# Patient Record
Sex: Female | Born: 1975
Health system: Southern US, Community
[De-identification: ages and names within clinical notes are randomized; demographics above are authoritative.]

## PROBLEM LIST (undated history)

## (undated) DIAGNOSIS — F99 Mental disorder, not otherwise specified: Secondary | ICD-10-CM

## (undated) DIAGNOSIS — Z789 Other specified health status: Secondary | ICD-10-CM

## (undated) DIAGNOSIS — R112 Nausea with vomiting, unspecified: Secondary | ICD-10-CM

## (undated) DIAGNOSIS — O99345 Other mental disorders complicating the puerperium: Secondary | ICD-10-CM

## (undated) DIAGNOSIS — K429 Umbilical hernia without obstruction or gangrene: Secondary | ICD-10-CM

## (undated) DIAGNOSIS — E039 Hypothyroidism, unspecified: Secondary | ICD-10-CM

## (undated) DIAGNOSIS — E785 Hyperlipidemia, unspecified: Secondary | ICD-10-CM

## (undated) DIAGNOSIS — I959 Hypotension, unspecified: Secondary | ICD-10-CM

## (undated) DIAGNOSIS — K219 Gastro-esophageal reflux disease without esophagitis: Secondary | ICD-10-CM

## (undated) DIAGNOSIS — F53 Postpartum depression: Secondary | ICD-10-CM

## (undated) DIAGNOSIS — R7303 Prediabetes: Secondary | ICD-10-CM

## (undated) HISTORY — DX: Gastro-esophageal reflux disease without esophagitis: K21.9

## (undated) HISTORY — PX: NO PAST SURGERIES: SHX2092

## (undated) HISTORY — DX: Hypothyroidism, unspecified: E03.9

## (undated) HISTORY — DX: Hyperlipidemia, unspecified: E78.5

---

## 1999-12-19 ENCOUNTER — Emergency Department (HOSPITAL_COMMUNITY): Admission: EM | Admit: 1999-12-19 | Discharge: 1999-12-20 | Payer: Self-pay | Admitting: Emergency Medicine

## 1999-12-20 ENCOUNTER — Encounter: Payer: Self-pay | Admitting: Emergency Medicine

## 2000-01-14 ENCOUNTER — Other Ambulatory Visit: Admission: RE | Admit: 2000-01-14 | Discharge: 2000-01-14 | Payer: Self-pay | Admitting: Internal Medicine

## 2000-08-16 DIAGNOSIS — F53 Postpartum depression: Secondary | ICD-10-CM

## 2000-08-16 HISTORY — DX: Postpartum depression: F53.0

## 2000-11-09 ENCOUNTER — Inpatient Hospital Stay (HOSPITAL_COMMUNITY): Admission: AD | Admit: 2000-11-09 | Discharge: 2000-11-12 | Payer: Self-pay | Admitting: Obstetrics

## 2002-12-11 ENCOUNTER — Ambulatory Visit (HOSPITAL_COMMUNITY): Admission: RE | Admit: 2002-12-11 | Discharge: 2002-12-11 | Payer: Self-pay | Admitting: Family Medicine

## 2002-12-11 ENCOUNTER — Encounter: Payer: Self-pay | Admitting: Family Medicine

## 2003-04-29 ENCOUNTER — Encounter: Payer: Self-pay | Admitting: Family Medicine

## 2003-04-29 ENCOUNTER — Ambulatory Visit (HOSPITAL_COMMUNITY): Admission: RE | Admit: 2003-04-29 | Discharge: 2003-04-29 | Payer: Self-pay | Admitting: Family Medicine

## 2005-06-01 ENCOUNTER — Emergency Department (HOSPITAL_COMMUNITY): Admission: EM | Admit: 2005-06-01 | Discharge: 2005-06-01 | Payer: Self-pay | Admitting: Emergency Medicine

## 2006-05-26 ENCOUNTER — Ambulatory Visit: Payer: Self-pay | Admitting: Family Medicine

## 2006-06-03 ENCOUNTER — Ambulatory Visit: Payer: Self-pay | Admitting: Family Medicine

## 2006-06-06 ENCOUNTER — Ambulatory Visit (HOSPITAL_COMMUNITY): Admission: RE | Admit: 2006-06-06 | Discharge: 2006-06-06 | Payer: Self-pay | Admitting: Family Medicine

## 2006-06-30 ENCOUNTER — Ambulatory Visit: Payer: Self-pay | Admitting: Sports Medicine

## 2006-07-29 ENCOUNTER — Ambulatory Visit: Payer: Self-pay | Admitting: Sports Medicine

## 2006-08-05 ENCOUNTER — Ambulatory Visit (HOSPITAL_COMMUNITY): Admission: RE | Admit: 2006-08-05 | Discharge: 2006-08-05 | Payer: Self-pay | Admitting: Family Medicine

## 2006-08-23 ENCOUNTER — Ambulatory Visit: Payer: Self-pay | Admitting: Family Medicine

## 2006-10-07 ENCOUNTER — Ambulatory Visit: Payer: Self-pay | Admitting: Family Medicine

## 2006-10-14 ENCOUNTER — Encounter (INDEPENDENT_AMBULATORY_CARE_PROVIDER_SITE_OTHER): Payer: Self-pay | Admitting: Family Medicine

## 2006-10-14 ENCOUNTER — Ambulatory Visit: Payer: Self-pay | Admitting: Family Medicine

## 2006-11-03 ENCOUNTER — Ambulatory Visit: Payer: Self-pay | Admitting: Family Medicine

## 2006-11-03 DIAGNOSIS — R7989 Other specified abnormal findings of blood chemistry: Secondary | ICD-10-CM | POA: Insufficient documentation

## 2006-11-03 LAB — CONVERTED CEMR LAB
Blood in Urine, dipstick: NEGATIVE
Nitrite: NEGATIVE
Specific Gravity, Urine: 1.025
Urobilinogen, UA: 1

## 2006-11-08 ENCOUNTER — Ambulatory Visit: Payer: Self-pay | Admitting: Sports Medicine

## 2006-11-08 LAB — CONVERTED CEMR LAB: Blood Glucose, Fasting: 85 mg/dL

## 2006-11-21 ENCOUNTER — Ambulatory Visit: Payer: Self-pay | Admitting: Family Medicine

## 2006-11-23 ENCOUNTER — Inpatient Hospital Stay (HOSPITAL_COMMUNITY): Admission: AD | Admit: 2006-11-23 | Discharge: 2006-11-23 | Payer: Self-pay | Admitting: Obstetrics & Gynecology

## 2006-11-23 ENCOUNTER — Ambulatory Visit: Payer: Self-pay | Admitting: Obstetrics & Gynecology

## 2006-12-05 ENCOUNTER — Ambulatory Visit: Payer: Self-pay | Admitting: Family Medicine

## 2006-12-20 ENCOUNTER — Encounter: Payer: Self-pay | Admitting: *Deleted

## 2006-12-20 ENCOUNTER — Encounter (INDEPENDENT_AMBULATORY_CARE_PROVIDER_SITE_OTHER): Payer: Self-pay | Admitting: Family Medicine

## 2006-12-20 ENCOUNTER — Ambulatory Visit: Payer: Self-pay

## 2006-12-29 ENCOUNTER — Ambulatory Visit: Payer: Self-pay | Admitting: Sports Medicine

## 2007-01-06 ENCOUNTER — Ambulatory Visit: Payer: Self-pay | Admitting: Family Medicine

## 2007-01-11 ENCOUNTER — Ambulatory Visit: Payer: Self-pay | Admitting: Gynecology

## 2007-01-13 ENCOUNTER — Ambulatory Visit: Payer: Self-pay | Admitting: Family Medicine

## 2007-01-15 ENCOUNTER — Inpatient Hospital Stay (HOSPITAL_COMMUNITY): Admission: AD | Admit: 2007-01-15 | Discharge: 2007-01-17 | Payer: Self-pay | Admitting: Family Medicine

## 2007-01-15 ENCOUNTER — Ambulatory Visit: Payer: Self-pay | Admitting: Family Medicine

## 2007-01-17 ENCOUNTER — Ambulatory Visit: Payer: Self-pay | Admitting: Family Medicine

## 2007-03-02 ENCOUNTER — Ambulatory Visit: Payer: Self-pay | Admitting: Family Medicine

## 2007-03-02 DIAGNOSIS — M545 Low back pain: Secondary | ICD-10-CM | POA: Insufficient documentation

## 2007-03-02 DIAGNOSIS — H538 Other visual disturbances: Secondary | ICD-10-CM

## 2007-03-03 ENCOUNTER — Telehealth: Payer: Self-pay | Admitting: *Deleted

## 2007-03-07 ENCOUNTER — Encounter (INDEPENDENT_AMBULATORY_CARE_PROVIDER_SITE_OTHER): Payer: Self-pay | Admitting: Family Medicine

## 2008-05-31 ENCOUNTER — Ambulatory Visit (HOSPITAL_COMMUNITY): Admission: RE | Admit: 2008-05-31 | Discharge: 2008-05-31 | Payer: Self-pay | Admitting: Family Medicine

## 2008-06-14 ENCOUNTER — Ambulatory Visit (HOSPITAL_COMMUNITY): Admission: RE | Admit: 2008-06-14 | Discharge: 2008-06-14 | Payer: Self-pay | Admitting: Family Medicine

## 2008-09-24 ENCOUNTER — Ambulatory Visit: Payer: Self-pay | Admitting: Family Medicine

## 2008-09-24 ENCOUNTER — Inpatient Hospital Stay (HOSPITAL_COMMUNITY): Admission: AD | Admit: 2008-09-24 | Discharge: 2008-09-26 | Payer: Self-pay | Admitting: Obstetrics & Gynecology

## 2010-02-09 ENCOUNTER — Ambulatory Visit (HOSPITAL_COMMUNITY): Admission: RE | Admit: 2010-02-09 | Discharge: 2010-02-09 | Payer: Self-pay | Admitting: Family Medicine

## 2010-05-11 ENCOUNTER — Ambulatory Visit (HOSPITAL_COMMUNITY): Admission: RE | Admit: 2010-05-11 | Discharge: 2010-05-11 | Payer: Self-pay | Admitting: Family Medicine

## 2010-06-25 ENCOUNTER — Inpatient Hospital Stay (HOSPITAL_COMMUNITY): Admission: AD | Admit: 2010-06-25 | Discharge: 2010-06-27 | Payer: Self-pay | Admitting: Obstetrics & Gynecology

## 2010-06-25 ENCOUNTER — Ambulatory Visit: Payer: Self-pay | Admitting: Obstetrics and Gynecology

## 2010-10-02 ENCOUNTER — Encounter: Payer: Self-pay | Admitting: *Deleted

## 2010-10-27 LAB — CBC
HCT: 40.8 % (ref 36.0–46.0)
Hemoglobin: 11.3 g/dL — ABNORMAL LOW (ref 12.0–15.0)
MCH: 31.8 pg (ref 26.0–34.0)
MCV: 92.6 fL (ref 78.0–100.0)
Platelets: 212 10*3/uL (ref 150–400)
RBC: 3.54 MIL/uL — ABNORMAL LOW (ref 3.87–5.11)
RBC: 4.4 MIL/uL (ref 3.87–5.11)
WBC: 11.3 10*3/uL — ABNORMAL HIGH (ref 4.0–10.5)
WBC: 13.3 10*3/uL — ABNORMAL HIGH (ref 4.0–10.5)

## 2010-12-01 LAB — CBC
HCT: 34.5 % — ABNORMAL LOW (ref 36.0–46.0)
HCT: 39.6 % (ref 36.0–46.0)
Hemoglobin: 11.7 g/dL — ABNORMAL LOW (ref 12.0–15.0)
Hemoglobin: 13.3 g/dL (ref 12.0–15.0)
MCHC: 34 g/dL (ref 30.0–36.0)
MCV: 92.8 fL (ref 78.0–100.0)
MCV: 93.6 fL (ref 78.0–100.0)
Platelets: 236 10*3/uL (ref 150–400)
RBC: 3.69 MIL/uL — ABNORMAL LOW (ref 3.87–5.11)
RBC: 4.27 MIL/uL (ref 3.87–5.11)
RDW: 14.1 % (ref 11.5–15.5)
WBC: 10.4 10*3/uL (ref 4.0–10.5)

## 2011-02-24 ENCOUNTER — Emergency Department (HOSPITAL_COMMUNITY)
Admission: EM | Admit: 2011-02-24 | Discharge: 2011-02-24 | Disposition: A | Discharge status: Home / Self Care | Payer: Self-pay | Attending: Emergency Medicine | Admitting: Emergency Medicine

## 2011-02-24 ENCOUNTER — Emergency Department (HOSPITAL_COMMUNITY): Payer: Self-pay

## 2011-02-24 DIAGNOSIS — K219 Gastro-esophageal reflux disease without esophagitis: Secondary | ICD-10-CM | POA: Insufficient documentation

## 2011-02-24 DIAGNOSIS — R109 Unspecified abdominal pain: Secondary | ICD-10-CM | POA: Insufficient documentation

## 2011-02-24 LAB — URINALYSIS, ROUTINE W REFLEX MICROSCOPIC
Bilirubin Urine: NEGATIVE
Ketones, ur: NEGATIVE mg/dL
Protein, ur: NEGATIVE mg/dL
pH: 6.5 (ref 5.0–8.0)

## 2011-02-24 LAB — BASIC METABOLIC PANEL
BUN: 9 mg/dL (ref 6–23)
Calcium: 9 mg/dL (ref 8.4–10.5)
Creatinine, Ser: 0.7 mg/dL (ref 0.50–1.10)
GFR calc Af Amer: >60 mL/min (ref >60)

## 2011-02-24 LAB — CBC
MCH: 30 pg (ref 26.0–34.0)
MCHC: 34.8 g/dL (ref 30.0–36.0)
MCV: 86 fL (ref 78.0–100.0)
Platelets: 326 10*3/uL (ref 150–400)
RDW: 13.2 % (ref 11.5–15.5)

## 2011-02-24 LAB — WET PREP, GENITAL
Clue Cells Wet Prep HPF POC: NONE SEEN
Trich, Wet Prep: NONE SEEN
Yeast Wet Prep HPF POC: NONE SEEN

## 2011-02-24 LAB — URINE MICROSCOPIC-ADD ON

## 2011-02-24 LAB — POCT PREGNANCY, URINE: Preg Test, Ur: NEGATIVE

## 2011-02-24 MED ORDER — IOHEXOL 300 MG/ML  SOLN: 80.0000 mL | Freq: Once | INTRAMUSCULAR | Status: DC | PRN

## 2011-02-25 LAB — GC/CHLAMYDIA PROBE AMP, GENITAL: Chlamydia, DNA Probe: NEGATIVE

## 2011-02-25 LAB — URINE CULTURE
Colony Count: NO GROWTH
Culture  Setup Time: 201207111228
Culture: NO GROWTH

## 2011-06-03 LAB — CBC
MCV: 90
Platelets: 297
WBC: 10.7 — ABNORMAL HIGH

## 2011-06-03 LAB — RPR: RPR Ser Ql: NONREACTIVE

## 2012-10-06 LAB — OB RESULTS CONSOLE GC/CHLAMYDIA: Chlamydia: NEGATIVE

## 2012-11-21 LAB — OB RESULTS CONSOLE HEPATITIS B SURFACE ANTIGEN
Hepatitis B Surface Ag: NEGATIVE
Hepatitis B Surface Ag: NEGATIVE

## 2012-11-21 LAB — OB RESULTS CONSOLE ABO/RH
RH Type: POSITIVE
RH Type: POSITIVE

## 2012-11-21 LAB — OB RESULTS CONSOLE RUBELLA ANTIBODY, IGM: Rubella: IMMUNE

## 2012-11-21 LAB — OB RESULTS CONSOLE ANTIBODY SCREEN
Antibody Screen: NEGATIVE
Antibody Screen: NEGATIVE

## 2012-11-21 LAB — OB RESULTS CONSOLE HIV ANTIBODY (ROUTINE TESTING): HIV: NONREACTIVE

## 2012-11-21 LAB — OB RESULTS CONSOLE RPR: RPR: NONREACTIVE

## 2013-03-05 LAB — OB RESULTS CONSOLE GBS: GBS: NEGATIVE

## 2013-03-19 ENCOUNTER — Other Ambulatory Visit (HOSPITAL_COMMUNITY): Payer: Self-pay | Admitting: Physician Assistant

## 2013-03-19 DIAGNOSIS — O3663X1 Maternal care for excessive fetal growth, third trimester, fetus 1: Secondary | ICD-10-CM

## 2013-03-20 ENCOUNTER — Ambulatory Visit (HOSPITAL_COMMUNITY)
Admission: RE | Admit: 2013-03-20 | Discharge: 2013-03-20 | Disposition: A | Discharge status: Home / Self Care | Payer: Self-pay | Source: Ambulatory Visit | Attending: Physician Assistant | Admitting: Physician Assistant

## 2013-03-20 DIAGNOSIS — O3660X0 Maternal care for excessive fetal growth, unspecified trimester, not applicable or unspecified: Secondary | ICD-10-CM | POA: Insufficient documentation

## 2013-03-20 DIAGNOSIS — O3663X1 Maternal care for excessive fetal growth, third trimester, fetus 1: Secondary | ICD-10-CM

## 2013-03-20 DIAGNOSIS — Z3689 Encounter for other specified antenatal screening: Secondary | ICD-10-CM | POA: Insufficient documentation

## 2013-03-20 DIAGNOSIS — O09529 Supervision of elderly multigravida, unspecified trimester: Secondary | ICD-10-CM | POA: Insufficient documentation

## 2013-03-22 ENCOUNTER — Inpatient Hospital Stay (HOSPITAL_COMMUNITY)
Admission: AD | Admit: 2013-03-22 | Discharge: 2013-03-23 | DRG: 775 | Disposition: A | Discharge status: Home / Self Care | Payer: Medicaid Other | Source: Ambulatory Visit | Attending: Obstetrics & Gynecology | Admitting: Obstetrics & Gynecology

## 2013-03-22 ENCOUNTER — Encounter (HOSPITAL_COMMUNITY): Payer: Self-pay | Admitting: *Deleted

## 2013-03-22 DIAGNOSIS — O09529 Supervision of elderly multigravida, unspecified trimester: Secondary | ICD-10-CM | POA: Diagnosis present

## 2013-03-22 DIAGNOSIS — O3660X0 Maternal care for excessive fetal growth, unspecified trimester, not applicable or unspecified: Secondary | ICD-10-CM

## 2013-03-22 HISTORY — DX: Postpartum depression: F53.0

## 2013-03-22 HISTORY — DX: Other specified health status: Z78.9

## 2013-03-22 HISTORY — DX: Mental disorder, not otherwise specified: F99

## 2013-03-22 HISTORY — DX: Other mental disorders complicating the puerperium: O99.345

## 2013-03-22 LAB — CBC
MCH: 30.4 pg (ref 26.0–34.0)
MCHC: 34.5 g/dL (ref 30.0–36.0)
Platelets: 231 10*3/uL (ref 150–400)
RDW: 14.1 % (ref 11.5–15.5)

## 2013-03-22 LAB — ABO/RH: ABO/RH(D): O POS

## 2013-03-22 LAB — TYPE AND SCREEN
ABO/RH(D): O POS
Antibody Screen: NEGATIVE

## 2013-03-22 LAB — RPR: RPR Ser Ql: NONREACTIVE

## 2013-03-22 MED ORDER — TETANUS-DIPHTH-ACELL PERTUSSIS 5-2.5-18.5 LF-MCG/0.5 IM SUSP: 0.5000 mL | Freq: Once | INTRAMUSCULAR | Status: DC

## 2013-03-22 MED ORDER — OXYCODONE-ACETAMINOPHEN 5-325 MG PO TABS: 1.0000 | ORAL_TABLET | ORAL | Status: DC | PRN

## 2013-03-22 MED ORDER — CITRIC ACID-SODIUM CITRATE 334-500 MG/5ML PO SOLN: 30.0000 mL | ORAL | Status: DC | PRN

## 2013-03-22 MED ORDER — OXYTOCIN BOLUS FROM INFUSION: 500.0000 mL | INTRAVENOUS | Status: DC

## 2013-03-22 MED ORDER — FLEET ENEMA 7-19 GM/118ML RE ENEM: 1.0000 | ENEMA | RECTAL | Status: DC | PRN

## 2013-03-22 MED ORDER — LACTATED RINGERS IV SOLN: 500.0000 mL | INTRAVENOUS | Status: DC | PRN

## 2013-03-22 MED ORDER — DIPHENHYDRAMINE HCL 25 MG PO CAPS: 25.0000 mg | ORAL_CAPSULE | Freq: Four times a day (QID) | ORAL | Status: DC | PRN

## 2013-03-22 MED ORDER — LIDOCAINE HCL (PF) 1 % IJ SOLN
30.0000 mL | INTRAMUSCULAR | Status: DC | PRN
  Filled 2013-03-22 (×2): qty 30

## 2013-03-22 MED ORDER — IBUPROFEN 600 MG PO TABS
600.0000 mg | ORAL_TABLET | Freq: Four times a day (QID) | ORAL | Status: DC | PRN
  Filled 2013-03-22 (×5): qty 1

## 2013-03-22 MED ORDER — IBUPROFEN 600 MG PO TABS
600.0000 mg | ORAL_TABLET | Freq: Four times a day (QID) | ORAL | Status: DC
  Administered 2013-03-22 – 2013-03-23 (×5): 600 mg via ORAL

## 2013-03-22 MED ORDER — OXYTOCIN 40 UNITS IN LACTATED RINGERS INFUSION - SIMPLE MED
62.5000 mL/h | INTRAVENOUS | Status: DC
  Administered 2013-03-22: 62.5 mL/h via INTRAVENOUS
  Filled 2013-03-22: qty 1000

## 2013-03-22 MED ORDER — SENNOSIDES-DOCUSATE SODIUM 8.6-50 MG PO TABS
2.0000 | ORAL_TABLET | Freq: Every day | ORAL | Status: DC
  Administered 2013-03-22: 2 via ORAL

## 2013-03-22 MED ORDER — OXYCODONE-ACETAMINOPHEN 5-325 MG PO TABS
1.0000 | ORAL_TABLET | ORAL | Status: DC | PRN
Start: 2013-03-22 — End: 2013-03-23

## 2013-03-22 MED ORDER — ONDANSETRON HCL 4 MG/2ML IJ SOLN: 4.0000 mg | INTRAMUSCULAR | Status: DC | PRN

## 2013-03-22 MED ORDER — LANOLIN HYDROUS EX OINT: TOPICAL_OINTMENT | CUTANEOUS | Status: DC | PRN

## 2013-03-22 MED ORDER — ACETAMINOPHEN 325 MG PO TABS: 650.0000 mg | ORAL_TABLET | ORAL | Status: DC | PRN

## 2013-03-22 MED ORDER — LACTATED RINGERS IV SOLN
INTRAVENOUS | Status: DC
  Administered 2013-03-22: 07:00:00 via INTRAVENOUS

## 2013-03-22 MED ORDER — ONDANSETRON HCL 4 MG/2ML IJ SOLN: 4.0000 mg | Freq: Four times a day (QID) | INTRAMUSCULAR | Status: DC | PRN

## 2013-03-22 MED ORDER — ONDANSETRON HCL 4 MG PO TABS: 4.0000 mg | ORAL_TABLET | ORAL | Status: DC | PRN

## 2013-03-22 MED ORDER — WITCH HAZEL-GLYCERIN EX PADS: 1.0000 "application " | MEDICATED_PAD | CUTANEOUS | Status: DC | PRN

## 2013-03-22 MED ORDER — PRENATAL MULTIVITAMIN CH
1.0000 | ORAL_TABLET | Freq: Every day | ORAL | Status: DC
  Administered 2013-03-22 – 2013-03-23 (×2): 1 via ORAL
  Filled 2013-03-22 (×2): qty 1

## 2013-03-22 MED ORDER — SIMETHICONE 80 MG PO CHEW: 80.0000 mg | CHEWABLE_TABLET | ORAL | Status: DC | PRN

## 2013-03-22 MED ORDER — BENZOCAINE-MENTHOL 20-0.5 % EX AERO
1.0000 "application " | INHALATION_SPRAY | CUTANEOUS | Status: DC | PRN
  Administered 2013-03-22: 1 via TOPICAL
  Filled 2013-03-22: qty 56

## 2013-03-22 MED ORDER — DIBUCAINE 1 % RE OINT: 1.0000 "application " | TOPICAL_OINTMENT | RECTAL | Status: DC | PRN

## 2013-03-22 MED ORDER — ZOLPIDEM TARTRATE 5 MG PO TABS: 5.0000 mg | ORAL_TABLET | Freq: Every evening | ORAL | Status: DC | PRN

## 2013-03-22 NOTE — H&P (Signed)
Courtney Edwards is a 37 y.o. female [phone] with IUP at [redacted]w[redacted]d by LMP consistent with a 3rd trimester [place] presenting for labor. Pt states she has been having regular, every 2 minutes contractions for the last few hours, associated with none vaginal bleeding.  Membranes are intact, with active fetal movement.     [facility] at [facility] since 18 wks - 1 hour gtt: 124 - GBS neg - labs neg, rubella Immune - US done @ [redacted]w[redacted]d- posterior placenta, vertex, AFI WNL, normal limited anatomy, EFW 4600gm.   Prenatal History/Complications: 3rd pregnancy- had some retained products and had a pphemorrhage but did not require transfusion   Past Medical History: No past medical history on file.  Past Surgical History: No past surgical history on file.  Obstetrical History: OB History   Grav Para Term Preterm Abortions TAB SAB Ect Mult Living   1               Social History: History   Social History  . Marital Status: Single    Spouse Name: N/A    Number of Children: N/A  . Years of Education: N/A   Social History Main Topics  . Smoking status: Not on file  . Smokeless tobacco: Not on file  . Alcohol Use: Not on file  . Drug Use: Not on file  . Sexually Active: Not on file   Other Topics Concern  . Not on file   Social History Narrative  . No narrative on file    Family History: No family history on file.  Allergies: No Known Allergies  Prescriptions prior to admission  Medication Sig Dispense Refill  . prenatal vitamin w/FE, FA (PRENATAL 1 + 1) 27-1 MG TABS Take 1 tablet by mouth daily.        . ranitidine (ZANTAC) 150 MG capsule Take 150 mg by mouth 2 (two) times daily.           Review of Systems   Constitutional: Negative for fever, chills, weight loss, malaise/fatigue and diaphoresis.  HENT: Negative for hearing loss, ear pain, nosebleeds, congestion, sore throat, neck pain, tinnitus and ear discharge.   Eyes: Negative for blurred vision, double vision,  photophobia, pain, discharge and redness.  Respiratory: Negative for cough, hemoptysis, sputum production, shortness of breath, wheezing and stridor.   Cardiovascular: Negative for chest pain, palpitations, orthopnea,  leg swelling  Gastrointestinal: Positive for abdominal pain. Negative for heartburn, nausea, vomiting, diarrhea, constipation, blood in stool Genitourinary: Negative for dysuria, urgency, frequency, hematuria and flank pain.  Musculoskeletal: Negative for myalgias, back pain, joint pain and falls.  Skin: Negative for itching and rash.  Neurological: Negative for dizziness, tingling, tremors, sensory change, speech change, focal weakness, seizures, loss of consciousness, weakness and headaches.  Endo/Heme/Allergies: Negative for environmental allergies and polydipsia. Does not bruise/bleed easily.  Psychiatric/Behavioral: Negative for depression, suicidal ideas, hallucinations, memory loss and substance abuse. The patient is not nervous/anxious and does not have insomnia.       Temperature 97.9 F (36.6 C), resp. rate 22. General appearance: alert, cooperative and mild distress Lungs: clear to auscultation bilaterally Heart: regular rate and rhythm Abdomen: soft, non-tender; bowel sounds normal Pelvic: SVE: 5/90/-1 per nursing  Extremities: Homans sign is negative, no sign of DVT Presentation: cephalic Fetal monitoringBaseline: 130 bpm, Variability: Good {> 6 bpm), Accelerations: Reactive and Decelerations: Absent Uterine activity every 1-35min      Prenatal labs: ABO, Rh:   Antibody:   Rubella:   RPR:  HBsAg:    HIV:    GBS:    1 hr Glucola 124 Genetic screening  declined Anatomy US see above- limited anatomy due to advanced GA but normal. EFW >90%ile   Assessment: Courtney Edwards is a 37 y.o. G1P0 with an IUP at [redacted]w[redacted]d presenting for active labor  Plan: 1) admit to L&D - routine admit orders - epidural prn - AROM performed per pt birth plan  with return of clear fluid.   2) FWB - cat I tracing - GBS neg  3) Dispo Anticipate SVD. Pt with LGA baby on [place] approx 10lb4oz.  No hx of diabetes and thus no indication for c/s.  Pt has a proven pelvis up to almost 9 lbs.     Vale Haven, MD 03/22/2013, 6:33 AM

## 2013-03-23 MED ORDER — IBUPROFEN 600 MG PO TABS: 600.0000 mg | ORAL_TABLET | Freq: Four times a day (QID) | ORAL | Status: DC

## 2013-03-23 NOTE — Progress Notes (Signed)
UR chart review completed.  

## 2013-03-23 NOTE — Discharge Summary (Signed)
Obstetric Discharge Summary Reason for Admission: onset of labor Prenatal Procedures: none Intrapartum Procedures: spontaneous vaginal delivery and shoulder dystocia Postpartum Procedures: none Complications-Operative and Postpartum: none  Breast feeding and vasectomy for contraception  Hemoglobin  Date Value Range Status  03/22/2013 13.2  12.0 - 15.0 g/dL Final     HCT  Date Value Range Status  03/22/2013 38.3  36.0 - 46.0 % Final    Physical Exam:  General: alert, cooperative and no distress Lochia: appropriate Uterine Fundus: firm DVT Evaluation: Negative Homan's sign. No cords or calf tenderness. Calf/Ankle edema is present.  Discharge Diagnoses: Term Pregnancy-delivered  Discharge Information: Date: 03/23/2013 Activity: pelvic rest Diet: routine Medications: PNV and Ibuprofen Condition: stable Instructions: refer to practice specific booklet Discharge to: home Follow-up Information   Follow up with [facility] [place]. Schedule an appointment as soon as possible for a visit in 4 weeks. (Call to make an appointment for 4-6 weeks from now)    Contact information:   [address] [place] [address] [phone]      Newborn Data: Live born female  Birth Weight: 10 lb 6.3 oz (4715 g) APGAR: 5, 8  Home with mother.  Courtney Edwards 03/23/2013, 7:46 AM  I have seen and examined this patient and I agree with the above. See L&D note for delivery specifics. Cam Hai 8:02 AM 03/23/2013

## 2013-08-06 ENCOUNTER — Ambulatory Visit: Payer: Medicaid Other | Attending: Internal Medicine | Admitting: Internal Medicine

## 2013-08-06 ENCOUNTER — Encounter: Payer: Self-pay | Admitting: Internal Medicine

## 2013-08-06 VITALS — BP 101/65 | HR 63 | Temp 98.1°F | Resp 16 | Wt 167.2 lb

## 2013-08-06 DIAGNOSIS — M791 Myalgia, unspecified site: Secondary | ICD-10-CM

## 2013-08-06 DIAGNOSIS — H539 Unspecified visual disturbance: Secondary | ICD-10-CM

## 2013-08-06 DIAGNOSIS — Z23 Encounter for immunization: Secondary | ICD-10-CM

## 2013-08-06 DIAGNOSIS — K649 Unspecified hemorrhoids: Secondary | ICD-10-CM

## 2013-08-06 DIAGNOSIS — IMO0001 Reserved for inherently not codable concepts without codable children: Secondary | ICD-10-CM

## 2013-08-06 DIAGNOSIS — K219 Gastro-esophageal reflux disease without esophagitis: Secondary | ICD-10-CM

## 2013-08-06 DIAGNOSIS — Z139 Encounter for screening, unspecified: Secondary | ICD-10-CM

## 2013-08-06 DIAGNOSIS — B351 Tinea unguium: Secondary | ICD-10-CM | POA: Insufficient documentation

## 2013-08-06 DIAGNOSIS — K5900 Constipation, unspecified: Secondary | ICD-10-CM

## 2013-08-06 LAB — COMPLETE METABOLIC PANEL WITH GFR
ALT: 61 U/L — ABNORMAL HIGH (ref 0–35)
AST: 35 U/L (ref 0–37)
Albumin: 4.4 g/dL (ref 3.5–5.2)
Alkaline Phosphatase: 105 U/L (ref 39–117)
Potassium: 4.3 mEq/L (ref 3.5–5.3)
Sodium: 141 mEq/L (ref 135–145)
Total Protein: 7.3 g/dL (ref 6.0–8.3)

## 2013-08-06 LAB — CBC WITH DIFFERENTIAL/PLATELET
Basophils Absolute: 0 10*3/uL (ref 0.0–0.1)
Basophils Relative: 0 % (ref 0–1)
Hemoglobin: 13.4 g/dL (ref 12.0–15.0)
MCHC: 34.5 g/dL (ref 30.0–36.0)
Monocytes Relative: 9 % (ref 3–12)
Neutro Abs: 3.9 10*3/uL (ref 1.7–7.7)
Neutrophils Relative %: 50 % (ref 43–77)

## 2013-08-06 MED ORDER — MAGNESIUM HYDROXIDE 400 MG/5ML PO SUSP: 5.0000 mL | Freq: Every day | ORAL | Status: DC | PRN

## 2013-08-06 MED ORDER — TERBINAFINE HCL 1 % EX CREA: 1.0000 "application " | TOPICAL_CREAM | Freq: Two times a day (BID) | CUTANEOUS | Status: DC

## 2013-08-06 MED ORDER — HYDROCORTISONE ACETATE 25 MG RE SUPP: 25.0000 mg | Freq: Two times a day (BID) | RECTAL | Status: DC

## 2013-08-06 MED ORDER — FAMOTIDINE 20 MG PO TABS: 20.0000 mg | ORAL_TABLET | Freq: Every day | ORAL | Status: DC

## 2013-08-06 NOTE — Progress Notes (Signed)
Patient here to establish care Complains of pain to left shoulder blade area Has hemorrhoids but no using anything for them Recently had a child that she is breastfeeding so is not having her monthly period At this time

## 2013-08-06 NOTE — Progress Notes (Signed)
Patient Demographics  Courtney Edwards, is a 37 y.o. female  OZH:[id number]  GEX:[id number]  DOB - 1975/08/24  CC:  Chief Complaint  Patient presents with  . Establish Care       HPI: Courtney Edwards is a 37 y.o. female here today to establish medical care.patient has history of hemorrhoids not taking any medication, also complaining of constipation, she reported to have some pain on her left upper back denies any fall or trauma denies any fever chills any problem moving the arms, she also mentioned having upper abdominal burning sensation/GERD symptoms has not taken any medication, patient had vomited 2 months ago denies any currently, she had given birth 4 months ago, has not seen her gynecologist has amenorrheasecondary to breast-feeding.patient also reported to have on and off problem with her vision which affects reading as well. Patient has No headache, No chest pain, No abdominal pain - No Nausea, No new weakness tingling or numbness, No Cough - SOB.  No Known Allergies Past Medical History  Diagnosis Date  . Medical history non-contributory   . Mental disorder   . Post partum depression 2002   Current Outpatient Prescriptions on File Prior to Visit  Medication Sig Dispense Refill  . ibuprofen (ADVIL,MOTRIN) 600 MG tablet Take 1 tablet (600 mg total) by mouth every 6 (six) hours.  30 tablet  0  . Prenatal Vit-Fe Fumarate-FA (PRENATAL MULTIVITAMIN) TABS tablet Take 1 tablet by mouth daily at 12 noon.       No current facility-administered medications on file prior to visit.   Family History  Problem Relation Age of Onset  . Hypertension Mother   . Diabetes Mother   . Heart disease Mother    History   Social History  . Marital Status: Single    Spouse Name: N/A    Number of Children: N/A  . Years of Education: N/A   Occupational History  . Not on file.   Social History Main Topics  . Smoking status: Never Smoker   . Smokeless tobacco: Not on file  . Alcohol  Use: No  . Drug Use: No  . Sexual Activity: No   Other Topics Concern  . Not on file   Social History Narrative  . No narrative on file    Review of Systems: Constitutional: Negative for fever, chills, diaphoresis, activity change, appetite change and fatigue. HENT: Negative for ear pain, nosebleeds, congestion, facial swelling, rhinorrhea, neck pain, neck stiffness and ear discharge.  Eyes: Negative for pain, discharge, redness, itching and positivevisual disturbance reading  problem. Respiratory: Negative for cough, choking, chest tightness, shortness of breath, wheezing and stridor.  Cardiovascular: Negative for chest pain, palpitations and leg swelling. Gastrointestinal: Negative for abdominal distention.positive for burning Genitourinary: Negative for dysuria, urgency, frequency, hematuria, flank pain, decreased urine volume, difficulty urinating  Musculoskeletal: positive for upperback pain, negative for joint swelling, arthralgia and gait problem. Neurological: Negative for dizziness, tremors, seizures, syncope, facial asymmetry, speech difficulty, weakness, light-headedness, numbness and headaches.  Hematological: Negative for adenopathy. Does not bruise/bleed easily. Psychiatric/Behavioral: Negative for hallucinations, behavioral problems, confusion, dysphoric mood, decreased concentration and agitation.    Objective:   Filed Vitals:   08/06/13 1132  BP: 101/65  Pulse: 63  Temp: 98.1 F (36.7 C)  Resp: 16    Physical Exam: Constitutional: Patient appears well-developed and well-nourished. No distress. HENT: Normocephalic, atraumatic, External right and left ear normal. Oropharynx is clear and moist.  Eyes: Conjunctivae and EOM are normal. PERRLA, no scleral icterus. Neck: Normal  ROM. Neck supple. No JVD. No tracheal deviation. No thyromegaly. CVS: RRR, S1/S2 +, no murmurs, no gallops, no carotid bruit.  Pulmonary: Effort and breath sounds normal, no stridor,  rhonchi, wheezes, rales.  Abdominal: Soft. BS +, no distension, minimal epigastric tenderness, no rebound or guarding.  Musculoskeletal: Normal range of motion. No edema , positive tenderness left upper side back, left shoulder good range of motion equal strength upper extremities. Neuro: Alert. Normal reflexes, muscle tone coordination. No cranial nerve deficit. Skin: Skin is warm and dry. Yellowish Discolored nails. Psychiatric: Normal mood and affect. Behavior, judgment, thought content normal.  Lab Results  Component Value Date   WBC 10.5 03/22/2013   HGB 13.2 03/22/2013   HCT 38.3 03/22/2013   MCV 88.2 03/22/2013   PLT 231 03/22/2013   Lab Results  Component Value Date   CREATININE 0.70 02/24/2011   BUN 9 02/24/2011   NA 140 02/24/2011   K 3.6 02/24/2011   CL 105 02/24/2011   CO2 26 02/24/2011    No results found for this basename: HGBA1C   Lipid Panel  No results found for this basename: chol,  trig,  hdl,  cholhdl,  vldl,  ldlcalc       Assessment and plan:   1. Unspecified constipation Increased fiber in the diet.  - magnesium hydroxide (MILK OF MAGNESIA) 400 MG/5ML suspension; Take 5 mLs by mouth daily as needed for mild constipation.  Dispense: 360 mL; Refill: 1 As needed  2. Onychomycosis  - terbinafine (LAMISIL AT) 1 % cream; Apply 1 application topically 2 (two) times daily.  Dispense: 30 g; Refill: 0  3. Hemorrhoid  - hydrocortisone (ANUSOL-HC) 25 MG suppository; Place 1 suppository (25 mg total) rectally 2 (two) times daily.  Dispense: 12 suppository; Refill: 0  4. Unspecified visual disturbance  - Ambulatory referral to Ophthalmology  5. GERD (gastroesophageal reflux disease)  - famotidine (PEPCID) 20 MG tablet; Take 1 tablet (20 mg total) by mouth daily.  Dispense: 30 tablet; Refill: 3  6. Muscle ache Advised for heating pad tylenol prn   7. Screening  - CBC with Differential - COMPLETE METABOLIC PANEL WITH GFR - TSH - Vit D  25 hydroxy (rtn  osteoporosis monitoring) - Ambulatory referral to Gynecology        Health Maintenance -Pap Smear: reffered to GYN   Return in about 6 weeks (around 09/17/2013).    Doris Cheadle, MD

## 2013-09-17 ENCOUNTER — Ambulatory Visit: Payer: Medicaid Other | Attending: Internal Medicine | Admitting: Internal Medicine

## 2013-12-24 ENCOUNTER — Ambulatory Visit: Payer: Medicaid Other | Admitting: Internal Medicine

## 2013-12-27 ENCOUNTER — Ambulatory Visit: Payer: No Typology Code available for payment source | Attending: Internal Medicine

## 2014-02-04 ENCOUNTER — Ambulatory Visit: Payer: Medicaid Other | Admitting: Internal Medicine

## 2014-03-07 ENCOUNTER — Ambulatory Visit: Payer: No Typology Code available for payment source | Attending: Internal Medicine | Admitting: Internal Medicine

## 2014-03-07 ENCOUNTER — Encounter: Payer: Self-pay | Admitting: Internal Medicine

## 2014-03-07 VITALS — BP 92/60 | HR 74 | Temp 98.2°F | Resp 16 | Ht 64.75 in | Wt 168.2 lb

## 2014-03-07 DIAGNOSIS — K649 Unspecified hemorrhoids: Secondary | ICD-10-CM

## 2014-03-07 DIAGNOSIS — K5900 Constipation, unspecified: Secondary | ICD-10-CM

## 2014-03-07 DIAGNOSIS — B351 Tinea unguium: Secondary | ICD-10-CM | POA: Insufficient documentation

## 2014-03-07 MED ORDER — HYDROCORTISONE ACETATE 25 MG RE SUPP: 25.0000 mg | Freq: Two times a day (BID) | RECTAL | Status: DC

## 2014-03-07 MED ORDER — POLYETHYLENE GLYCOL 3350 17 GM/SCOOP PO POWD
17.0000 g | Freq: Every day | ORAL | Status: DC
Start: 2014-03-07 — End: 2015-01-27

## 2014-03-07 MED ORDER — TERBINAFINE HCL 1 % EX CREA: 1.0000 "application " | TOPICAL_CREAM | Freq: Two times a day (BID) | CUTANEOUS | Status: DC

## 2014-03-07 NOTE — Progress Notes (Signed)
Patient presents for hemorrhoids for 8 months States anal itching for 8 months Also c/o hand nail fungus for  States having painful intercourse with bleeding Interpreter present

## 2014-03-07 NOTE — Progress Notes (Signed)
Patient ID: Courtney AllegraMiriam Leticis Constancia [name], female   DOB: 02/24/1976, 38 y.o.   MRN: [id number]  CC: Hemorrhoids  HPI:  Patient presents today for evaluation of hemorrhoids and fingernail fungus.  Patient reports that she was told she had hemorrhoids when she was pregnant and was using sitz baths and anusol suppositories which helps.  She now reports anal itching and pain with sitting. She c/o of fingernail fungus for three years.  She reports that her fingers itch and they are painful.  Patient reports that she has pain with intercourse and feels that it may be due to vaginal dryness.  She states that she feels like when he penetrates her it hurts her insides and feels like it pushes into her abdomin.    No Known Allergies Past Medical History  Diagnosis Date  . Medical history non-contributory   . Mental disorder   . Post partum depression 2002   Current Outpatient Prescriptions on File Prior to Visit  Medication Sig Dispense Refill  . Prenatal Vit-Fe Fumarate-FA (PRENATAL MULTIVITAMIN) TABS tablet Take 1 tablet by mouth daily at 12 noon.      . famotidine (PEPCID) 20 MG tablet Take 1 tablet (20 mg total) by mouth daily.  30 tablet  3  . hydrocortisone (ANUSOL-HC) 25 MG suppository Place 1 suppository (25 mg total) rectally 2 (two) times daily.  12 suppository  0  . ibuprofen (ADVIL,MOTRIN) 600 MG tablet Take 1 tablet (600 mg total) by mouth every 6 (six) hours.  30 tablet  0  . magnesium hydroxide (MILK OF MAGNESIA) 400 MG/5ML suspension Take 5 mLs by mouth daily as needed for mild constipation.  360 mL  1  . terbinafine (LAMISIL AT) 1 % cream Apply 1 application topically 2 (two) times daily.  30 g  0   No current facility-administered medications on file prior to visit.   Family History  Problem Relation Age of Onset  . Hypertension Mother   . Diabetes Mother   . Heart disease Mother    History   Social History  . Marital Status: Single    Spouse Name: N/A    Number of Children:  N/A  . Years of Education: N/A   Occupational History  . Not on file.   Social History Main Topics  . Smoking status: Never Smoker   . Smokeless tobacco: Not on file  . Alcohol Use: No  . Drug Use: No  . Sexual Activity: No   Other Topics Concern  . Not on file   Social History Narrative  . No narrative on file    Review of Systems: See HPI   Objective:   Filed Vitals:   03/07/14 1159  BP: 92/60  Pulse: 74  Temp: 98.2 F (36.8 C)  Resp: 16    Physical Exam: Constitutional: Patient appears well-developed and well-nourished. No distress. HENT: Normocephalic, atraumatic, External right and left ear normal. Oropharynx is clear and moist.  Eyes: Conjunctivae and EOM are normal. PERRLA, no scleral icterus. Neck: Normal ROM. Neck supple. No JVD. No tracheal deviation. No thyromegaly. CVS: RRR, S1/S2 +, no murmurs, no gallops, no carotid bruit.  Pulmonary: Effort and breath sounds normal, no stridor, rhonchi, wheezes, rales.  Abdominal: Soft. BS +,  no distension, tenderness, rebound or guarding.  Musculoskeletal: Normal range of motion. No edema and no tenderness.  Lymphadenopathy: No lymphadenopathy noted, cervical, inguinal or axillary Neuro: Alert. Normal reflexes, muscle tone coordination. No cranial nerve deficit. Skin: Skin is warm and dry. No  rash noted. Not diaphoretic. No erythema. No pallor. Fungus present on left thumb and two fingers on right hand. Nail is wrinkled and weak.   Psychiatric: Normal mood and affect. Behavior, judgment, thought content normal.  Lab Results  Component Value Date   WBC 7.8 08/06/2013   HGB 13.4 08/06/2013   HCT 38.8 08/06/2013   MCV 89.0 08/06/2013   PLT 414* 08/06/2013   Lab Results  Component Value Date   CREATININE 0.59 08/06/2013   BUN 11 08/06/2013   NA 141 08/06/2013   K 4.3 08/06/2013   CL 106 08/06/2013   CO2 25 08/06/2013    No results found for this basename: HGBA1C   Lipid Panel  No results found for  this basename: chol, trig, hdl, cholhdl, vldl, ldlcalc       Assessment and plan:   Alyza was seen today for hemorrhoids.  Diagnoses and associated orders for this visit:  Hemorrhoids, unspecified hemorrhoid type - hydrocortisone (ANUSOL-HC) 25 MG suppository; Place 1 suppository (25 mg total) rectally 2 (two) times daily.  Unspecified constipation - polyethylene glycol powder (GLYCOLAX/MIRALAX) powder; Take 17 g by mouth daily. Explained to patient that lubrication may help with vaginal dryness  Onychomycosis - Lipid panel; Future - terbinafine (LAMISIL AT) 1 % cream; Apply 1 application topically 2 (two) times daily. Explained to patient that topical treatment may take longer but she cannot have oral therapy because it is contraindicated with breastfeeding.    Return if symptoms worsen or fail to improve.       Holland Commons, NP-C [facility] [phone] 03/07/2014, 12:20 PM

## 2014-03-07 NOTE — Patient Instructions (Signed)

## 2014-03-08 ENCOUNTER — Ambulatory Visit: Payer: No Typology Code available for payment source | Attending: Internal Medicine

## 2014-03-08 DIAGNOSIS — B351 Tinea unguium: Secondary | ICD-10-CM

## 2014-03-09 LAB — LIPID PANEL
CHOL/HDL RATIO: 5.5 ratio
Cholesterol: 197 mg/dL (ref 0–200)
HDL: 36 mg/dL — AB (ref >39)
LDL Cholesterol: 110 mg/dL — ABNORMAL HIGH (ref 0–99)
Triglycerides: 253 mg/dL — ABNORMAL HIGH (ref <150)
VLDL: 51 mg/dL — AB (ref 0–40)

## 2014-03-25 ENCOUNTER — Telehealth: Payer: Self-pay | Admitting: Internal Medicine

## 2014-03-25 NOTE — Telephone Encounter (Addendum)
Pt. Has not received results from lab work on 7/23.Marland Kitchen.Marland Kitchen.please call patient with Spanish interpreter.Marland Kitchen.Marland Kitchen..Marland Kitchen

## 2014-04-01 ENCOUNTER — Encounter: Payer: Self-pay | Admitting: Internal Medicine

## 2014-04-01 ENCOUNTER — Other Ambulatory Visit (HOSPITAL_COMMUNITY)
Admission: RE | Admit: 2014-04-01 | Discharge: 2014-04-01 | Disposition: A | Discharge status: Home / Self Care | Payer: No Typology Code available for payment source | Source: Ambulatory Visit | Attending: Internal Medicine | Admitting: Internal Medicine

## 2014-04-01 ENCOUNTER — Ambulatory Visit: Payer: No Typology Code available for payment source | Attending: Internal Medicine | Admitting: Internal Medicine

## 2014-04-01 VITALS — BP 90/56 | HR 99 | Temp 99.1°F | Resp 16 | Ht 64.0 in | Wt 165.8 lb

## 2014-04-01 DIAGNOSIS — IMO0002 Reserved for concepts with insufficient information to code with codable children: Secondary | ICD-10-CM | POA: Insufficient documentation

## 2014-04-01 DIAGNOSIS — R309 Painful micturition, unspecified: Secondary | ICD-10-CM

## 2014-04-01 DIAGNOSIS — R300 Dysuria: Secondary | ICD-10-CM | POA: Insufficient documentation

## 2014-04-01 DIAGNOSIS — Z01419 Encounter for gynecological examination (general) (routine) without abnormal findings: Secondary | ICD-10-CM | POA: Insufficient documentation

## 2014-04-01 DIAGNOSIS — Z113 Encounter for screening for infections with a predominantly sexual mode of transmission: Secondary | ICD-10-CM | POA: Insufficient documentation

## 2014-04-01 DIAGNOSIS — N760 Acute vaginitis: Secondary | ICD-10-CM | POA: Insufficient documentation

## 2014-04-01 DIAGNOSIS — Z1151 Encounter for screening for human papillomavirus (HPV): Secondary | ICD-10-CM | POA: Insufficient documentation

## 2014-04-01 LAB — POCT URINALYSIS DIPSTICK
Bilirubin, UA: NEGATIVE
Blood, UA: NEGATIVE
Glucose, UA: NEGATIVE
KETONES UA: NEGATIVE
Nitrite, UA: NEGATIVE
PH UA: 5.5
PROTEIN UA: NEGATIVE
Spec Grav, UA: 1.025
UROBILINOGEN UA: 0.2

## 2014-04-01 NOTE — Patient Instructions (Signed)
Vaginosis bacteriana (Bacterial Vaginosis) La vaginosis bacteriana es una infeccin vaginal que perturba el equilibrio normal de las bacterias que se encuentran en la vagina. Es el resultado de un crecimiento excesivo de ciertas bacterias. Esta es la infeccin vaginal ms frecuente en mujeres en edad reproductiva. El tratamiento es importante para prevenir complicaciones, especialmente en mujeres embarazadas, dado que puede causar un parto prematuro. CAUSAS  La vaginosis bacteriana se origina por un aumento de bacterias nocivas que, generalmente, estn presentes en cantidades ms pequeas en la vagina. Varios tipos diferentes de bacterias pueden causar esta afeccin. Sin embargo, la causa de su desarrollo no se comprende totalmente. FACTORES DE RIESGO Ciertas actividades o comportamientos pueden exponerlo a un mayor riesgo de desarrollar vaginosis bacteriana, entre los que se incluyen:  Tener una nueva pareja sexual o mltiples parejas sexuales.  Las duchas vaginales  El uso del DIU (dispositivo intrauterino) como mtodo anticonceptivo. El contagio no se produce en baos, por ropas de cama, en piscinas o por contacto con objetos. SIGNOS Y SNTOMAS  Algunas mujeres que padecen vaginosis bacteriana no presentan signos ni sntomas. Los sntomas ms comunes son:  Secrecin vaginal de color grisceo.  Secrecin vaginal con olor similar al pescado, especialmente despus de mantener relaciones sexuales.  Picazn o sensacin de ardor en la vagina o la vulva.  Ardor o dolor al orinar. DIAGNSTICO  Su mdico analizar su historia clnica y le examinar la vagina para detectar signos de vaginosis bacteriana. Puede tomarle una muestra de flujo vaginal. Su mdico examinar esta muestra con un microscopio para controlar las bacterias y clulas anormales. Tambin puede realizarse un anlisis del pH vaginal.  TRATAMIENTO  La vaginosis bacteriana puede tratarse con antibiticos, en forma de comprimidos o  de crema vaginal. Puede indicarse una segunda tanda de antibiticos si la afeccin se repite despus del tratamiento.  INSTRUCCIONES PARA EL CUIDADO EN EL HOGAR   Tome solo medicamentos de venta libre o recetados, segn las indicaciones del mdico.  Si le han recetado antibiticos, tmelos como se le indic. Asegrese de que finaliza la prescripcin completa aunque se sienta mejor.  No mantenga relaciones sexuales hasta completar el tratamiento.  Comunique a sus compaeros sexuales que sufre una infeccin vaginal. Deben consultar a su mdico y recibir tratamiento si tienen problemas, como picazn o una erupcin cutnea leve.  Practique el sexo seguro usando preservativos y tenga un nico compaero sexual. SOLICITE ATENCIN MDICA SI:   Sus sntomas no mejoran despus de 3 das de tratamiento.  Aumenta la secrecin o el dolor.  Tiene fiebre. ASEGRESE DE QUE:   Comprende estas instrucciones.  Controlar su afeccin.  Recibir ayuda de inmediato si no mejora o si empeora. PARA OBTENER MS INFORMACIN  Centros para el control y la prevencin de enfermedades (Centers for Disease Control and Prevention, CDC): www.cdc.gov/std Asociacin Est[place]oun[place]ense de la Salud Sexual (American Sexual Health Association, SHA): www.ashastd.org  Document Released: 11/09/2007 Document Revised: 05/23/2013 ExitCare Patient Information 2015 [employer]. This information is not intended to replace advice given to you by your health care provider. Make sure you discuss any questions you have with your health care provider.  

## 2014-04-01 NOTE — Progress Notes (Signed)
Pt stated unable to hold urine, pain with intercourse and bleeding after. Vaginal fluid with odor

## 2014-04-01 NOTE — Progress Notes (Signed)
Patient ID: Courtney Edwards, female   DOB: 09/26/1975, 38 y.o.   MRN: [id number]  CC: vaginal odor  HPI:  Patient reports today with c/o of painful intercourse.  She states that the pentration causes her stomach to hurt, as if he is hitting a bone.  She reports that she has a pulling sensation since November after the birth of her last baby.  She also reports that it is very uncomfortable to lay down.  She states that she has had a vaginal discharge for one month with a strong odor.    No Known Allergies Past Medical History  Diagnosis Date  . Medical history non-contributory   . Mental disorder   . Post partum depression 2002   Current Outpatient Prescriptions on File Prior to Visit  Medication Sig Dispense Refill  . hydrocortisone (ANUSOL-HC) 25 MG suppository Place 1 suppository (25 mg total) rectally 2 (two) times daily.  24 suppository  1  . polyethylene glycol powder (GLYCOLAX/MIRALAX) powder Take 17 g by mouth daily.  850 g  1  . Prenatal Vit-Fe Fumarate-FA (PRENATAL MULTIVITAMIN) TABS tablet Take 1 tablet by mouth daily at 12 noon.      . terbinafine (LAMISIL AT) 1 % cream Apply 1 application topically 2 (two) times daily.  30 g  2  . famotidine (PEPCID) 20 MG tablet Take 1 tablet (20 mg total) by mouth daily.  30 tablet  3  . ibuprofen (ADVIL,MOTRIN) 600 MG tablet Take 1 tablet (600 mg total) by mouth every 6 (six) hours.  30 tablet  0  . magnesium hydroxide (MILK OF MAGNESIA) 400 MG/5ML suspension Take 5 mLs by mouth daily as needed for mild constipation.  360 mL  1   No current facility-administered medications on file prior to visit.   Family History  Problem Relation Age of Onset  . Hypertension Mother   . Diabetes Mother   . Heart disease Mother    History   Social History  . Marital Status: Single    Spouse Name: N/A    Number of Children: N/A  . Years of Education: N/A   Occupational History  . Not on file.   Social History Main Topics  .  Smoking status: Never Smoker   . Smokeless tobacco: Not on file  . Alcohol Use: No  . Drug Use: No  . Sexual Activity: No   Other Topics Concern  . Not on file   Social History Narrative  . No narrative on file    Review of Systems: Constitutional: Negative for fever, chills, diaphoresis, activity change, appetite change and fatigue. HENT: Negative for ear pain, nosebleeds, congestion, facial swelling, rhinorrhea, neck pain, neck stiffness and ear discharge.  Eyes: Negative for pain, discharge, redness, itching and visual disturbance. Respiratory: Negative for cough, choking, chest tightness, shortness of breath, wheezing and stridor.  Cardiovascular: Negative for chest pain, palpitations and leg swelling. Gastrointestinal: Negative for abdominal distention. Genitourinary: Negative for dysuria, urgency, frequency, hematuria, flank pain, decreased urine volume, difficulty urinating and dyspareunia.  Musculoskeletal: Negative for back pain, joint swelling, arthralgias and gait problem. Neurological: Negative for dizziness, tremors, seizures, syncope, facial asymmetry, speech difficulty, weakness, light-headedness, numbness and headaches.  Hematological: Negative for adenopathy. Does not bruise/bleed easily. Psychiatric/Behavioral: Negative for hallucinations, behavioral problems, confusion, dysphoric mood, decreased concentration and agitation.    Objective:   Filed Vitals:   04/01/14 0926  BP: 90/56  Pulse: 99  Temp: 99.1 F (37.3 C)  Resp: 16   Physical  Exam  Cardiovascular: Normal rate, regular rhythm and normal heart sounds.   Pulmonary/Chest: Effort normal.  Abdominal: Soft. Bowel sounds are normal. She exhibits no distension.  Genitourinary: Uterus normal. Cervix exhibits no motion tenderness, no discharge and no friability.     Lab Results  Component Value Date   WBC 7.8 08/06/2013   HGB 13.4 08/06/2013   HCT 38.8 08/06/2013   MCV 89.0 08/06/2013   PLT 414*  08/06/2013   Lab Results  Component Value Date   CREATININE 0.59 08/06/2013   BUN 11 08/06/2013   NA 141 08/06/2013   K 4.3 08/06/2013   CL 106 08/06/2013   CO2 25 08/06/2013    No results found for this basename: HGBA1C   Lipid Panel     Component Value Date/Time   CHOL 197 03/08/2014 0925   TRIG 253* 03/08/2014 0925   HDL 36* 03/08/2014 0925   CHOLHDL 5.5 03/08/2014 0925   VLDL 51* 03/08/2014 0925   LDLCALC 110* 03/08/2014 0925       Assessment and plan:   [place] was seen today for vaginal pain.  Diagnoses and associated orders for this visit:  Painful urination - Urinalysis Dipstick - Cytology - PAP Anton Ruiz - Cervicovaginal ancillary only  Dyspareunia  - Ambulatory referral to Gynecology---to r/o prolapse   Return if symptoms worsen or fail to improve.       Holland Commons, NP-C [facility] [phone] 04/01/2014, 9:28 AM

## 2014-04-03 LAB — CYTOLOGY - PAP

## 2014-04-04 ENCOUNTER — Telehealth: Payer: Self-pay | Admitting: Emergency Medicine

## 2014-04-04 ENCOUNTER — Telehealth: Payer: Self-pay | Admitting: *Deleted

## 2014-04-04 NOTE — Telephone Encounter (Signed)
Message copied by Darlis LoanSMITH, JILL D on Thu Apr 04, 2014  2:46 PM ------      Message from: Holland CommonsKECK, VALERIE A      Created: Thu Apr 04, 2014 12:00 AM       Pap positive for yeast, negative for malignancies. Please send diflucan 150 mg PO once with one refill. She may repeat in one week if no resolution of symptoms ------

## 2014-04-04 NOTE — Telephone Encounter (Signed)
Message left via [employer] Interpreter on home VM and with husband  for patient to return call to discuss lab results.

## 2014-04-04 NOTE — Telephone Encounter (Signed)
Message copied by Fredderick SeveranceUCATTE, Ahley Bulls L on Thu Apr 04, 2014 12:43 PM ------      Message from: Holland CommonsKECK, VALERIE A      Created: Tue Mar 12, 2014  7:32 PM       Please let patient know that her triglycerides are high. Please educate patient on diet and exercise. Thanks ------

## 2014-04-05 ENCOUNTER — Telehealth: Payer: Self-pay | Admitting: Internal Medicine

## 2014-04-05 ENCOUNTER — Telehealth: Payer: Self-pay | Admitting: Emergency Medicine

## 2014-04-05 MED ORDER — FLUCONAZOLE 150 MG PO TABS: 150.0000 mg | ORAL_TABLET | Freq: Once | ORAL | Status: DC

## 2014-04-05 NOTE — Telephone Encounter (Signed)
Pt. Calling to get results of Pap smear. Please f/u with pt.  °

## 2014-04-05 NOTE — Telephone Encounter (Signed)
Pt given pap smear results. Instructed to take Diflucan 150 mg tab once and repeat in 1 week no improvement. [facility] per e-scribe [employer] interpretor line used for communication

## 2014-04-12 ENCOUNTER — Encounter: Payer: Self-pay | Admitting: Obstetrics & Gynecology

## 2014-05-20 ENCOUNTER — Encounter: Payer: Self-pay | Admitting: Obstetrics & Gynecology

## 2014-05-20 ENCOUNTER — Ambulatory Visit (INDEPENDENT_AMBULATORY_CARE_PROVIDER_SITE_OTHER): Payer: Self-pay | Admitting: Obstetrics & Gynecology

## 2014-05-20 VITALS — BP 112/56 | HR 76 | Temp 98.7°F | Ht 63.5 in | Wt 161.6 lb

## 2014-05-20 DIAGNOSIS — N941 Dyspareunia: Secondary | ICD-10-CM

## 2014-05-20 DIAGNOSIS — IMO0002 Reserved for concepts with insufficient information to code with codable children: Secondary | ICD-10-CM | POA: Insufficient documentation

## 2014-05-20 DIAGNOSIS — R102 Pelvic and perineal pain: Secondary | ICD-10-CM

## 2014-05-20 NOTE — Progress Notes (Signed)
Patient ID: Courtney Edwards, female   DOB: 10/23/1975, 38 y.o.   MRN: [id number]  Chief Complaint  Patient presents with  . Gynecologic Exam    HPI Courtney PartridgeMiriam Edwards Courtney BirchConstancia Samuel Edwards is a 38 y.o. female.  [id number] Patient's last menstrual period was 05/08/2014. Deep dyspareunia since vaginal delivery 03/2013, with postcoital bleeding, o/w nl menses no dysmenorrhea  HPI  Past Medical History  Diagnosis Date  . Medical history non-contributory   . Mental disorder   . Post partum depression 2002    Past Surgical History  Procedure Laterality Date  . No past surgeries      Family History  Problem Relation Age of Onset  . Hypertension Mother   . Diabetes Mother   . Heart disease Mother     Social History History  Substance Use Topics  . Smoking status: Never Smoker   . Smokeless tobacco: Not on file  . Alcohol Use: No    No Known Allergies  Current Outpatient Prescriptions  Medication Sig Dispense Refill  . famotidine (PEPCID) 20 MG tablet Take 1 tablet (20 mg total) by mouth daily.  30 tablet  3  . polyethylene glycol powder (GLYCOLAX/MIRALAX) powder Take 17 g by mouth daily.  850 g  1  . Prenatal Vit-Fe Fumarate-FA (PRENATAL MULTIVITAMIN) TABS tablet Take 1 tablet by mouth daily at 12 noon.      . terbinafine (LAMISIL AT) 1 % cream Apply 1 application topically 2 (two) times daily.  30 g  2  . fluconazole (DIFLUCAN) 150 MG tablet Take 1 tablet (150 mg total) by mouth once.  1 tablet  1  . hydrocortisone (ANUSOL-HC) 25 MG suppository Place 1 suppository (25 mg total) rectally 2 (two) times daily.  24 suppository  1  . ibuprofen (ADVIL,MOTRIN) 600 MG tablet Take 1 tablet (600 mg total) by mouth every 6 (six) hours.  30 tablet  0  . magnesium hydroxide (MILK OF MAGNESIA) 400 MG/5ML suspension Take 5 mLs by mouth daily as needed for mild constipation.  360 mL  1   No current facility-administered medications for this visit.    Review of Systems Review of  Systems  Constitutional: Negative.   Genitourinary: Positive for pelvic pain and dyspareunia. Negative for dysuria, hematuria, vaginal bleeding, vaginal discharge and enuresis.    Blood pressure 112/56, pulse 76, temperature 98.7 F (37.1 C), height 5' 3.5" (1.613 m), weight 73.301 kg (161 lb 9.6 oz), last menstrual period 05/08/2014, currently breastfeeding.  Physical Exam Physical Exam  Vitals reviewed. Constitutional: She is oriented to person, place, and time. She appears well-developed. No distress.  Pulmonary/Chest: Effort normal. No respiratory distress.  Abdominal: Soft. She exhibits no distension and no mass. There is no tenderness.  Genitourinary: Vagina normal. No vaginal discharge found.  Minimal discomfort at introitus, mild cmt but posterior uterine corpus is tender,retroverted vs posterior mass  Neurological: She is oriented to person, place, and time.  Skin: Skin is warm and dry.  Psychiatric: She has a normal mood and affect. Her behavior is normal.    Data Reviewed delivery  Assessment    Dyspareunia and pelvic tenderness     Plan    Pelvic US.RTC 2-3 weeks        Courtney Edwards 05/20/2014, 3:10 PM

## 2014-05-27 ENCOUNTER — Ambulatory Visit (HOSPITAL_COMMUNITY)
Admission: RE | Admit: 2014-05-27 | Discharge: 2014-05-27 | Disposition: A | Discharge status: Home / Self Care | Payer: Medicaid Other | Source: Ambulatory Visit | Attending: Obstetrics & Gynecology | Admitting: Obstetrics & Gynecology

## 2014-05-27 DIAGNOSIS — N854 Malposition of uterus: Secondary | ICD-10-CM | POA: Insufficient documentation

## 2014-05-27 DIAGNOSIS — R102 Pelvic and perineal pain: Secondary | ICD-10-CM

## 2014-05-27 DIAGNOSIS — N949 Unspecified condition associated with female genital organs and menstrual cycle: Secondary | ICD-10-CM | POA: Insufficient documentation

## 2014-06-17 ENCOUNTER — Encounter: Payer: Self-pay | Admitting: Obstetrics & Gynecology

## 2014-09-23 ENCOUNTER — Ambulatory Visit: Payer: Self-pay | Attending: Internal Medicine

## 2015-01-27 ENCOUNTER — Encounter: Payer: Self-pay | Admitting: Internal Medicine

## 2015-01-27 ENCOUNTER — Ambulatory Visit: Payer: Self-pay | Attending: Internal Medicine | Admitting: Internal Medicine

## 2015-01-27 ENCOUNTER — Other Ambulatory Visit: Payer: Self-pay

## 2015-01-27 VITALS — BP 96/62 | HR 70 | Temp 98.0°F | Resp 16 | Ht 63.5 in | Wt 169.0 lb

## 2015-01-27 DIAGNOSIS — R002 Palpitations: Secondary | ICD-10-CM | POA: Insufficient documentation

## 2015-01-27 DIAGNOSIS — R42 Dizziness and giddiness: Secondary | ICD-10-CM | POA: Insufficient documentation

## 2015-01-27 DIAGNOSIS — B351 Tinea unguium: Secondary | ICD-10-CM | POA: Insufficient documentation

## 2015-01-27 DIAGNOSIS — R5383 Other fatigue: Secondary | ICD-10-CM | POA: Insufficient documentation

## 2015-01-27 LAB — CBC WITH DIFFERENTIAL/PLATELET
Basophils Absolute: 0 10*3/uL (ref 0.0–0.1)
Basophils Relative: 0 % (ref 0–1)
EOS PCT: 1 % (ref 0–5)
Eosinophils Absolute: 0.1 10*3/uL (ref 0.0–0.7)
HCT: 39.7 % (ref 36.0–46.0)
HEMOGLOBIN: 13.2 g/dL (ref 12.0–15.0)
LYMPHS PCT: 33 % (ref 12–46)
Lymphs Abs: 2.7 10*3/uL (ref 0.7–4.0)
MCH: 29.4 pg (ref 26.0–34.0)
MCHC: 33.2 g/dL (ref 30.0–36.0)
MCV: 88.4 fL (ref 78.0–100.0)
MONOS PCT: 10 % (ref 3–12)
MPV: 10.2 fL (ref 8.6–12.4)
Monocytes Absolute: 0.8 10*3/uL (ref 0.1–1.0)
Neutro Abs: 4.5 10*3/uL (ref 1.7–7.7)
Neutrophils Relative %: 56 % (ref 43–77)
Platelets: 372 10*3/uL (ref 150–400)
RBC: 4.49 MIL/uL (ref 3.87–5.11)
RDW: 15 % (ref 11.5–15.5)
WBC: 8.1 10*3/uL (ref 4.0–10.5)

## 2015-01-27 LAB — LIPID PANEL
CHOLESTEROL: 194 mg/dL (ref 0–200)
HDL: 38 mg/dL — ABNORMAL LOW (ref >=46)
LDL Cholesterol: 122 mg/dL — ABNORMAL HIGH (ref 0–99)
Total CHOL/HDL Ratio: 5.1 Ratio
Triglycerides: 169 mg/dL — ABNORMAL HIGH (ref <150)
VLDL: 34 mg/dL (ref 0–40)

## 2015-01-27 LAB — POCT GLYCOSYLATED HEMOGLOBIN (HGB A1C): Hemoglobin A1C: 5.5

## 2015-01-27 LAB — BASIC METABOLIC PANEL
BUN: 8 mg/dL (ref 6–23)
CO2: 22 mEq/L (ref 19–32)
Calcium: 9.5 mg/dL (ref 8.4–10.5)
Chloride: 107 mEq/L (ref 96–112)
Creat: 0.57 mg/dL (ref 0.50–1.10)
GLUCOSE: 90 mg/dL (ref 70–99)
Potassium: 4.8 mEq/L (ref 3.5–5.3)
SODIUM: 140 meq/L (ref 135–145)

## 2015-01-27 LAB — GLUCOSE, POCT (MANUAL RESULT ENTRY): POC GLUCOSE: 97 mg/dL (ref 70–99)

## 2015-01-27 LAB — TSH: TSH: 3.381 u[IU]/mL (ref 0.350–4.500)

## 2015-01-27 MED ORDER — TERBINAFINE HCL 250 MG PO TABS: 250.0000 mg | ORAL_TABLET | Freq: Every day | ORAL | Status: DC

## 2015-01-27 NOTE — Progress Notes (Signed)
Patient ID: Courtney Edwards, female   DOB: 02/04/1976, 39 y.o.   MRN: [id number]  CC: dizziness, fatigue, heart palpitations   HPI: Courtney Edwards is a 39 y.o. female here today for a follow up visit.  Patient has no past medical history. Patient reports that in January she suffered blurred vision and nausea.  She states that she was in the store and began to fell weak with vision blurred so she sat down and drank a coke which made her feel better.  Since that time she has continued to suffer from dizziness and fatigue.  She feels like her head is heavy and she has heart palpitations. Her arms feel weak and numb at times. Her dizziness is reported as a spinning sensation. She reports normal menstrual cycles with the last one occuring on May 16th. She is concerned because lately she feels like when her husband corrects her on something she feels like she is making it a bigger situation by her reactions. She has 5 kids but denies increased stress or anxiety level. She has no history of anemia or Diabetes.   No Known Allergies Past Medical History  Diagnosis Date  . Medical history non-contributory   . Mental disorder   . Post partum depression 2002   Current Outpatient Prescriptions on File Prior to Visit  Medication Sig Dispense Refill  . hydrocortisone (ANUSOL-HC) 25 MG suppository Place 1 suppository (25 mg total) rectally 2 (two) times daily. (Patient not taking: Reported on 01/27/2015) 24 suppository 1  . terbinafine (LAMISIL AT) 1 % cream Apply 1 application topically 2 (two) times daily. (Patient not taking: Reported on 01/27/2015) 30 g 2   No current facility-administered medications on file prior to visit.   Family History  Problem Relation Age of Onset  . Hypertension Mother   . Diabetes Mother   . Heart disease Mother    History   Social History  . Marital Status: Married    Spouse Name: N/A  . Number of Children: N/A  . Years of Education: N/A   Occupational  History  . Not on file.   Social History Main Topics  . Smoking status: Never Smoker   . Smokeless tobacco: Not on file  . Alcohol Use: No  . Drug Use: No  . Sexual Activity: No   Other Topics Concern  . Not on file   Social History Narrative    Review of Systems  Respiratory: Positive for shortness of breath.   Cardiovascular: Positive for chest pain and palpitations.  Neurological: Positive for dizziness and tingling.  Psychiatric/Behavioral: Negative for depression, suicidal ideas and substance abuse. The patient is not nervous/anxious.   All other systems reviewed and are negative.     Objective:   Filed Vitals:   01/27/15 0959  BP: 96/62  Pulse: 70  Temp: 98 F (36.7 C)  Resp: 16    Physical Exam  Constitutional: She is oriented to person, place, and time. No distress.  Cardiovascular: Normal rate, regular rhythm and normal heart sounds.   Pulmonary/Chest: Effort normal and breath sounds normal.  Neurological: She is alert and oriented to person, place, and time. No cranial nerve deficit.  Skin: Skin is warm and dry. She is not diaphoretic.  Psychiatric: She has a normal mood and affect.     Lab Results  Component Value Date   WBC 7.8 08/06/2013   HGB 13.4 08/06/2013   HCT 38.8 08/06/2013   MCV 89.0 08/06/2013   PLT 414* 08/06/2013  Lab Results  Component Value Date   CREATININE 0.59 08/06/2013   BUN 11 08/06/2013   NA 141 08/06/2013   K 4.3 08/06/2013   CL 106 08/06/2013   CO2 25 08/06/2013    No results found for: HGBA1C Lipid Panel     Component Value Date/Time   CHOL 197 03/08/2014 0925   TRIG 253* 03/08/2014 0925   HDL 36* 03/08/2014 0925   CHOLHDL 5.5 03/08/2014 0925   VLDL 51* 03/08/2014 0925   LDLCALC 110* 03/08/2014 0925       Assessment and plan:   Nahima was seen today for dizziness and fatigue.  Diagnoses and all orders for this visit:  Dizziness Orders: -     POCT glucose (manual entry) -     POCT glycosylated  hemoglobin (Hb A1C) -     CBC with Differential Orthostatic vitals-negative  Patient not diabetic. I will check a cbc, bmet, TSH level   Other fatigue Orders: -     Basic Metabolic Panel -     TSH See above   Palpitations Orders: -     Lipid panel -     EKG 12-Lead EKG: normal EKG, normal sinus rhythm. May be due to anxiety. I will look at lab test first  Onychomycosis Orders: -     Begin terbinafine (LAMISIL) 250 MG tablet; Take 1 tablet (250 mg total) by mouth daily. Only on two fingers, she has tried 4 months of OTC lamisil cream without relief. I will try patient on a one month trial of PO Lamisil to see if she has any improvement.  Return if symptoms worsen or fail to improve.       Holland Commons, NP-C [facility] [phone] 01/27/2015, 10:32 AM

## 2015-01-27 NOTE — Patient Instructions (Addendum)
Fatiga (Fatigue) Fatiga es la sensacin de cansancio, falta de energa, falta de motivacin, o sensacin permanente de Health and safety inspector. Si descansa lo suficiente, se alimenta bien y reduce las situaciones de estrs, Warehouse manager. Consulte con su mdico si esto persiste. La naturaleza de su fatiga indicar a su mdico cul es la causa. El tratamiento se implementa segn cul sea la causa.  CAUSAS Hay muchas causas de fatiga. La mayora de las veces puede hallarse en uno o ms de sus hbitos o rutinas. [place] causas de fatiga pueden incluirse en una o ms de tres reas generales: Ellas son: Problemas en el estilo de vida  Trastornos del sueo.  Trabajar demasiado.  Agotamiento fsico.  Hbitos no saludables.  Malos hbitos alimenticios o trastornos de alimentacin.  Uso de alcohol y/o droga.  Falta de nutricin adecuada (desnutricin). Problemas psiclogos  Problemas de estrs y/o ansiedad.  Depresin.  Duelo.  Aburrimiento. Trastornos o problemas mdicos  Anemia.  Embarazo.  Problemas en la glndula tiroides.  Recuperacin de [employer].  Dolores continuos.  Enfisema o asma no controladas adecuadamente.  Problemas alrgicos.  Diabetes.  Infecciones (como la mononucleosis).  Obesidad.  Trastornos del sueo, como apnea del sueo.  Insuficiencia cardiaca u otros problemas relacionados con el corazn.  Cncer.  Enfermedad del rin.  Enfermedad heptica.  Efectos de ciertos [employer] antihistamnicos, medicamentos para la tos y el resfro, analgsicos prescriptos, medicamentos para la hipertensin arterial y Film/video editor, medicamentos utilizados para el tratamiento de cncer y algunos antidepresivos. SNTOMAS Los sntomas de fatiga son:   Hollace Kinnier de Teacher, early years/pre.  Falta de motivacin.  Somnolencia.  Sensacin de indiferencia Psychologist, occupational. DIAGNSTICO Los detalles de cmo usted siente guan a su mdico para Sales promotion account executive. Le preguntar sobre su estado de salud presente y pasado. Esto es importante para revisar todos los medicamentos que usted toma, incluyendo los recetados y los no recetados. Le realizar un examen fsico exhaustivo. Le preguntar acerca de sus sentimientos, hbitos y estilo de vida normal. Su mdico puede indicar anlisis de [place], de [place] u otras pruebas para buscar las causas ms comunes de la fatiga.  TRATAMIENTO La fatiga se trata corrigiendo la causa subyacente. Por ejemplo, si usted tiene dolor continuo o depresin, el tratamiento de estas causas mejorar el problema. De mismo modo, al ajustar la dosis de ciertos medicamentos ayudar a Sports coach.  INSTRUCCIONES PARA EL CUIDADO DOMICILIARIO  Trate de dormir lo suficiente todas las noches.  Mantenga una dieta sana y [place], y beba suficiente agua Custar.  Practique modos de relajarse (como el yoga o la meditacin).  Haga ejercicios regularmente.  Haga planes para cambiar las situaciones que causan estrs. Haga esos planes de [employer] las tensiones disminuyan a lo largo del [place]. Mantenga su rutina personal y laboral dentro de lmites razonables.  Evite las drogas de la calle y minimice el consumo de alcohol.  Comience a tomar un multivitamnico diario tras consultar a su mdico. SOLICITE ATENCIN MDICA SI:  Sufre un cansancio persistente, que no puede describir.  Tiene fiebre.  Pierde peso de [place] involuntaria.  Sufre dolores de [place].  Sufre trastornos de [employer] noche.  Se siente triste.  Sufre constipacin.  Tiene la piel seca.  USAA.  Toma algn medicamento nuevo o diferente y sospecha que le causa fatiga.  No puede dormir por la noche.  Observa hinchazn inusual en sus piernas u otras partes del cuerpo. SOLICITE  ATENCIN MDICA SI:  Se siente confundido.  Su visin es borrosa.  Sufre mareos o se desmaya.  Sufre un dolor de cabeza intenso.  Sufre  un dolor abdominal, plvico o de espalda intensos.  Siente dolor en el pecho, le falta el aire o tiene un ritmo cardaco irregular o rpido.  No puede orinar normalmente.  Tiene una hemorragia anormal, como sangrado del recto o vomita sangre.  Tiene ideas de suicidio o de Corporate investment banker.  Est preocupado porque podra perjudicar a alguien ms. ASEGRESE QUE:   Comprende estas instrucciones.  Controlar su enfermedad.  Solicitar ayuda de inmediato si no mejora o si empeora. Document Released: 11/18/2008 Document Revised: 10/25/2011 [facility] Patient Information 2015 [place], [place]. This information is not intended to replace advice given to you by your health care provider. Make sure you discuss any questions you have with your health care provider.  Trastorno de ansiedad generalizada (Generalized Anxiety Disorder) El trastorno de ansiedad generalizada es un trastorno mental. Interfiere en las funciones vitales, incluyendo las [place], el trabajo y la escuela.  Es diferente de la ansiedad normal que todas las personas experimentan en algn momento de su vida en respuesta a sucesos y Chief Operating Officer. En verdad, la ansiedad normal nos ayuda a prepararnos y Human resources officer acontecimientos y actividades de la vida. La ansiedad normal desaparece despus de que el evento o la actividad ha finalizado.  El trastorno de ansiedad generalizada no est necesariamente relacionada con eventos o actividades especficas. Tambin causa un exceso de ansiedad en proporcin a sucesos o actividades especficas. En este trastorno la ansiedad es difcil de Chief Operating Officer. Los sntomas pueden variar de leves a muy graves. Las personas que sufren de trastorno de ansiedad generalizada pueden tener intensas olas de ansiedad con sntomas fsicos (ataques de pnico).  SNTOMAS  La ansiedad y la preocupacin asociada a este trastorno son difciles de Chief Operating Officer. Esta ansiedad y la preocupacin estn relacionados con  muchos eventos de la vida y sus actividades y tambin ocurre durante ms [employer] que no ocurre, durante 6 meses o ms. Las personas que la sufren pueden tener tres o ms de los siguientes sntomas (uno o ms en los nios):   Glass blower/designer.  Dificultades de concentracin.   Irritabilidad.  Tensin muscular  Dificultad para dormirse o sueo poco satisfactorio. DIAGNSTICO  Se diagnostica a travs de una evaluacin realizada por el mdico. El mdico le har preguntas acerca de su estado de nimo, sntomas fsicos y sucesos de [place] vida. Le har preguntas sobre su historia clnica, el consumo de alcohol o drogas, incluyendo los medicamentos recetados. [employer] un examen fsico e indicar anlisis de [place]. Ciertas enfermedades y el uso de determinadas sustancias pueden causar sntomas similares a este trastorno. Su mdico lo puede derivar a Music therapist en salud mental para una evaluacin ms profunda.Gerlean Ren  Las terapias siguientes se utilizan en el tratamiento de este trastorno:   Medicamentos - Se recetan antidepresivos para el control diario a Air cabin crew. Pueden indicarse tambin medicamentos para combatir la [employer] graves, especialmente cuando ocurren ataques de pnico.   Terapia conversada (psicoterapia) Ciertos tipos de psicoterapia pueden ser tiles en el tratamiento del trastorno de ansiedad generalizada, proporcionando apoyo, educacin y Optometrist. Una forma de psicoterapia llamada terapia cognitivo-conductual puede ensearle formas saludables de pensar y Publishing rights manager a los eventos y actividades de la vida diaria.  Tcnicasde manejo del estrs- Estas tcnicas incluyen el yoga, la meditacin y el ejercicio y  pueden ser muy tiles cuando se practican con regularidad. Un especialista en salud mental puede ayudar a determinar qu tratamiento es mejor para usted. Algunas personas obtienen mejora con una terapia. Sin embargo, Economist  requieren una combinacin de terapias.  Document Released: 11/27/2012 Document Revised: 12/17/2013 [facility] Patient Information 2015 [place], [place]. This information is not intended to replace advice given to you by your health care provider. Make sure you discuss any questions you have with your health care provider.

## 2015-01-27 NOTE — Progress Notes (Signed)
Stated lost vision, nausea on January. Since Saturday feeling dizzy, tired,  Head  Feel heavy and  Heart palpitation.

## 2015-02-05 ENCOUNTER — Telehealth: Payer: Self-pay | Admitting: Internal Medicine

## 2015-02-05 NOTE — Telephone Encounter (Signed)
Patient called requesting lab results, please f/u

## 2015-02-06 ENCOUNTER — Telehealth: Payer: Self-pay

## 2015-02-06 ENCOUNTER — Other Ambulatory Visit: Payer: Self-pay | Admitting: Internal Medicine

## 2015-02-06 ENCOUNTER — Other Ambulatory Visit: Payer: Self-pay

## 2015-02-06 DIAGNOSIS — E785 Hyperlipidemia, unspecified: Secondary | ICD-10-CM

## 2015-02-06 MED ORDER — ATORVASTATIN CALCIUM 10 MG PO TABS: 10.0000 mg | ORAL_TABLET | Freq: Every day | ORAL | Status: DC

## 2015-02-06 NOTE — Telephone Encounter (Signed)
Nurse called patient, via in house interpreter, Courtney Edwards. Patient verified date of birth. Patient aware of normal labs except cholesterol.  Patient agrees to go to pharmacy to pick up atorvastatin and will take medication every evening to help lower numbers. Patient advised to eat low fat, low cholesterol diet and exercise. Patient questioned cholesterol results.  Nurse advised patient triglycerides are 169 with a normal range of <150 and LDL is 122 with a normal range of 0-99. Patient voices understanding and has no further questions at this time.

## 2015-02-06 NOTE — Telephone Encounter (Signed)
-----   Message from Ambrose Finland, NP sent at 01/28/2015  7:19 PM EDT ----- Labs are normal except for cholesterol. Please send atorvastatin 10 mg to take every evening to help lower numbers. Please go over appropriate diet and exercise.

## 2015-02-06 NOTE — Telephone Encounter (Signed)
Pt called stating she called pharmacy and medication atorvastatin 10 mg is not ready. Please f/u with patient

## 2015-02-10 ENCOUNTER — Other Ambulatory Visit: Payer: Self-pay | Admitting: Internal Medicine

## 2015-02-10 ENCOUNTER — Telehealth: Payer: Self-pay

## 2015-02-10 DIAGNOSIS — K649 Unspecified hemorrhoids: Secondary | ICD-10-CM

## 2015-02-10 MED ORDER — HYDROCORTISONE ACETATE 25 MG RE SUPP: 25.0000 mg | Freq: Two times a day (BID) | RECTAL | Status: DC

## 2015-02-10 NOTE — Telephone Encounter (Signed)
Patient here in clinic questioning lab results. In house interpreter, [place], translated. Patient questions results. Nurse explained cholesterol is high and atorvastatin at the pharmacy would help. Patient has medication with her, nurse explained to patient this is the correct medication to lower the cholesterol. Patient thought nurse had told her in previous phone call cholesterol was high and there was something else abnormal. Nurse explained triglycerides, HDL and LDL ar all components of cholesterol. Patient explained she had been anemic in the past. Nurse informed patient Hgb was good, not anemic at this time. Patient reports provider had said suppositories for hemorrhoids would be prescribed. Patient needing suppositories for hemorrhoids sent to pharmacy.

## 2015-02-11 ENCOUNTER — Other Ambulatory Visit: Payer: Self-pay | Admitting: Internal Medicine

## 2015-02-11 MED ORDER — HYDROCORTISONE ACE-PRAMOXINE 1-1 % RE CREA: 1.0000 "application " | TOPICAL_CREAM | Freq: Two times a day (BID) | RECTAL | Status: DC

## 2015-05-21 ENCOUNTER — Telehealth: Payer: Self-pay | Admitting: Internal Medicine

## 2015-05-21 NOTE — Telephone Encounter (Signed)
Patient called requesting a med refill on atorvastatin (LIPITOR) 10 MG tablet. Please f/u with pt.

## 2015-05-23 NOTE — Telephone Encounter (Signed)
Interpreter line used  Verlon Au ID# [id number] Patient is aware to contact her pharmacy and ask them to refill her Medication Patient has three refills available

## 2015-08-12 NOTE — Telephone Encounter (Signed)
error 

## 2016-06-07 ENCOUNTER — Ambulatory Visit: Payer: Self-pay

## 2016-07-03 LAB — GLUCOSE, POCT (MANUAL RESULT ENTRY): POC Glucose: 99 mg/dl (ref 70–99)

## 2016-07-26 ENCOUNTER — Ambulatory Visit: Payer: Self-pay | Attending: Internal Medicine

## 2016-08-23 ENCOUNTER — Telehealth: Payer: Self-pay | Admitting: Internal Medicine

## 2016-08-23 ENCOUNTER — Other Ambulatory Visit: Payer: Self-pay | Admitting: Internal Medicine

## 2016-08-23 ENCOUNTER — Encounter: Payer: Self-pay | Admitting: Internal Medicine

## 2016-08-23 ENCOUNTER — Ambulatory Visit: Payer: Self-pay | Attending: Internal Medicine | Admitting: Internal Medicine

## 2016-08-23 VITALS — BP 94/65 | HR 64 | Temp 98.0°F | Resp 16 | Wt 173.2 lb

## 2016-08-23 DIAGNOSIS — R102 Pelvic and perineal pain: Secondary | ICD-10-CM | POA: Insufficient documentation

## 2016-08-23 DIAGNOSIS — K649 Unspecified hemorrhoids: Secondary | ICD-10-CM | POA: Insufficient documentation

## 2016-08-23 DIAGNOSIS — K029 Dental caries, unspecified: Secondary | ICD-10-CM

## 2016-08-23 DIAGNOSIS — Z23 Encounter for immunization: Secondary | ICD-10-CM

## 2016-08-23 DIAGNOSIS — F419 Anxiety disorder, unspecified: Secondary | ICD-10-CM | POA: Insufficient documentation

## 2016-08-23 DIAGNOSIS — R5383 Other fatigue: Secondary | ICD-10-CM | POA: Insufficient documentation

## 2016-08-23 DIAGNOSIS — Z0000 Encounter for general adult medical examination without abnormal findings: Secondary | ICD-10-CM

## 2016-08-23 DIAGNOSIS — F411 Generalized anxiety disorder: Secondary | ICD-10-CM

## 2016-08-23 DIAGNOSIS — B351 Tinea unguium: Secondary | ICD-10-CM | POA: Insufficient documentation

## 2016-08-23 LAB — CMP AND LIVER
ALK PHOS: 51 U/L (ref 33–115)
ALT: 46 U/L — ABNORMAL HIGH (ref 6–29)
AST: 32 U/L — ABNORMAL HIGH (ref 10–30)
Albumin: 4.1 g/dL (ref 3.6–5.1)
BUN: 10 mg/dL (ref 7–25)
Bilirubin, Direct: 0.1 mg/dL (ref <=0.2)
CALCIUM: 9.4 mg/dL (ref 8.6–10.2)
CO2: 26 mmol/L (ref 20–31)
CREATININE: 0.65 mg/dL (ref 0.50–1.10)
Chloride: 106 mmol/L (ref 98–110)
Glucose, Bld: 85 mg/dL (ref 65–99)
Indirect Bilirubin: 0.4 mg/dL (ref 0.2–1.2)
Potassium: 4 mmol/L (ref 3.5–5.3)
Sodium: 140 mmol/L (ref 135–146)
Total Bilirubin: 0.5 mg/dL (ref 0.2–1.2)
Total Protein: 7.6 g/dL (ref 6.1–8.1)

## 2016-08-23 LAB — POCT URINALYSIS DIPSTICK
Bilirubin, UA: NEGATIVE
Blood, UA: NEGATIVE
Glucose, UA: NEGATIVE
Ketones, UA: NEGATIVE
LEUKOCYTES UA: NEGATIVE
NITRITE UA: NEGATIVE
PH UA: 5.5
Protein, UA: NEGATIVE
Spec Grav, UA: 1.025
Urobilinogen, UA: 0.2

## 2016-08-23 LAB — CBC WITH DIFFERENTIAL/PLATELET
Basophils Absolute: 0 cells/uL (ref 0–200)
Basophils Relative: 0 %
Eosinophils Absolute: 73 cells/uL (ref 15–500)
Eosinophils Relative: 1 %
HEMATOCRIT: 40.3 % (ref 35.0–45.0)
Hemoglobin: 13.2 g/dL (ref 11.7–15.5)
LYMPHS PCT: 33 %
Lymphs Abs: 2409 cells/uL (ref 850–3900)
MCH: 29.7 pg (ref 27.0–33.0)
MCHC: 32.8 g/dL (ref 32.0–36.0)
MCV: 90.8 fL (ref 80.0–100.0)
MONO ABS: 511 {cells}/uL (ref 200–950)
MPV: 10.6 fL (ref 7.5–12.5)
Monocytes Relative: 7 %
NEUTROS ABS: 4307 {cells}/uL (ref 1500–7800)
Neutrophils Relative %: 59 %
Platelets: 394 10*3/uL (ref 140–400)
RBC: 4.44 MIL/uL (ref 3.80–5.10)
RDW: 14.3 % (ref 11.0–15.0)
WBC: 7.3 10*3/uL (ref 3.8–10.8)

## 2016-08-23 LAB — LIPID PANEL
CHOLESTEROL: 204 mg/dL — AB (ref <200)
HDL: 37 mg/dL — ABNORMAL LOW (ref >50)
LDL Cholesterol: 113 mg/dL — ABNORMAL HIGH (ref <100)
Total CHOL/HDL Ratio: 5.5 Ratio — ABNORMAL HIGH (ref <5.0)
Triglycerides: 269 mg/dL — ABNORMAL HIGH (ref <150)
VLDL: 54 mg/dL — ABNORMAL HIGH (ref <30)

## 2016-08-23 LAB — TSH: TSH: 5.16 mIU/L — ABNORMAL HIGH

## 2016-08-23 LAB — IRON,TIBC AND FERRITIN PANEL
%SAT: 24 % (ref 11–50)
Ferritin: 35 ng/mL (ref 10–154)
IRON: 102 ug/dL (ref 40–190)
TIBC: 425 ug/dL (ref 250–450)

## 2016-08-23 MED ORDER — SENNOSIDES-DOCUSATE SODIUM 8.6-50 MG PO TABS: 1.0000 | ORAL_TABLET | Freq: Every day | ORAL | 3 refills | Status: DC

## 2016-08-23 MED ORDER — TERBINAFINE HCL 1 % EX CREA: 1.0000 "application " | TOPICAL_CREAM | Freq: Two times a day (BID) | CUTANEOUS | 2 refills | Status: DC

## 2016-08-23 MED ORDER — HYDROCORTISONE ACETATE 25 MG RE SUPP: 25.0000 mg | Freq: Two times a day (BID) | RECTAL | 1 refills | Status: DC

## 2016-08-23 NOTE — Telephone Encounter (Signed)
Patient is needing a referral to a dentist. Please follow up.

## 2016-08-23 NOTE — Telephone Encounter (Signed)
Will forward to pcp

## 2016-08-23 NOTE — Progress Notes (Signed)
Courtney Edwards, is a 41 y.o. female  [id number]  [id number]  DOB - [birth year]  CC:  Chief Complaint  Patient presents with  . Establish Care       HPI: Courtney Edwards is a 41 y.o.HF, right handed, g5p5 (all vaginal deliveries) female here today to establish medical care.  Last seen 6/16 w/ NP, here today to establish care.  C/o of numerous c/os today, including chronic fatigue, low blood pressure and concerns of anemia (noted at community health check at her [facility] in November), left arm pain w/ exertion, intermittently ha that last a couple of hours and resolves w/o n/v/photophobia, hemorrhoids, nail fungal infections, right abd pain occasionally (exacerbated when she bends down to pick up something), and sensation of "things wanting to fall out vaginally" when she is walking.  Her periods typically last about 8 days, initially heavy first 3 days, and than mild/brown coloration. Last menses 08/07/16.  + hemorrhoids, bms irregular.  Not much fiber in diet as recd.  She has 5 kids, but 4 of them are very young <9yo at home.  Denies home stressors, denies si/hi/avh, not interested in any antianxiety rx/depression at this time.  Patient has , No chest pain, No Nausea, No new weakness tingling or numbness, No Cough - SOB.  Denies dizziness, dysuria.  Interpreter was used to communicate directly with patient for the entire encounter including providing detailed patient instructions.   Review of Systems: Per hpi, o/w all systems reviewed and negative.    No Known Allergies Past Medical History:  Diagnosis Date  . Medical history non-contributory   . Mental disorder   . Post partum depression 2002   Current Outpatient Prescriptions on File Prior to Visit  Medication Sig Dispense Refill  . atorvastatin (LIPITOR) 10 MG tablet Take 1 tablet (10 mg total) by mouth daily at 6 PM. (Patient not taking: Reported on 08/23/2016) 90 tablet 3  . calcium-vitamin D 250-100 MG-UNIT per tablet  Take 1 tablet by mouth 2 (two) times daily.    . pramoxine-hydrocortisone (PROCTOCREAM-HC) 1-1 % rectal cream Place 1 application rectally 2 (two) times daily. (Patient not taking: Reported on 08/23/2016) 30 g 0  . terbinafine (LAMISIL) 250 MG tablet Take 1 tablet (250 mg total) by mouth daily. (Patient not taking: Reported on 08/23/2016) 30 tablet 0   No current facility-administered medications on file prior to visit.    Family History  Problem Relation Age of Onset  . Hypertension Mother   . Diabetes Mother   . Heart disease Mother    Social History   Social History  . Marital status: Married    Spouse name: N/A  . Number of children: N/A  . Years of education: N/A   Occupational History  . Not on file.   Social History Main Topics  . Smoking status: Never Smoker  . Smokeless tobacco: Not on file  . Alcohol use No  . Drug use: No  . Sexual activity: No   Other Topics Concern  . Not on file   Social History Narrative  . No narrative on file    Objective:   Vitals:   08/23/16 1001  BP: 94/65  Pulse: 64  Resp: 16  Temp: 98 F (36.7 C)    Filed Weights   08/23/16 1001  Weight: 173 lb 3.2 oz (78.6 kg)    BP Readings from Last 3 Encounters:  08/23/16 94/65  01/27/15 96/62  05/20/14 (!) 112/56    Physical Exam: Constitutional: Patient  appears well-developed and well-nourished. No distress. AAOx3, morbid obese. Pleasant, good eye contact. HENT: Normocephalic, atraumatic, External right and left ear normal. Oropharynx is clear and moist.  Eyes: Conjunctivae and EOM are normal. PERRL, no scleral icterus. Neck: Normal ROM. Neck supple. No JVD. No tracheal deviation. No thyromegaly. CVS: RRR, S1/S2 +, no murmurs, no gallops, no carotid bruit.  Pulmonary: Effort and breath sounds normal, no stridor, rhonchi, wheezes, rales.  Abdominal: Soft. BS +, obese, ttp right pelvic region,  no distension,  NO palpable masses, no rebound or guarding.  Musculoskeletal: Normal  range of motion. No edema and no tenderness.  LE: bilat/ no c/c/e, pulses 2+ bilateral. Lymphadenopathy: No lymphadenopathy noted, cervical Neuro: Alert.  muscle tone coordination wnl. No cranial nerve deficit grossly. Skin: Skin is warm and dry. No rash noted. Not diaphoretic. No erythema. No pallor. Psychiatric: Normal mood and affect. Behavior, judgment, thought content normal.  Lab Results  Component Value Date   WBC 8.1 01/27/2015   HGB 13.2 01/27/2015   HCT 39.7 01/27/2015   MCV 88.4 01/27/2015   PLT 372 01/27/2015   Lab Results  Component Value Date   CREATININE 0.57 01/27/2015   BUN 8 01/27/2015   NA 140 01/27/2015   K 4.8 01/27/2015   CL 107 01/27/2015   CO2 22 01/27/2015    Lab Results  Component Value Date   HGBA1C 5.50 01/27/2015   Lipid Panel     Component Value Date/Time   CHOL 194 01/27/2015 1118   TRIG 169 (H) 01/27/2015 1118   HDL 38 (L) 01/27/2015 1118   CHOLHDL 5.1 01/27/2015 1118   VLDL 34 01/27/2015 1118   LDLCALC 122 (H) 01/27/2015 1118        Depression screen [facility] 2/9 01/27/2015 04/01/2014 03/07/2014  Decreased Interest 0 0 0  Down, Depressed, Hopeless 0 0 0  PHQ - 2 Score 0 0 0   Ct abd 02/24/11 IMPRESSION: Benign-appearing abdomen and pelvis except for a small central disc protrusion at L4-5 asymmetric to the right.  Original Report Authenticated By: Gwynn Burly, M.D.    05/27/14 pelvic Juel Burrow US FINDINGS: Uterus  Measurements: 9.2 x 5.4 x 4.8 cm. Retroverted, retroflexed. No fibroids or other mass visualized.  Endometrium  Thickness: 1.6 cm. Trilaminar in appearance. No focal abnormality visualized.  Right ovary  Measurements: 3.9 x 1.6 x 1.6 cm. Normal appearance/no adnexal mass.  Left ovary  Measurements: 4.2 x 1.4 x 2.0 cm. Normal appearance/no adnexal mass.  Other findings  Small amounts of free fluid with low level internal echoes is present.  IMPRESSION: No acute  abnormality.   Assessment and plan:   1. Pelvic pain in female  - Urinalysis Dipstick - US Transvaginal Non-OB; Future - US Pelvis Complete; Future  2. Initially soft bp, w/ Other fatigue - neg orthostatics today - CBC with Differential - TSH - Iron, TIBC and Ferritin Panel - may need iron supplements.  3. Anxiety state  ? Lots of young kids at home, but pt denied - declined trial elavil today  4. Healthcare maintenance - VITAMIN D 25 Hydroxy (Vit-D Deficiency, Fractures) - CMP and Liver - Lipid Panel  5. Onychomycosis - terbinafine (LAMISIL AT) 1 % cream; Apply 1 application topically 2 (two) times daily.  Dispense: 30 g; Refill: 2  6. Hemorrhoids, unspecified hemorrhoid type - recd high fiber diet, recd Kegel exercises. - hydrocortisone (ANUSOL-HC) 25 MG suppository; Place 1 suppository (25 mg total) rectally 2 (two) times daily.  Dispense: 24 suppository;  Refill: 1   Return in about 4 weeks (around 09/20/2016) for abd pains..  The patient was given clear instructions to go to ER or return to medical center if symptoms don't improve, worsen or new problems develop. The patient verbalized understanding. The patient was told to call to get lab results if they haven't heard anything in the next week.    This note has been created with Education officer, environmentalDragon speech recognition software and smart phrase technology. Any transcriptional errors are unintentional.   Pete Glatterawn T Langeland, MD, MBA/MHA [facility] [place], [place] [phone]   08/23/2016, 11:08 AM

## 2016-08-23 NOTE — Patient Instructions (Addendum)
-  Dieta rica en fibra (High-Fiber Diet) La fibra, tambin llamada fibra dietaria, es un tipo de carbohidrato que se encuentra en las frutas, las verduras, los cereales integrales y los frijoles. Una dieta rica en fibra puede tener muchos beneficios para la salud. El mdico puede recomendar una dieta rica en fibra para ayudar a:  Chief Strategy Officer. La fibra puede hacer que defeque con ms frecuencia.  Disminuir el nivel de colesterol.  Aliviar las hemorroides, la diverticulosis no complicada o el sndrome del intestino irritable.  Evitar comer en exceso como parte de un plan para bajar de peso.  Evitar cardiopatas, la diabetes tipo 2 y ciertos cnceres. EN QU CONSISTE EL PLAN? El consumo diario recomendado de fibra incluye lo siguiente:  38gramos para hombres menores de 50 aos.  30gramos para hombres mayores de [place].  25gramos para mujeres menores de 50 aos.  21gramos para mujeres mayores de [place]. Puede lograr el consumo diario recomendado de fibra si come una variedad de frutas, verduras, cereales y frijoles. El mdico tambin puede recomendar un suplemento de fibra si no es posible obtener suficiente fibra a travs de la dieta. QU DEBO SABER ACERCA DE LA DIETA RICA EN FIBRA?  La eficacia de los suplementos de [place] no ha sido estudiada [place], de modo que es mejor obtener fibra a travs de los alimentos.  Verifique siempre el contenido de fibra en la etiqueta de informacin nutricional de los alimentos preenvasados. Busque alimentos que contengan al menos 5gramos de fibra por porcin.  Consulte al nutricionista si tiene preguntas sobre algunos alimentos especficos relacionados con su enfermedad, especialmente si estos alimentos no se mencionan a continuacin.  Aumente el consumo diario de fibra en forma gradual. Aumentar demasiado rpido el consumo de fibra dietaria puede provocar meteorismo, clicos o gases.  Beber abundante agua. El [place] a  Geophysicist/field seismologist. QU ALIMENTOS PUEDO COMER? Cereales  Panes integrales. Multicereales. Avena. Arroz integral. Gypsy Decant. Trigo burgol. Mijo. Muffins de salvado. Palomitas de maz. Galletas de centeno. Verduras  Batatas. Espinaca. Col rizada. Alc[place]ofas. Repollo. Brcoli. Guisantes. Zanahorias. [place]. Frutas  Frutos rojos. Peras. Manzanas. Naranjas Aguacates. Ciruelas y pasas. Higos secos. Carnes y otras fuentes de protenas  Frijoles blancos, colorados, pintos y porotos de soja. Guisantes secos. Lentejas. Frutos secos y semillas. Lcteos  Yogur fortificado con Research scientist (life sciences). Bebidas  Leche de soja fortificada con Bjorn Loser. Jugo de naranja fortificado con Bjorn Loser. Otros  Barras de [place]. Los artculos mencionados arriba pueden no ser Raytheon de las bebidas o los alimentos recomendados. Comunquese con el nutricionista para conocer ms opciones.  QU ALIMENTOS NO SE RECOMIENDAN? Cereales  Pan blanco. Pastas hechas con Webb Laws. Arroz blanco. Verduras  Papas fritas. Verduras enlatadas. Verduras bien cocidas. Frutas  Jugo de frutas. Frutas cocidas coladas. Carnes y [address] fuentes de protenas  Cortes de carne con Holiday representative. Aves o pescados fritos. Lcteos  Leche. Yogur. Queso crema. PPG Industries. Bebidas  Gaseosas. Otros  Tortas y pasteles. Mantequilla y aceites. Los artculos mencionados arriba pueden no ser Raytheon de las bebidas y los alimentos que se Theatre stage manager. Comunquese con el nutricionista para obtener ms informacin.  ALGUNOS CONSEJOS PARA INCLUIR ALIMENTOS RICOS EN FIBRA EN LA DIETA  Consuma una gran variedad de alimentos ricos en fibra.  Asegrese de que la mitad de todos los cereales consumidos cada da sean cereales integrales.  Reemplace los panes y cereales hechos de harina refinada o harina blanca por panes y cereales integrales.  Reemplace el  arroz blanco por arroz integral, trigo burgol o mijo.  Comience Medical laboratory scientific officer con un desayuno rico en [place],  como un cereal que contenga al menos 5gramos de fibra por porcin.  Use guisantes en lugar de carne en las sopas, ensaladas o pastas.  Coma bocadillos ricos en fibra, como frutos rojos, verduras crudas, frutos secos o palomitas de maz. Esta informacin no tiene Theme park manager el consejo del mdico. Asegrese de hacerle al mdico cualquier pregunta que tenga. Document Released: 08/02/2005 Document Revised: 11/24/2015 Document Reviewed: 01/15/2014 Elsevier Interactive Patient Education  2017 Elsevier Inc.  -  Hemorroides (Hemorrhoids) Las hemorroides son venas inflamadas adentro o alrededor del recto o del ano. Hay dos tipos de hemorroides:  Hemorroides internas. Se forman en las venas del interior del recto. Pueden abultarse hacia afuera, irritarse y doler.  Hemorroides externas. Se producen en las venas externas del ano y pueden sentirse como un bulto o zona hinchada, dura y dolorosa cerca del ano. La mayora de las hemorroides no causan problemas graves y se Sports coach con tratamientos caseros [employer] cambios en la dieta y el estilo de vida. Si los tratamientos caseros no ayudan con los sntomas, se pueden Education officer, environmental procedimientos para reducir o extirpar las hemorroides. CAUSAS La causa de esta afeccin es el aumento de la presin en la zona anal. Esta presin puede ser causada por distintos factores, por ejemplo:  Estreimiento.  Dificultad para defecar.  Diarrea.  Embarazo.  Obesidad.  Estar sentado durante largos perodos.  Levantar objetos pesados u otras actividades que impliquen esfuerzo.  Sexo anal. SNTOMAS Los sntomas de esta afeccin incluyen lo siguiente:  Dolor.  Picazn o irritacin anal.  Sangrado rectal.  Prdida de materia fecal (heces).  Inflamacin anal.  Uno o ms bultos alrededor del ano. DIAGNSTICO Esta afeccin se diagnostica frecuentemente a travs de un examen visual. Posiblemente le realicen otros tipos de pruebas o  estudios, como los siguientes:  Examen de la zona rectal con una mano enguantada (examen digital rectal).  Examen del canal anal utilizando un pequeo tubo (anoscopio).  Anlisis de sangre si ha perdido [place] cantidad significativa de [place].  Un estudio para observar el interior del colon (sigmoidoscopia o colonoscopia). TRATAMIENTO Esta afeccin generalmente se puede tratar en el hogar. Se pueden realizar diversos procedimientos si los cambios en la dieta, en el estilo de vida y otros tratamientos caseros no [employer]. Estos procedimientos pueden ayudar a reducir o extirpar las hemorroides completamente. Algunos de estos procedimientos son quirrgicos y otros no. Algunos de los procedimientos ms frecuentes son los siguientes:  Ligadura con Curator. Las bandas elsticas se colocan en la base de las hemorroides para interrumpir la irrigacin de [place].  Escleroterapia. Se inyecta un medicamento en las hemorroides para reducir su tamao.  Coagulacin con luz infrarroja. Se utiliza un tipo de energa lumnica para eliminar las hemorroides.  Hemorroidectoma. Las hemorroides se extirpan con [place] y las venas que las [place] se Web designer.  Hemorroidopexia con grapas. Se [place] un dispositivo tipo grapa de forma circular para extirpar las hemorroides y unas grapas para cortar la sangre que se irriga hacia las hemorroides. INSTRUCCIONES PARA EL CUIDADO EN EL HOGAR Comida y bebida   Consuma alimentos con alto contenido de [place], como cereales integrales, porotos, frutos secos, frutas y verduras. Pregntele a su mdico acerca de tomar productos con fibra aadida en ellos (complementos de fibra).  Beba suficiente lquido para Photographer orina clara o de color amarillo plido. Control del dolor y  la hinchazn   Tome baos de asiento tibios durante 20 minutos, 3 o 4 veces por da para Primary school teacher y las [place].  Si se lo indican, aplique hielo en la zona afectada. Usar compresas  de [employer] baos de asiento puede ser [place].  Ponga el hielo en una bolsa plstica.  Coloque una toalla entre la piel y la bolsa de hielo.  Coloque el hielo durante 20 minutos, 2 a 3 veces por da. Instrucciones generales   Baxter International de venta libre y los recetados solamente como se lo haya indicado el mdico.  Aplquese los medicamentos, cremas o supositorios como se lo hayan indicado.  Haga ejercicios regularmente.  Vaya al bao cuando sienta la necesidad de defecar. No espere.  Evite hacer fuerza al defecar.  Mantenga la zona anal limpia y seca. Use papel higinico hmedo o toallitas humedecidas despus de defecar.  No pase mucho tiempo sentado en el inodoro. Esto aumenta la afluencia de sangre y Chief Technology Officer. SOLICITE ATENCIN MDICA SI:  Aumenta el dolor y la hinchazn, y no puede controlarlos con los medicamentos o con Pharmacist, community.  Tiene una hemorragia que no Magazine features editor.  No puede defecar o lo hace con dificultad.  Siente dolor o tiene inflamacin fuera de la zona de las hemorroides. Esta informacin no tiene Theme park manager el consejo del mdico. Asegrese de hacerle al mdico cualquier pregunta que tenga. Document Released: 08/02/2005 Document Revised: 11/24/2015 Document Reviewed: 04/16/2015 Elsevier Interactive Patient Education  2017 [employer]vin[employer]itor.  -   Ejercicios de Kegel (Kegel Exercises) Los ejercicios de Kegel ayudan a fortalecer los msculos que sostienen el recto, la vagina, el intestino delgado, la vejiga y [place]. Los ejercicios de Kegel pueden ayudar a lo siguiente:  Mejorar el control de la vejiga y de los intestinos.  Mejorar la respuesta sexual.  Reducir los problemas o las molestias durante el [place]. Los ejercicios de Kegel implican apretar los msculos del suelo plvico, que son los mismos msculos que comprime cuando trata de TEFL teacher el flujo de la [place]. Los ejercicios se pueden Ecologist est  sentado, parado o [place], pero lo mejor es variar la posicin. EJERCICIOS DE KEGEL 1. Apriete bien los msculos del suelo plvico. Debera sentir una elevacin firme en el rea del recto. Si es [place], tambin debera sentir una compresin en el rea de la vagina. Mantenga el [place], las nalgas y las piernas [address]. 2. Mantenga los msculos apretados durante 10segundos como mximo. 3. Relaje los msculos. Repita este ejercicio 50veces al da o como se lo haya indicado el mdico. Contine haciendo este ejercicio durante al menos 4 a 6semanas y Therapist, sports tiempo que le haya indicado el mdico. Esta informacin no tiene Theme park manager el consejo del mdico. Asegrese de hacerle al mdico cualquier pregunta que tenga. Document Released: 07/19/2012 Document Revised: 06/22/2015 Document Reviewed: 06/22/2015 Elsevier Interactive Patient Education  2017 [employer]itor.   - Mantenimiento de la salud - Mujeres (Health Maintenance, Female) Un estilo de vida saludable y los cuidados preventivos pueden favorecer considerablemente a la salud y Counsellor. Pregunte a su mdico cul es el cronograma de exmenes peridicos apropiado para usted. Esta es una buena oportunidad para consultarlo sobre cmo prevenir enfermedades y [place] sano. Adems de los controles, hay muchas otras cosas que puede hacer usted mismo. Los expertos han realizado numerosas investigaciones United Stationers cambios en el estilo de vida y las medidas de prevencin que, [place], lo ayudarn a [employer] Technologies Corporation  sano. Solicite a su mdico ms informacin. EL PESO Y LA DIETA Consuma una dieta saludable.   Asegrese de [employer] Stores verduras, frutas, productos lcteos de bajo contenido de [place] y Advertising account planner.  No consuma muchos alimentos de alto contenido de grasas slidas, azcares agregados o sal.  Realice actividad fsica con regularidad. Esta es una de las prcticas ms importantes que puede hacer por su salud.  La  mayora de los adultos deben hacer ejercicio durante al menos 12mnutos por semana. El ejercicio debe aumentar la frecuencia cardaca y pActorla transpiracin (ejercicio de [place].  La mayora de los adultos tambin deben hField seismologistejercicios de elongacin al mToysRusveces a la semana. Agregue esto al su plan de ejercicio de intensidad moderada. Mantenga un peso saludable.   El ndice de masa corporal ([facility] es una medida que puede utilizarse para identificar posibles problemas de pMarrowstone Proporciona una estimacin de la grasa corporal basndose en el peso y la altura. Su mdico puede ayudarle a dRadiation protection practitionerISavannahy a lScientist, forensico mTheatre managerun peso saludable.  Para las mujeres de 20aos o ms:  Un ISt Francis Mo[facility] Surgery Center LLCmenor de 18,5 se considera bajo peso.  Un IAdvanced Center For Surgery LLCentre 18,5 y 24,9 es normal.  Un ISouth Alabama Outpatient Servicesentre 25 y 29,9 se considera sobrepeso.  Un IMC de 30 o ms se considera obesidad. Observe los niveles de colesterol y lpidos en la sangre.   Debe comenzar a rEnglish as a second language teacherde lpidos y cResearch officer, trade unionen la sangre a los 20aos y luego repetirlos cada 567aos  Es posible que nAutomotive engineerlos niveles de colesterol con mayor frecuencia si:  Sus niveles de lpidos y colesterol son altos.  Es mayor de 50aos.  Presenta un alto riesgo de padecer enfermedades cardacas. DETECCIN DE CNCER Cncer de pulmn   Se recomienda realizar exmenes de deteccin de cncer de pulmn a personas adultas entre 549y 86aos que estn en riesgo de dHorticulturist, commercialde pulmn por sus antecedentes de consumo de tabaco.  Se recomienda una tomografa computarizada de baja dosis de los pLiberty Mediaaos a las personas que:  Fuman actualmente.  Hayan dejado el hbito en algn momento en los ltimos 15aos.  Hayan fumado durante 30aos un paquete diario. Un paquete-ao equivale a fumar un promedio de un paquete de cigarrillos diario durante un ao.  Los exmenes de deteccin anuales deben continuar  hasta que hayan pasado 15aos desde que dej de fumar.  Ya no debern realizarse si tiene un problema de salud que le impida recibir tratamiento para eScience writerde pulmn. Cncer de mama   Practique la autoconciencia de la mama. Esto significa reconocer la apariencia normal de sus mamas y cmo las siente.  Tambin significa realizar autoexmenes regulares de lJohnson & Johnson Informe a su mdico sobre cualquier cambio, sin importar cun pequeo sea.  Si tiene entre 20 y 389aos, un mdico debe realizarle un examen clnico de las mamas como parte del examen regular de s[place] cada 1 a 3aos.  Si tiene 40aos o ms, debe rInformation systems managerclnico de las mMicrosoft Tambin considere realizarse una rAmory(mManasota Key todos los aFl[place]  Si tiene antecedentes familiares de cncer de mama, hable con su mdico para someterse a un estudio gentico.  Si tiene alto riesgo de pChief Financial Officerde mama, hable con su mdico para someterse a uPublic house managery u3M Company  La evaluacin del gen del cncer de mama (BRCA) se recomienda a  mujeres que tengan familiares con cnceres relacionados con el BRCA. Los cnceres relacionados con el BRCA incluyen los siguientes:  Mama.  Ovario.  Trompas.  Cnceres de peritoneo.  Los resultados de la evaluacin determinarn la necesidad de asesoramiento gentico y de [place] de BRCA1 y BRCA2. Cncer de cuello del tero  El mdico puede recomendarle que se haga pruebas peridicas de deteccin de cncer de los rganos de la pelvis (ovarios, tero y vagina). Estas pruebas incluyen un examen plvico, que abarca controlar si se produjeron cambios microscpicos en la superficie del cuello del tero (prueba de Papanicolaou). Pueden recomendarle que se haga estas pruebas cada 3aos, a partir de los 21aos.  A las mujeres que tienen entre 30 y 65aos, los mdicos pueden recomendarles que se sometan a exmenes plvicos y  pruebas de Papanicolaou cada 3aos, o a la prueba de Papanicolaou y el examen plvico en combinacin con estudios de deteccin del virus del papiloma humano (VPH) cada 5aos. Algunos tipos de VPH aumentan el riesgo de Geneticist, molecular de cuello del tero. La prueba para la deteccin del VPH tambin puede realizarse a mujeres de cualquier edad cuyos resultados de la prueba de Papanicolaou no sean claros.  Es posible que otros mdicos no recomienden exmenes de deteccin a mujeres no embarazadas que se consideran sujetos de bajo riesgo de Geneticist, molecular de pelvis y que no tienen sntomas. Pregntele al mdico si un examen plvico de deteccin es adecuado para usted.  Si ha recibido un tratamiento para Management consultant cervical o una enfermedad que podra causar cncer, necesitar realizarse una prueba de Papanicolaou y controles durante al menos 20 aos de concluido el [place]. Si no se ha hecho el Papanicolaou con regularidad, debern volver a evaluarse los factores de riesgo (como tener un nuevo compaero sexual), para Chief Strategy Officer si debe realizarse los estudios nuevamente. Algunas mujeres sufren problemas mdicos que aumentan la probabilidad de Primary school teacher cncer de cuello del tero. En estos casos, el mdico podr News Corporation se realicen controles y pruebas de Papanicolaou con ms frecuencia. Cncer colorrectal   Este tipo de cncer puede detectarse y a menudo prevenirse.  Por lo general, los estudios de rutina se deben Games developer a Radio producer a Glass blower/designer de los 50 aos y [place] 75 aos.  Sin embargo, el mdico podr aconsejarle que lo haga antes, si tiene factores de riesgo para el cncer de colon.  Tambin puede recomendarle que use un kit de prueba para Scientist, product/process development en la materia fecal.  Es posible que se use una pequea cmara en el extremo de un tubo para examinar directamente el colon (sigmoidoscopia o colonoscopia) a fin de Engineer, manufacturing formas tempranas de cncer colorrectal.  Los exmenes de rutina  generalmente comienzan a los 50aos.  El examen directo del colon se debe repetir cada 5 a 10aos hasta los 75aos. Sin embargo, es posible que se realicen exmenes con mayor frecuencia, si se detectan formas tempranas de plipos precancerosos o pequeos bultos. Cncer de piel   Revise la piel de la cabeza a los pies con regularidad.  Informe a su mdico si aparecen nuevos lunares o los que tiene se modifican, especialmente en su forma y color.  Tambin notifique al mdico si tiene un lunar que es ms grande que el tamao de una goma de lpiz.  Siempre use pantalla solar. Aplique pantalla solar de Pietro Cassis y repetida a lo largo del Futures trader.  Protjase usando mangas y Automatic Data, un sombrero de ala ancha y gafas para el sol,  siempre que se encuentre en el exterior. ENFERMEDADES CARDACAS, DIABETES E HIPERTENSIN ARTERIAL  La hipertensin arterial causa enfermedades cardacas y [place] el riesgo de ictus. La hipertensin arterial es ms probable en los siguientes casos:  Las personas que tienen la presin arterial en el extremo del rango normal (100-139/85-89 mm Hg).  Las personas con sobrepeso u obesidad.  Las Statistician.  Si usted tiene entre 18 y 39 aos, debe medirse la presin arterial cada 3 a 5 aos. Si usted tiene 40 aos o ms, debe medirse la presin arterial [employer]. Debe medirse la presin arterial dos veces: una vez cuando est en un hospital o una clnica y la otra vez cuando est en otro sitio. Registre el promedio de Johnson Controls. Para controlar su presin arterial cuando no est en un hospital o Paulita Cradle, puede usar lo siguiente:  Neomia Dear mquina automtica para medir la presin arterial en una farmacia.  Un monitor para medir la presin arterial en el hogar.  Si tiene entre 55 y 70 aos, consulte a su mdico si debe tomar aspirina para prevenir el ictus.  Realcese exmenes de deteccin de la diabetes con regularidad. Esto incluye la  toma de [place] de sangre para controlar el nivel de azcar en la sangre durante el [place].  Si tiene un peso normal y un bajo riesgo de padecer diabetes, realcese este anlisis cada tres aos despus de los 45aos.  Si tiene sobrepeso y un alto riesgo de padecer diabetes, considere someterse a este anlisis antes o con mayor frecuencia. PREVENCIN DE INFECCIONES HepatitisB   Si tiene un riesgo ms alto de Primary school teacher hepatitis B, debe someterse a un examen de deteccin de este virus. Se considera que tiene un alto riesgo de Primary school teacher hepatitis B si:  Naci en un pas donde la hepatitis B es frecuente. Pregntele a su mdico qu pases son considerados de Conservator, museum/gallery.  Sus padres nacieron en un pas de alto riesgo y usted no recibi una vacuna que lo proteja contra la hepatitis B (vacuna contra la hepatitis B).  Tiene VIH o sida.  [place] agujas para inyectarse drogas.  Vive con alguien que tiene hepatitis B.  Ha tenido sexo con alguien que tiene hepatitis B.  Recibe tratamiento de hemodilisis.  Toma ciertos medicamentos para el cncer, trasplante de rganos y afecciones autoinmunitarias. Hepatitis C   Se recomienda un anlisis de [place] para:  Todos los que nacieron entre 1945 y [phone].  Todas las personas que tengan un riesgo de haber contrado hepatitis C. Enfermedades de transmisin sexual (ETS).   Debe realizarse pruebas de deteccin de enfermedades de transmisin sexual (ETS), incluidas gonorrea y clamidia si:  Es sexualmente activo y es menor de 24aos.  Es mayor de 24aos, y Public affairs consultant informa que corre riesgo de tener este tipo de infecciones.  La actividad sexual ha cambiado desde que le hicieron la ltima prueba de deteccin y tiene un riesgo mayor de Warehouse manager clamidia o Copy. Pregntele al mdico si usted tiene riesgo.  Si no tiene el VIH, pero corre riesgo de infectarse por el virus, se recomienda tomar diariamente un medicamento recetado para evitar la  infeccin. Esto se conoce como profilaxis previa a la exposicin. Se considera que est en riesgo si:  Es [place] sexualmente y no [place] preservativos habitualmente o no conoce el estado del VIH de sus Chief Strategy Officer.  Se inyecta drogas.  Es [place] sexualmente con Neomia Dear pareja que tiene VIH. Consulte a su mdico para  saber si tiene un alto riesgo de infectarse por el VIH. Si opta por comenzar la profilaxis previa a la exposicin, primero debe realizarse anlisis de deteccin del VIH. Luego, le harn anlisis cada mientras est tomando los medicamentos para la profilaxis previa a la exposicin. [facility]  Si es premenopusica y puede quedar [place], solicite a su mdico asesoramiento previo a la concepcin.  Si puede quedar embarazada, tome 400 a (mcg) de cido [employer].  Si desea evitar el embarazo, hable con su mdico sobre el control de la natalidad (anticoncepcin). OSTEOPOROSIS Y MENOPAUSIA  La osteoporosis es una enfermedad en la que los huesos pierden los minerales y la fuerza por el avance de la edad. El resultado pueden ser fracturas graves en los [place]. El riesgo de osteoporosis puede identificarse con Neomia Dear prueba de densidad sea.  Si tiene 65aos o ms, o si est en riesgo de sufrir osteoporosis y fracturas, pregunte a su mdico si debe someterse a exmenes.  Consulte a su mdico si debe tomar un suplemento de calcio o de vitamina D para reducir el riesgo de osteoporosis.  La menopausia puede presentar ciertos sntomas fsicos y Herbalist.  La terapia de reemplazo hormonal puede reducir algunos de estos sntomas y Herbalist. Consulte a su mdico para saber si la terapia de reemplazo hormonal es conveniente para usted. INSTRUCCIONES PARA EL CUIDADO EN EL HOGAR  Realcese los estudios de rutina de [address], dentales y de Wellsite geologist.  Mantngase al da con las vacunas.  No consuma ningn producto que contenga tabaco, lo que incluye cigarrillos,  tabaco de Theatre manager o Administrator, Civil Service.  Si est embarazada, no beba alcohol.  Si est amamantando, reduzca el consumo de alcohol y la frecuencia con la que consume.  Si es mujer y no est embarazada limite el consumo de alcohol a no ms de 1 medida por da. Una medida equivale a 12onzas de cerveza, 5onzas de vino o 1onzas de bebidas alcohlicas de alta graduacin.  No consuma drogas.  No comparta agujas.  Solicite ayuda a su mdico si necesita apoyo o informacin para abandonar las drogas.  Informe a su mdico si a menudo se siente deprimido.  Notifique a su mdico si alguna vez ha sido vctima de abuso o si no se siente seguro en su hogar. Esta informacin no tiene Theme park manager el consejo del mdico. Asegrese de hacerle al mdico cualquier pregunta que tenga. Document Released: 07/22/2011 Document Revised: 08/23/2014 Document Reviewed: 05/06/2015 Elsevier Interactive Patient Education  2017 Elsevier Inc. Madilyn Fireman Td (contra la difteria y el ttanos): Lo que debe saber (Td Vaccine [place] and Diphtheria]: What You Need to Know) 1. Por qu vacunarse? El ttanos y la difteria son enfermedades muy graves. Son Academic librarian frecuentes en los [place] actualmente, pero las personas que se infectan suelen tener complicaciones graves. La vacuna Td se [place] para proteger a los adolescentes y a los adultos de ambas enfermedades. Tanto el ttanos como la difteria son infecciones causadas por bacterias. La difteria se transmite de persona a persona a travs de la tos o el estornudo. La bacteria que causa el ttanos entra al cuerpo a travs de cortes, raspones o heridas. El TTANOS (trismo) provoca entumecimiento y Engineer, materials dolorosa de los msculos, por lo general, en todo el cuerpo.  Puede causar el endurecimiento de los msculos de la cabeza y el cuello, de modo que impide abrir la boca, tragar y en algunos casos, Industrial/product designer. El ttanos es causa de [place] en aproximadamente  1de cada  10personas que contraen la infeccin, incluso despus de que reciben la mejor atencin mdica. La DIFTERIA puede hacer que se forme una membrana gruesa en la parte posterior de la garganta.  Puede causar problemas respiratorios, parlisis, insuficiencia cardaca e incluso la muerte. Antes de las vacunas, en los [place] se informaban 200000 casos de difteria y cientos de casos de ttanos cada ao. Desde que comenz la vacunacin, los informes de casos de ambas enfermedades se han reducido en un 99%. 2. Edward Jolly Td La vacuna Td protege a adolescentes y adultos contra el ttanos y la difteria. La vacuna Td habitualmente se aplica como dosis de refuerzo cada 10aos, pero tambin puede administrarse antes si la persona sufre una [place] o herida sucia y grave. A veces, en lugar de la vacuna Td, se recomienda una vacuna llamada Tdap, que protege contra la tosferina, adems de proteger contra el ttanos y la difteria. El mdico o la persona que le aplique la vacuna puede darle ms informacin al [employer]. La Td puede administrarse de manera segura simultneamente con otras vacunas. 3. Algunas personas no deben recibir la vacuna  Una persona que alguna vez ha tenido una reaccin alrgica potencialmente mortal a una dosis anterior de cualquier vacuna contra el ttanos o la difteria, O que tenga una alergia grave a cualquier parte de esta vacuna, no debe recibir la vacuna Td. Informe a la persona que le aplica la vacuna si usted tiene cualquier alergia grave.  Consulte con su mdico si:  tuvo hinchazn o dolor intenso despus de recibir cualquier vacuna contra la difteria o el ttanos,  alguna vez ha sufrido el sndrome de [place],  no se siente Pharmacologist en que se ha programado la vacuna. 4. [place] reaccin a la vacuna Con cualquier medicamento, incluyendo las vacunas, existe la posibilidad de que aparezcan efectos secundarios. Suelen ser leves y desaparecen por s solos.  Tambin son posibles las reacciones graves, pero en raras ocasiones. [place] personas a las que se les aplica la vacuna Td no tienen ningn problema. Problemas leves despus de la vacuna Td: (No interfirieron en otras actividades)  Dolor en el lugar donde se aplic la vacuna (alrededor de 8de cada 10personas)  Enrojecimiento o hinchazn en el lugar donde se aplic la vacuna (alrededor de 1de cada 4personas)  Fiebre leve (poco frecuente)  Dolor de Pensions consultant (alrededor de 1de cada 4personas)  Cansancio (alrededor de 1de cada 4personas) Problemas moderados despus de la vacuna Td: (Interfirieron en otras actividades, pero no requirieron atencin mdica)  Fiebre superior a 102F (38,8C) (poco frecuente) Problemas graves despus de la vacuna Td: (Impidieron Optometrist las actividades habituales; requirieron atencin mdica)  Clinical cytogeneticist, dolor intenso, sangrado o enrojecimiento en el brazo en que se aplic la vacuna (poco frecuente). Problemas que podran ocurrir despus de cualquier vacuna:   Las personas a veces se desmayan despus de un procedimiento mdico, incluida la vacunacin. Si permanece sentado o recostado durante 15 minutos puede ayudar a [employer] y las lesiones causadas por las cadas. Informe al mdico si se siente mareado, tiene cambios en la visin o zumbidos en los odos.  Algunas personas sienten un dolor intenso en el hombro y tienen dificultad para mover el brazo donde se coloc la vacuna. Esto sucede con muy poca frecuencia.  Cualquier medicamento puede causar una reaccin alrgica grave. Dichas reacciones son Orlene Erm poco frecuentes con una vacuna (se calcula que menos de 1en un milln de dosis) y se  producen de unos minutos a unas horas despus de Writer. Al igual que con cualquier [employer], existe una probabilidad muy remota de que una vacuna cause una lesin grave o la [place]. Se controla permanentemente la seguridad de las  vacunas. Para obtener ms informacin, visite: http://www.aguilar.org/. 5. Qu pasa si hay una reaccin grave? A qu signos debo estar atento?   Observe todo lo que le preocupe, como signos de una reaccin alrgica grave, fiebre muy alta o comportamiento fuera de lo normal. Los signos de una reaccin alrgica grave pueden incluir ronchas, hinchazn de la cara y la garganta, dificultad para respirar, latidos cardacos acelerados, mareos y debilidad. Generalmente, estos comenzaran entre unos pocos minutos y algunas horas despus de la vacunacin. Qu debo hacer?   Si usted piensa que se trata de una reaccin alrgica grave o de otra emergencia que no puede esperar, llame al 911 o dirjase al hospital ms cercano. Sino, llame a su mdico.  Despus, la reaccin debe informarse al Sistema de Informacin sobre Efectos Adversos de las [place] (Vaccine Adverse Event Reporting System, VAERS). Su mdico puede presentar este informe, o puede hacerlo usted mismo a travs del sitio web de VAERS, en [email or web address].[email or web address].es, o llamando al [phone]. VAERS no brinda recomendaciones mdicas.  6. [place] Compensacin de Daos por [place] de Compensacin de Daos por Clinical biochemist (National Vaccine Injury Compensation Program, VICP) es un programa federal que fue creado para Patent examiner a las personas que puedan haber sufrido daos al recibir ciertas vacunas. Aquellas personas que consideren que han sufrido un dao como consecuencia de una vacuna y [place] saber ms acerca del programa y de cmo presentar [place], pueden llamar al [phone] o visitar su sitio web en [email or web address]. Hay un lmite de tiempo para presentar un reclamo de compensacin. 7. Cmo puedo obtener ms informacin?  Consulte a su mdico. Este puede darle el prospecto de la vacuna o recomendarle otras fuentes de informacin.  Comunquese con el servicio de salud de su localidad o su  estado.  Comunquese con los Centros para Building surveyor y la Prevencin de Probation officer for Disease Control and Prevention , CDC).  Llame al [phone] (1-800-CDC-INFO).  Visite el sitio Biomedical engineer en http://hunter.com/. Declaracin de informacin sobre la vacuna contra la difteria y el ttanos (Td) de los CDC (11/25/15) Esta informacin no tiene Marine scientist el consejo del mdico. Asegrese de hacerle al mdico cualquier pregunta que tenga. Document Released: 11/18/2008 Document Revised: 08/23/2014 Document Reviewed: 11/25/2015 Elsevier Interactive Patient Education  2017 [employer].

## 2016-08-24 ENCOUNTER — Other Ambulatory Visit: Payer: Self-pay | Admitting: Internal Medicine

## 2016-08-24 LAB — VITAMIN D 25 HYDROXY (VIT D DEFICIENCY, FRACTURES): Vit D, 25-Hydroxy: 28 ng/mL — ABNORMAL LOW (ref 30–100)

## 2016-08-24 MED ORDER — LEVOTHYROXINE SODIUM 25 MCG PO TABS: 25.0000 ug | ORAL_TABLET | Freq: Every day | ORAL | 3 refills | Status: DC

## 2016-08-24 MED ORDER — ATORVASTATIN CALCIUM 20 MG PO TABS: 20.0000 mg | ORAL_TABLET | Freq: Every day | ORAL | 3 refills | Status: DC

## 2016-08-24 NOTE — Telephone Encounter (Signed)
-----   Message from Pete Glatterawn T Langeland, MD sent at 08/24/2016  2:15 PM EST ----- She is only mild vit d def.  Please call.  Vit D very low. Very low vit d, can cause bone/muscle pain.   buy OTC Vit D 5,000 IU and take daily.  Kidney fn nml, but her liver enzymes are slightly elevated.  She does not drink, right? Please confirm.  Cholesterol screening all elevated. Was she taking her cholesterol medicine, atorvastatin 10 b/c her cholesterol looks worse than last year. I increased her atorvastatin to 20 mg qhs, if she still has 10mg  tabs, double the dose until she picks up the new rx of 20mg  tabs.  Blood count good. thanks

## 2016-08-24 NOTE — Telephone Encounter (Signed)
-----   Message from Pete Glatterawn T Langeland, MD sent at 08/24/2016  2:19 PM EST ----- CORRECTION WITH ADDITIONS; Her thyroid is off, may be contributing to weight gain. I added synthroid 25mcg daily to help with this, rechk in few months the levels. She is only mild vit d def.  Please call.  Vit D very low. Very low vit d, can cause bone/muscle pain.   buy OTC Vit D 5,000 IU and take daily.  Kidney fn nml, but her liver enzymes are slightly elevated.  She does not drink, right? Please confirm.  Cholesterol screening all elevated. Was she taking her cholesterol medicine, atorvastatin 10 b/c her cholesterol looks worse than last year. I increased her atorvastatin to 20 mg qhs, if she still has 10mg  tabs, double the dose until she picks up the new rx of 20mg  tabs.  Blood count good. thanks

## 2016-08-24 NOTE — Telephone Encounter (Signed)
Patient Verify DOB  Patient was aware and understood  of her lab results.   Patient did not have any further questions

## 2016-08-26 ENCOUNTER — Encounter (HOSPITAL_COMMUNITY): Payer: Self-pay

## 2016-08-26 ENCOUNTER — Ambulatory Visit (HOSPITAL_COMMUNITY)
Admission: RE | Admit: 2016-08-26 | Discharge: 2016-08-26 | Disposition: A | Discharge status: Home / Self Care | Payer: Self-pay | Source: Ambulatory Visit | Attending: Internal Medicine | Admitting: Internal Medicine

## 2016-08-26 DIAGNOSIS — N854 Malposition of uterus: Secondary | ICD-10-CM | POA: Insufficient documentation

## 2016-08-26 DIAGNOSIS — R102 Pelvic and perineal pain: Secondary | ICD-10-CM | POA: Insufficient documentation

## 2016-08-30 ENCOUNTER — Telehealth: Payer: Self-pay

## 2016-08-30 NOTE — Telephone Encounter (Signed)
[employer] ArnoldFernado Id: [id number] contacted pt to go over lab results pt didn't answer lvm regarding us results and if she has any questions or concerns to give me a call  If patient calls back please give results: Vag/pelvic us pretty nml, nothing to point towards pelvic pain.

## 2016-09-13 ENCOUNTER — Telehealth: Payer: Self-pay | Admitting: Internal Medicine

## 2016-09-13 NOTE — Telephone Encounter (Signed)
I called patient to inform her that her last appt was to establish care with Dr. Julien NordmannLangeland since Dr. Luna GlasgowKeck no longer works here. I asked her to calls us back to schedule an appt.

## 2016-09-27 ENCOUNTER — Ambulatory Visit: Payer: Self-pay | Attending: Internal Medicine | Admitting: Internal Medicine

## 2016-09-27 VITALS — BP 104/70 | HR 63 | Temp 98.4°F | Resp 16 | Wt 172.2 lb

## 2016-09-27 DIAGNOSIS — R109 Unspecified abdominal pain: Secondary | ICD-10-CM

## 2016-09-27 DIAGNOSIS — R1013 Epigastric pain: Secondary | ICD-10-CM

## 2016-09-27 DIAGNOSIS — Z0000 Encounter for general adult medical examination without abnormal findings: Secondary | ICD-10-CM | POA: Insufficient documentation

## 2016-09-27 DIAGNOSIS — R102 Pelvic and perineal pain unspecified side: Secondary | ICD-10-CM

## 2016-09-27 DIAGNOSIS — E039 Hypothyroidism, unspecified: Secondary | ICD-10-CM

## 2016-09-27 DIAGNOSIS — E785 Hyperlipidemia, unspecified: Secondary | ICD-10-CM

## 2016-09-27 DIAGNOSIS — Z79899 Other long term (current) drug therapy: Secondary | ICD-10-CM | POA: Insufficient documentation

## 2016-09-27 DIAGNOSIS — K5900 Constipation, unspecified: Secondary | ICD-10-CM

## 2016-09-27 DIAGNOSIS — E669 Obesity, unspecified: Secondary | ICD-10-CM | POA: Insufficient documentation

## 2016-09-27 MED ORDER — PANTOPRAZOLE SODIUM 40 MG PO TBEC: 40.0000 mg | DELAYED_RELEASE_TABLET | Freq: Every day | ORAL | 3 refills | Status: DC

## 2016-09-27 MED ORDER — POLYETHYLENE GLYCOL 3350 17 GM/SCOOP PO POWD: 17.0000 g | Freq: Two times a day (BID) | ORAL | 1 refills | Status: DC | PRN

## 2016-09-27 NOTE — Patient Instructions (Addendum)
Colesterol (Cholesterol) El colesterol esuna grasa. El organismo necesita una pequea cantidad de colesterol. El colesterol puede acumularse en los vasos sanguneos, lo que aumenta la probabilidad de tener un ataque cardaco o un ictus. Los niveles de colesterol no pueden percibirse. La nica forma de saber que el colesterol est alto es mediante un anlisis de [place]. Guarde los [employer]. Trabaje con el mdico para Engineer, maintenance (IT) en un nivel adecuado. QU SIGNIFICAN LOS RESULTADOS DEL ANLISIS?  El colesterol total es la cantidad de colesterol que hay en la [place].  El LDL es el colesterol [place], y es el tipo que puede acumularse. Lo deseable es mantenerlo bajo.  El HDL es el colesterol bueno. Limpia los vasos sanguneos y elimina el colesterol LDL. Lo deseable es que el colesterol HDL sea alto.  Los triglicridos son grasas que el organismo puede [place], ya sea para energa o para almacenamiento. CULES SON LOS NIVELES BUENOS DE COLESTEROL?  El colesterol total por debajo de 200.  El LDL por debajo de 100 en las personas que corren riesgo. Por debajo de 70 en las personas que corren un riesgo muy alto.  El HDL por encima de 50 es aceptable. Por encima de 60 es mejor.  Los triglicridos por debajo de 150. CMO PUEDO BAJAR EL COLESTEROL?  Dieta. Siga los programas de alimentacin que el mdico le indique.  Elija el pescado, la carne blanca de pollo, el pavo asado u horneado. Intente no comer carnes rojas, alimentos fritos o carnes procesadas, como salchichas y embutidos.  Coma gran cantidad de frutas y verduras frescas.  Elija los cereales integrales, los frijoles, las pastas, las papas y los cereales.  Use solamente pequeas cantidades de aceite de oliva, maz o canola.  Intente no comer [place], [place], [place] o aceites de [place].  Intente no comer alimentos que contengan grasas trans.  Beba leche descremada o sin grasa. Coma yogur y quesos  bajos en Steffanie [place] o sin grasa. Intente no consumir leche entera o crema. Intente no comer helados, yemas de huevo y quesos enteros.  Los postres sanos incluyen la torta ngel, los bocadillos de [place], las Gaffer con forma de [place], los caramelos duros, los helados de agua y el yogur bajo en grasa o sin grasa. Intente no comer masas, tortas, pasteles y galletas.  Haga actividad fsica. Siga el programa de actividad fsica segn las indicaciones del mdico.  Sea ms [place]. Puede intentar hacer jardinera, hacer caminatas o subir las escaleras. Consulte al mdico cmo puede aumentar la [place].  Medicamentos. Tome los [employer] Parcel como le indic su mdico. Esta informacin no tiene Theme park manager el consejo del mdico. Asegrese de hacerle al mdico cualquier pregunta que tenga. Document Released: 09/04/2010 Document Revised: 08/23/2014 Elsevier Interactive Patient Education  2017 Elsevier Inc.  -  Estreimiento - Adultos (Constipation, Adult) Se llama constipacin cuando:  Elimina heces (defeca) menos de 3 veces por semana.  Tiene dificultad para defecar.  Las heces son secas y duras o son ms grandes que lo normal. CUIDADOS EN EL HOGAR   Consuma alimentos con alto contenido de [place]. Por ejemplo, frutas, vegetales, porotos y cereales integrales, como el arroz integral.  Evite los alimentos ricos en grasas y International aid/development worker. Estos incluyen patatas fritas, hamburguesas, galletas, dulces y refrescos.  Si no consume suficientes alimentos ricos en fibras, tome productos que tengan agregado de fibra (suplementos).  Beba suficiente lquido para mantener el pis (orina) claro o de color amarillo plido.  Haga ejercicio en forma regular, o  como lo indique su mdico.  Vaya al bao cuando sienta la necesidad de defecar. No se aguante las ganas.  Solo tome los medicamentos que le haya indicado su mdico. No tome medicamentos que le ayuden a Advertising copywriter (laxantes) sin antes consultarlo con su  mdico. SOLICITE AYUDA DE INMEDIATO SI:   Observa sangre brillante en las heces (materia fecal).  El estreimiento dura ms de 4 das o [place].  Tiene dolor en el vientre (abdominal) o el trasero (recto).  Las heces son delgadas (como un lpiz).  Pierde peso de [place] inexplicable. ASEGRESE DE QUE:   Comprende estas instrucciones.  Controlar su afeccin.  Recibir ayuda de inmediato si no mejora o si empeora. Esta informacin no tiene Theme park manager el consejo del mdico. Asegrese de hacerle al mdico cualquier pregunta que tenga. Document Released: 09/04/2010 Document Revised: 08/23/2014 Elsevier Interactive Patient Education  2017 [employer].  -  Plan de alimentacin cardiosaludable (Heart-Healthy Eating Plan) Muchos factores influyen en la salud cardaca, entre ellos, los hbitos de alimentacin y de ejercicio fsico. El riesgo cardaco (coronario) aumenta con los niveles anormales de grasa (lpidos) en la [place]. La planificacin de las comidas cardiosaludables incluye limitar las grasas poco saludables, aumentar las grasas saludables y Radio producer otros cambios pequeos en la dieta, por ejemplo, mantener un peso corporal saludable para ayudar a que los niveles de lpidos se mantengan dentro de un rango normal. EN QU CONSISTE EL PLAN? El mdico le recomienda que:  No consuma ms del __________% de las caloras totales en su ingesta diaria de grasa.  Limite su ingesta de grasas saturadas a menos del ______% de las caloras totales diarias.  Limite la cantidad de colesterol en su dieta a menos de _________mg Karie Chimera. QU TIPOS DE GRASAS DEBO ELEGIR?  Elija grasas saludables con mayor frecuencia. Elija las grasas monoinsaturadas y [address], como el aceite de oliva y canola, las semillas de [place], las nueces, las almendras y las semillas.  Consuma ms grasas omega-3. Las mejores opciones incluyen salmn, caballa, sardinas, atn, aceite de lino y semillas de  lino molidas. Propngase comer pescado al [employer] veces por semana.  Limite el consumo de grasas saturadas. Estas se encuentran principalmente en los productos de origen animal, como las carnes, la [place] y la crema. Las grasas saturadas de origen vegetal incluyen aceite de palma, de palmiste y de coco.  Evite los alimentos con aceites parcialmente hidrogenados. Estos contienen grasas trans. Entre los ejemplos de alimentos con grasas trans se incluyen margarinas en barra, algunas margarinas untables, galletas dulces o saladas y otros productos horneados. QU PAUTAS GENERALES DEBO SEGUIR?  Lea las etiquetas de los alimentos detenidamente para identificar los que contienen grasas trans o altas cantidades de grasas saturadas.  Llene la mitad del plato con verduras y ensaladas de hojas verdes. Coma 4 o 5porciones de verduras Air cabin crew. Una porcin de verduras equivale a 1taza de verduras de hoja crudas, taza de verduras en trozos crudas o cocidas, o taza de jugo de verduras.  Llene un cuarto del plato con cereales integrales. Busque la palabra "integral" en Estate agent de la lista de ingredientes.  Llene un cuarto del plato con alimentos con protenas magras.  Coma 4 o 5porciones de frutas por da. Una porcin de frutas equivale a una fruta mediana entera; taza de fruta disecada; taza de frutas frescas, congeladas o enlatadas, o taza de jugo 100% de fruta.  Consuma ms alimentos con fibra soluble, por ejemplo, manzanas, brcoli, zanahorias, frijoles, guisantes  y [place]. Trate de consumir de 20a 30g de [employer].  Consuma ms comida casera y menos de restaurante, de buf y comida rpida.  Limite o evite el alcohol.  Limite los alimentos con alto contenido de almidn y International aid/development worker.  Evite las comidas fritas.  Cocine los alimentos utilizando mtodos que no sean la fritura. Las opciones de coccin ms [place] son Development worker, community, Regulatory affairs officer, Software engineer y asar a Patent attorney. Otras  sugerencias para reducir las grasas incluyen lo siguiente:  Quite la piel de las aves.  Quite todas las grasas visibles de las carnes.  Espume la grasa de los guisos, las sopas y las salsas antes de servirlos.  Cocine al vapor las verduras en agua o caldo.  Baje de peso si es necesario. Perder solo del 5 al 10% de su peso inicial puede ayudarle a mejorar su estado de salud general y a Education officer, museum, como la diabetes y las enfermedades cardacas.  Aumente el consumo de frutos secos, legumbres y semillas a 4 o 5porciones por semana. Una porcin de frijoles o legumbres secos equivale a taza despus de su coccin, una porcin de frutos secos equivale a 1onzas y [place] porcin de semillas equivale a onza o 1cucharada.  Es posible que deba controlar la ingesta de sal (sodio), especialmente si tiene hipertensin arterial. Hable con el mdico o el nutricionista para obtener ms informacin sobre cmo Recruitment consultant. QU ALIMENTOS PUEDO COMER?  Cereales  Panes, incluido el pan francs, blanco, pita, de [place], de pasas de [place], de centeno, de avena e [place]. Tortillas que no estn fritas ni elaboradas con manteca de cerdo ni grasas trans. Panecillos bajos en grasas, incluidos los panes para perros calientes y [place], y los bollitos tipo ingls. Galletas. Muffins. Waffles. Panqueques. Palomitas de maz con bajo contenido calrico. Cereales integrales. Pan sin levadura. Tostada [place]ba. Pretzels. Palitos de pan. Galletas. Galletitas y Product manager en grasas, entre ellas, las que tienen forma de [place], las Omega, el pan cimo, las [place], las que tienen forma de [place] y las de centeno. Arroz y pastas, incluido el arroz integral y las pastas elaboradas con cereales integrales. Verduras  Todas las verduras. Frutas  Todas las frutas, pero limite el [place]. Valley y [place] fuentes de protenas  Carne de res, [place], cerdo y cordero magras sin grasa. Pollo y pavo sin piel.  Todos los pescados y Liberty Global. Pato salvaje, conejo, faisn y venado. Claras de huevo o sustitutos del huevo bajos en colesterol. Porotos, guisantes, lentejas secos y tofu.Semillas y la mayora de los frutos secos. Lcteos  Quesos descremados y semidescremados, entre ellos, ricota, queso en hebras y Garment/textile technologist. Leche descremada o al 1% que sea lquida, en polvo o evaporada. Suero de [employer] Resources elaborado con [employer]. Yogur descremado o bajo en grasas. Bebidas  [place] mineral. Bebidas gaseosas dietticas. Dulces y postres  Sorbetes y helados de fruta. [place], [place], [place], jalea y [place]. Merengues y gelatinas. Caramelos de [employer] Applications International, como caramelos duros, caramelos de goma, pastillas de goma, mentas, malvaviscos y pequeas cantidades de chocolate amargo. Torta ngel. Coma todos los dulces y postres con moderacin. Grasas y Financial risk analyst no hidrogenadas (sin grasas trans). Aceites vegetales, incluido el de soja, ssamo, girasol, [place], man, crtamo, maz, canola y semillas de algodn. Alios para ensalada o mayonesa elaborados con aceite vegetal. Limite las grasas y los aceites agregados que [place] para Water quality scientist, Development worker, community, preparar ensaladas y las cremas untables. Otros  [place] en polvo. T o caf. [employer]M Corporation y  condimentos. Los artculos mencionados arriba pueden no ser Raytheon de las bebidas o los alimentos recomendados. Comunquese con el nutricionista para conocer ms opciones.  QU ALIMENTOS NO SE RECOMIENDAN? Cereales  Panes elaborados con grasas saturadas o trans, aceites o Eastman Kodak. Croissants. Panecillos de mantequilla. Panes de queso. Panecillos dulces. Rosquillas. Palomitas de maz con mantequilla. Fideos chow mein. Galletitas con FedEx de grasas, como las que contienen queso o [place]. Carnes y [address] fuentes de protenas  Carnes grasas, como perros calientes, [place] de res, [address], puntas de [place], [place], asado de [place] o  [place], y carnero. Fiambres altos en grasas, como salame y [place]. Caviar. Pato y ganso domsticos. Vsceras, como riones, hgado, [place], sesos, [place] de ave, chinchulines y Programmer, multimedia. Lcteos  Crema, crema agria, queso crema y [place] cottage con crema. Quesos elaborados con Eastman Kodak, incluido el queso [place] (bleu), [place], [place], [place], [place], [place], suizo, cheddar, camembert y Iyanbito. Leche entera o al 2% que sea [place], evaporada o condensada. Suero de Liberty Global. Salsa de crema o queso alta en grasas. Yogur elaborado con Eastman Kodak. Bebidas  Refrescos regulares y bebidas con agregado de azcar. Dulces y Fiserv. Pudin. Galletas. Tortas que no sean la torta ngel. Caramelos que contengan chocolate con leche o chocolate blanco, grasa hidrogenada, mantequilla, coco o ingredientes desconocidos. Almbares con mantequilla. Helados o bebidas elaboradas con helado con alto contenido de grasas. Grasas y [address] que [place], [place] (the territory South of 60 deg S) de carne o materia grasa. Manteca de cacao, aceites hidrogenados, aceite de palma, aceite de coco, aceite de palmiste. A menudo, estos se encuentran en los productos horneados, los caramelos, las comidas fritas, las cremas no lcteas y las coberturas batidas. Grasas y [address] grasas slidas, incluida la grasa del tocino, el cerdo [place], la [place] de cerdo y Civil engineer, contracting. Sustitutos de crema no lctea, como cremas para caf y sustitutos de crema agria. Alios para ensaladas elaborados con aceites desconocidos, queso o crema agria. Los artculos mencionados arriba pueden no ser Raytheon de las bebidas y los alimentos que se Theatre stage manager. Comunquese con el nutricionista para obtener ms informacin.  Esta informacin no tiene Theme park manager el consejo del mdico. Asegrese de hacerle al mdico cualquier pregunta que tenga. Document Released: 05/30/2009 Document Revised: 08/23/2014 Document Reviewed:  01/24/2014 Elsevier Interactive Patient Education  2017 Elsevier Inc.  -  Dolor abdominal en los adultos (Abdominal Pain, Adult) El dolor abdominal puede tener muchas causas. A menudo, no es grave y [place] sin tratamiento o con tratamiento en la casa. Sin embargo, a Facilities manager abdominal es grave. El mdico revisar sus antecedentes mdicos y le har un examen fsico para tratar de Production assistant, radio causa del dolor abdominal. INSTRUCCIONES PARA EL CUIDADO EN EL HOGAR  Tome los medicamentos de venta libre y los recetados solamente como se lo haya indicado el mdico. No tome un laxante a menos que se lo haya indicado el mdico.  Beba suficiente lquido para Pharmacologist la orina clara o de color amarillo plido.  Controle su afeccin para ver si hay cambios.  Concurra a todas las visitas de control como se lo haya indicado el mdico. Esto es importante. SOLICITE ATENCIN MDICA SI:  El dolor abdominal cambia o empeora.  No tiene apetito o baja de peso sin proponrselo.  Est estreido o tiene diarrea durante ms de 2 o 3das.  Tiene dolor cuando orina o defeca.  El dolor abdominal lo despierta de noche.  El Product/process development scientist  con las comidas, despus de comer o con determinados alimentos.  Tiene vmitos y no puede retener nada.  Tiene fiebre. SOLICITE ATENCIN MDICA DE INMEDIATO SI:  El dolor no desaparece tan pronto como el mdico le dijo que era esperable.  No puede detener los vmitos.  El Engineer, miningdolor se siente solo en zonas del abdomen, como el lado derecho o la parte inferior izquierda del abdomen.  Las heces son sanguinolentas o de color negro, o de aspecto alquitranado.  Tiene dolor intenso, clicos, o meteorismo en el abdomen.  Tiene signos de deshidratacin, por ejemplo:  Larose Kellsrina oscura, muy escasa o falta de [place].  Labios agrietados.  M.D.C. HoldingsBoca seca.  Ojos hundidos.  Somnolencia.  Debilidad. Esta informacin no tiene Theme park managercomo fin reemplazar el consejo del mdico. Asegrese  de hacerle al mdico cualquier pregunta que tenga. Document Released: 08/02/2005 Document Revised: 08/23/2014 Document Reviewed: 01/14/2016 Elsevier Interactive Patient Education  2017 Arvin[employer]itor[employer]vier Inc.

## 2016-09-27 NOTE — Progress Notes (Signed)
Courtney Edwards, is a 41 y.o. female  [id number]  [id number]  DOB - 01/19/1976  Chief Complaint  Patient presents with  . Annual Exam  . Abdominal Pain        Subjective:   Courtney Edwards is a 41 y.o. female here today for a follow up visit for abd /pelvic pains, hx of hld, hypothyroidism, obesity. Recently seen 08/23/16 for same problems, here for f/u. Per pt, pains persist, esp in meg region, and bilat pelvic regions.  No relationship to foods/fatty/spicy, etc, no correlation to bms/menses. She woke up w/ severe bilat pelvic pains few wks  Ago, resolved on own. Denies n/v/f/c/weight loss/gain/bloating/diarrhea. Does note constipation at times, but per pt, abd pains do not improved after defecation. She notes pains ongoing x 3 years now, sometimes occurs qod/every other day,  Last 20mins to 1 hr at times. May occasionally occur when doing housework, etc.  Does not smoke or drink etoh.  Patient has No headache, No chest pain, No Nausea, No new weakness tingling or numbness, No Cough - SOB.  Interpreter was used to communicate directly with patient for the entire encounter including providing detailed patient instructions.   No problems updated.  ALLERGIES: No Known Allergies  PAST MEDICAL HISTORY: Past Medical History:  Diagnosis Date  . Medical history non-contributory   . Mental disorder   . Post partum depression 2002    MEDICATIONS AT HOME: Prior to Admission medications   Medication Sig Start Date End Date Taking? Authorizing Provider  atorvastatin (LIPITOR) 20 MG tablet Take 1 tablet (20 mg total) by mouth daily at 6 PM. 08/24/16   Pete Glatterawn T Giulliana Mcroberts, MD  calcium-vitamin D 250-100 MG-UNIT per tablet Take 1 tablet by mouth 2 (two) times daily.    Historical Provider, MD  hydrocortisone (ANUSOL-HC) 25 MG suppository Place 1 suppository (25 mg total) rectally 2 (two) times daily. 08/23/16   Pete Glatterawn T Meko Masterson, MD  levothyroxine (SYNTHROID) 25 MCG tablet Take 1 tablet (25 mcg  total) by mouth daily before breakfast. 08/24/16   Pete Glatterawn T Korine Winton, MD  pantoprazole (PROTONIX) 40 MG tablet Take 1 tablet (40 mg total) by mouth daily. 09/27/16   Pete Glatterawn T Jalyn Rosero, MD  polyethylene glycol powder (GLYCOLAX/MIRALAX) powder Take 17 g by mouth 2 (two) times daily as needed. 09/27/16   Pete Glatterawn T Justyn Boyson, MD  pramoxine-hydrocortisone (PROCTOCREAM-HC) 1-1 % rectal cream Place 1 application rectally 2 (two) times daily. Patient not taking: Reported on 08/23/2016 02/11/15   Ambrose FinlandValerie A Keck, NP  senna-docusate (SENOKOT-S) 8.6-50 MG tablet Take 1 tablet by mouth daily. 08/23/16   Pete Glatterawn T Jinan Biggins, MD  terbinafine (LAMISIL AT) 1 % cream Apply 1 application topically 2 (two) times daily. 08/23/16   Pete Glatterawn T Abdulahi Schor, MD  terbinafine (LAMISIL) 250 MG tablet Take 1 tablet (250 mg total) by mouth daily. Patient not taking: Reported on 08/23/2016 01/27/15   Ambrose FinlandValerie A Keck, NP     Objective:   Vitals:   09/27/16 1049  BP: 104/70  Pulse: 63  Resp: 16  Temp: 98.4 F (36.9 C)  TempSrc: Oral  SpO2: 99%  Weight: 172 lb 3.2 oz (78.1 kg)    Exam General appearance : Awake, alert, not in any distress. Speech Clear. Not toxic looking, obese, pleasant. HEENT: Atraumatic and Normocephalic, pupils equally reactive to light. Neck: supple, no JVD.  Chest:Good air entry bilaterally, no added sounds. CVS: S1 S2 regular, no murmurs/gallups or rubs. Abdomen: Bowel sounds active, mild ttp meg, neg Murphy's sign. Very ttp  bilat pelvic regions on deep palpation, but not palpable masses. No g/r, + obese abd. Extremities: B/L Lower Ext shows no edema, both legs are warm to touch Neurology: Awake alert, and oriented X 3, CN II-XII grossly intact, Non focal Skin:No Rash  Data Review Lab Results  Component Value Date   HGBA1C 5.50 01/27/2015    Depression screen [facility] 2/9 08/23/2016 01/27/2015 04/01/2014 03/07/2014  Decreased Interest (No Data) 0 0 0  Down, Depressed, Hopeless (No Data) 0 0 0  PHQ - 2 Score - 0 0 0    Altered sleeping 3 - - -  Tired, decreased energy 3 - - -  Change in appetite 0 - - -  Feeling bad or failure about yourself  (No Data) - - -  Trouble concentrating 3 - - -  Moving slowly or fidgety/restless 3 - - -  Suicidal thoughts 0 - - -      Assessment & Plan   1. Epigastric pain ?gerd related, pud?  Ibs? Vs constipation? - trial ppi. - H. pylori breath test - - US Abdomen Complete; Future - eval gb - DG Abd 1 View; Future  2. Pelvic pain, bilat - recent pelvic us/vag US showed slightly thick endometrium, ?endometriosis? As cause of pain vs constipation/obstipation - increase fiber in diet, recd daily bms, no straining recd, prn stool softeners, added miralax as well prn. - Ambulatory referral to Obstetrics / Gynecology - DG Abd 1 View; Future  3. Constipation, unspecified constipation type See #2  4. Abdominal pain, unspecified abdominal location See #1 and 2  5. Hypothyroidism, unspecified type Take synthroid.  6. Hyperlipidemia, unspecified hyperlipidemia type Take atorvastatin  7. Reviewed recent labs with patient via interpreter w/ recommendations. - take otc vit d 5,000iu daily for mild vit d def.     Patient have been counseled extensively about nutrition and exercise  Return in about 3 months (around 12/25/2016), or if symptoms worsen or fail to improve.  The patient was given clear instructions to go to ER or return to medical center if symptoms don't improve, worsen or new problems develop. The patient verbalized understanding. The patient was told to call to get lab results if they haven't heard anything in the next week.   This note has been created with Education officer, environmental. Any transcriptional errors are unintentional.   Pete Glatter, MD, MBA/MHA [facility] [place], [place] [phone]   09/27/2016, 11:41 AM

## 2016-09-28 LAB — H. PYLORI BREATH TEST: H. PYLORI BREATH TEST: DETECTED — AB

## 2016-09-29 ENCOUNTER — Other Ambulatory Visit: Payer: Self-pay | Admitting: Internal Medicine

## 2016-09-29 MED ORDER — AMOXICILLIN 500 MG PO CAPS: 1000.0000 mg | ORAL_CAPSULE | Freq: Two times a day (BID) | ORAL | 0 refills | Status: DC

## 2016-09-29 MED ORDER — CLARITHROMYCIN 500 MG PO TABS: 500.0000 mg | ORAL_TABLET | Freq: Two times a day (BID) | ORAL | 0 refills | Status: DC

## 2016-10-04 ENCOUNTER — Ambulatory Visit (HOSPITAL_COMMUNITY)
Admission: RE | Admit: 2016-10-04 | Discharge: 2016-10-04 | Disposition: A | Discharge status: Home / Self Care | Payer: Self-pay | Source: Ambulatory Visit | Attending: Internal Medicine | Admitting: Internal Medicine

## 2016-10-04 DIAGNOSIS — R1013 Epigastric pain: Secondary | ICD-10-CM

## 2016-10-04 DIAGNOSIS — R102 Pelvic and perineal pain: Secondary | ICD-10-CM

## 2016-10-05 ENCOUNTER — Telehealth: Payer: Self-pay

## 2016-10-05 ENCOUNTER — Other Ambulatory Visit: Payer: Self-pay | Admitting: Internal Medicine

## 2016-10-05 MED ORDER — PANTOPRAZOLE SODIUM 40 MG PO TBEC: 40.0000 mg | DELAYED_RELEASE_TABLET | Freq: Two times a day (BID) | ORAL | 0 refills | Status: DC

## 2016-10-05 NOTE — Telephone Encounter (Signed)
CMA call to go over abdominal x ray and ultrasound   Patient Verify DOB  Patient was aware and understood

## 2016-10-05 NOTE — Telephone Encounter (Signed)
-----   Message from Pete Glatterawn T Langeland, MD sent at 10/05/2016 11:20 AM EST ----- Her abdominal xray and ultrasound of stomach /abdomin actually pretty normal. No acute findings of kidney stones, gall bladder disease. I suspect all her abd pains due to peptic ulcer disease from the hpylori.  She needs to pick up both antibiotics (clarithromycin and amoxicillin ) if she has not already done so, and start taking with her protonix which I changed to 40mg  po bid x 14 days. Once done w/ all meds, can check to see if hpylori fully treated, if not may need another round of antibiotics/protonix. thanks

## 2016-10-06 ENCOUNTER — Ambulatory Visit (HOSPITAL_COMMUNITY): Payer: Self-pay

## 2016-10-22 ENCOUNTER — Telehealth: Payer: Self-pay | Admitting: Internal Medicine

## 2016-10-22 NOTE — Telephone Encounter (Signed)
Pt called stating that she is seeing blood when wipes after urinating and would like to know if this is a normal side effect of the medication she was given. Advised pt that it usually takes 24-48 hours to get a response, pt understood. Please f/u.

## 2016-11-01 NOTE — Telephone Encounter (Signed)
Please have her come in and see someone. I do not know why she would be having blood in her urine, unless she has a uti. We started her on abx last month for hpylori. thanks

## 2016-11-01 NOTE — Telephone Encounter (Signed)
Will forward to pcp

## 2016-11-02 NOTE — Telephone Encounter (Signed)
Could you schedule her an appointment with walk-in if she is available

## 2016-11-10 ENCOUNTER — Encounter: Payer: Self-pay | Admitting: Obstetrics and Gynecology

## 2016-12-07 ENCOUNTER — Ambulatory Visit (INDEPENDENT_AMBULATORY_CARE_PROVIDER_SITE_OTHER): Payer: Self-pay | Admitting: Obstetrics & Gynecology

## 2016-12-07 VITALS — BP 105/65 | HR 63 | Wt 165.0 lb

## 2016-12-07 DIAGNOSIS — N761 Subacute and chronic vaginitis: Secondary | ICD-10-CM

## 2016-12-07 LAB — POCT URINALYSIS DIP (DEVICE)
Bilirubin Urine: NEGATIVE
Glucose, UA: NEGATIVE mg/dL
HGB URINE DIPSTICK: NEGATIVE
Ketones, ur: NEGATIVE mg/dL
LEUKOCYTES UA: NEGATIVE
NITRITE: NEGATIVE
PH: 5 (ref 5.0–8.0)
Protein, ur: NEGATIVE mg/dL
Specific Gravity, Urine: >=1.03 (ref 1.005–1.030)
UROBILINOGEN UA: 0.2 mg/dL (ref 0.0–1.0)

## 2016-12-07 MED ORDER — CLOTRIMAZOLE 1 % VA CREA: 1.0000 | TOPICAL_CREAM | Freq: Every day | VAGINAL | 0 refills | Status: DC

## 2016-12-07 NOTE — Patient Instructions (Signed)
Candidiasis vaginal en los adultos (Gastrointestinal Yeast Infection, Adult) La candidiasis vaginal es una afeccin que causa dolor, hinchazn y enrojecimiento (inflamacin) de la vagina. Tambin causa secrecin vaginal. Esta es una enfermedad frecuente. Algunas mujeres contraen esta infeccin con frecuencia. CAUSAS La causa de la infeccin es un cambio en el equilibrio normal de los hongos (cndida) y las bacterias que viven en la vagina. Esta alteracin deriva en el crecimiento excesivo de los hongos, lo que causa la inflamacin. FACTORES DE RIESGO Es ms probable que esta afeccin se manifieste en:  Las mujeres que toman antibiticos.  Las mujeres que tienen diabetes.  Las mujeres que toman anticonceptivos.  Las mujeres que estn embarazadas.  Las mujeres que se hacen duchas vaginales con frecuencia.  Las mujeres que tienen un sistema de defensa (inmunitario) dbil.  Las mujeres que han tomado corticoides durante mucho tiempo.  Las mujeres que usan ropa ajustada con frecuencia. SNTOMAS Los sntomas de esta afeccin incluyen lo siguiente:  Secrecin vaginal blanca y espesa.  Hinchazn, picazn, enrojecimiento e irritacin de la vagina. Los labios de la vagina (vulva) tambin se pueden infectar.  Dolor o ardor al orinar.  Dolor durante las relaciones sexuales. DIAGNSTICO Esta afeccin se diagnostica mediante la historia clnica y un examen fsico. Este incluye un examen plvico. El mdico examinar una muestra de la secrecin vaginal con un microscopio. Probablemente el mdico enve esta muestra al laboratorio para analizarla y confirmar el diagnstico. TRATAMIENTO Esta afeccin se trata con medicamentos. Los medicamentos pueden ser recetados o de venta libre. Podrn indicarle que use uno o ms de lo siguiente:  Medicamentos por va oral.  Medicamentos que se aplican como una crema.  Medicamentos que se colocan directamente en la vagina (vulos vaginales). INSTRUCCIONES  PARA EL CUIDADO EN EL HOGAR  Tome o aplquese los medicamentos de venta libre y recetados solamente como se lo haya indicado el mdico.  No tenga relaciones sexuales hasta que el mdico lo autorice. Comunique a su compaero sexual que tiene una infeccin por hongos. Esas personas deben consultar al mdico si tienen sntomas.  No use ropa ajustada, como pantis o pantalones ajustados.  Evite el uso de tampones hasta que el mdico lo autorice.  Consuma ms yogur. Esto puede ayudar a evitar la recurrencia de la candidiasis.  Intente darse un bao de asiento para aliviar las molestias. Se trata de un bao de agua tibia que se toma mientras se est sentado. El agua solo debe llegar hasta las caderas y cubrir las nalgas. Hgalo 3o 4veces al da o como se lo haya indicado el mdico.  No se haga duchas vaginales.  Use ropa interior transpirable de algodn.  Si tiene diabetes, mantenga bajo control los niveles de azcar en la sangre. SOLICITE ATENCIN MDICA SI:  Tiene fiebre.  Los sntomas desaparecen y luego reaparecen.  Los sntomas no mejoran con el tratamiento.  Los sntomas empeoran.  Aparecen nuevos sntomas.  Aparecen ampollas alrededor o adentro de la vagina.  Le sale sangre de la vagina y no est menstruando.  Siente dolor en el abdomen. Esta informacin no tiene como fin reemplazar el consejo del mdico. Asegrese de hacerle al mdico cualquier pregunta que tenga. Document Released: 05/12/2005 Document Revised: 11/24/2015 Document Reviewed: 02/03/2015 Elsevier Interactive Patient Education  2017 Elsevier Inc.   

## 2016-12-07 NOTE — Progress Notes (Signed)
History:  41 y.o. [id number] here today for eval of pain in her 'private parts' Pt is s/p SVD x5.  With her last delivery she had a difficult delivery of a 10#11ox baby. 0n ~1 Pt reports years of pain that became much worse on Sunday.  LMP due today.  Last Mar 23rd.  Pt does not reports pain with her cycle in the past.  Pt reports that these sx occur wne she is walking. She feels like something is is coming out.    Pt reports that she recently took flagyl for BV and that's wen her sx became worse. Mainly itching  The following portions of the patient's history were reviewed and updated as appropriate: allergies, current medications, past family history, past medical history, past social history, past surgical history and problem list.  Review of Systems:  Pertinent items are noted in HPI.   Objective:  Physical Exam Blood pressure 105/65, pulse 63, weight 165 lb (74.8 kg), currently breastfeeding. Gen: NAD Abd: Soft, nontender and nondistended Pelvic: Normal appearing external genitalia with the exception of some vulvar edema.- no lesions noted. normal appearing vaginal mucosa and cervix.  Normal discharge.  Small uterus, no other palpable masses, no uterine or adnexal tenderness  Labs and Imaging No results found.  Assessment & Plan:  Yeast vaginitis after flagyl  Clotrimazole x 4- 7 days  f/u prn  Exam conducted with the assistance of a live interpreter.  Total face-to-face time with patient was 20 min.  Greater than 50% was spent in counseling and coordination of care with the patient.   Loralai Eisman L. Harraway-Smith, M.D., [place]

## 2016-12-27 ENCOUNTER — Ambulatory Visit: Payer: Self-pay | Attending: Internal Medicine

## 2016-12-30 ENCOUNTER — Encounter: Payer: Self-pay | Admitting: Internal Medicine

## 2016-12-31 ENCOUNTER — Encounter: Payer: Self-pay | Admitting: Internal Medicine

## 2017-01-03 ENCOUNTER — Encounter: Payer: Self-pay | Admitting: Internal Medicine

## 2017-01-03 ENCOUNTER — Ambulatory Visit (INDEPENDENT_AMBULATORY_CARE_PROVIDER_SITE_OTHER): Payer: Self-pay | Admitting: Obstetrics & Gynecology

## 2017-01-03 ENCOUNTER — Encounter: Payer: Self-pay | Admitting: Obstetrics & Gynecology

## 2017-01-03 VITALS — BP 103/68 | HR 63 | Wt 176.0 lb

## 2017-01-03 DIAGNOSIS — N393 Stress incontinence (female) (male): Secondary | ICD-10-CM

## 2017-01-03 DIAGNOSIS — N941 Unspecified dyspareunia: Secondary | ICD-10-CM

## 2017-01-03 NOTE — Progress Notes (Signed)
   Subjective:    Patient ID: Courtney CosierMiriam Leticia Constancia Edwards, female    DOB: 08/08/1976, 41 y.o.   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 She denies urge incontinence. She also reports dyspareunia for 4 years, worse in the last few months. She occasionally voids with sex.  She also complains of increasing girth of her abdomen. She feels lots of pain, feels like something is falling out.    Review of Systems Periods monthly, lasting 8 days    Objective:   Physical Exam  WNWHHFNAD Breathing, conversing, and ambulating normally Abd- obese, benign Bimanual- ULN size, RV, mobile, good pubic arch for vaginal surgery + Q tip test      Assessment & Plan:  Increasing girth- she has had several u/s in the last year, all normal GUIS- I think she would benefit from a midurethral sling I have discussed the 5% risk of urge incontinence. Dyspareunia- offered dx l/s  At this time, she declines the sling and would like the dx l/s

## 2017-01-03 NOTE — Progress Notes (Signed)
Pt states she was referred to have a vaginal exam. She is also having urinary leaking when running. Only during activities. Up to date on papsmear, last done 03/2014 plans to have it done with her PCP in August.

## 2017-01-06 ENCOUNTER — Encounter (HOSPITAL_COMMUNITY): Payer: Self-pay

## 2017-02-02 ENCOUNTER — Ambulatory Visit: Payer: Self-pay | Attending: Internal Medicine

## 2017-02-04 NOTE — Patient Instructions (Addendum)
Instrucciones:  Su cirugia esta programada para-( your procedure is scheduled on) : Wednesday, February 09, 2017   Entre por la entrada principal a la(s) -(enter through the main entrance at):  11:00 AM  [address],  [place] el [phone] e informenos de su llegada ( pick up phone, dial [phone] on arrival)  Por favor llame al [phone] si tiene algun problema la Lily Kochermanana de cirugia ( please call [phone] if you have any problems the morning of surgery.)  Recuerde: (Remember)  No coma alimentos ni tome liquidos, incluyendo agua, despues de la medianoche del  ( Do not eat food or drink liquids including water after midnight on After Midnight Tuesday  Tome estas medicinas la manana de la cirugia con un sorbito de agua (take these meds the morning of surgery with a SIP of water) None  Puede cepillarse los dientes en la manana de la [place]. (you may brush your teeth the morning of surgery)  NO use joyas, maquillaje de ojos, lapiz labial, crema para el cuerpo o esmalte de unas oscuro - las unas de los pies pueden estar pintados. ( Do not wear jewelry, eye makeup, lipstick, body lotion, or dark fingernail polish)  Puede usar desodorante ( you may wear deodorant)  Si va a ser ingresado despues de las [place], deje la [place] en el carro hasta que se le haya asignado una habitacion. ( If you are to be admitted after surgery, leave suitcase in car until your room has been assigned.)  A los pacientes que se les de de alta el mismo dia no se les permitira manejar a casa.  ( Patients discharged on the day of surgery will not be allowed to drive home)  Use ropa suelta y comoda de regreso a Technical sales engineercaso. ( wear loose comfortable clothes for ride home)  Firma del paciente (patient signature) ______________________________________   Your procedure is scheduled on:  Wednesday, February 09, 2017  [facility] through the [facility] at:  11:00 AM  Pick up the phone at the desk and dial  [phone].  Call this number if you have problems the morning of surgery: [phone].  Remember: Do NOT eat food:  After Midnight Tuesday  Do NOT drink clear liquids after:  6:30 AM day of surgery  Take these medicines the morning of surgery with a SIP OF WATER:  None  Stop ALL herbal medications at this time  Do NOT smoke the day of surgery.  Do NOT wear jewelry (body piercing), metal hair clips/bobby pins, make-up, artifical eyelashes or nail polish. Do NOT wear lotions, powders, or perfumes.  You may wear deodorant. Do NOT shave for 48 hours prior to surgery. Do NOT bring valuables to the hospital. Contacts, dentures, or bridgework may not be worn into surgery.  Have a responsible adult drive you home and stay with you for 24 hours after your procedure  Bring a copy of your healthcare power of attorney and living will documents.

## 2017-02-07 ENCOUNTER — Encounter (HOSPITAL_COMMUNITY): Payer: Self-pay

## 2017-02-07 ENCOUNTER — Encounter (HOSPITAL_COMMUNITY)
Admission: RE | Admit: 2017-02-07 | Discharge: 2017-02-07 | Disposition: A | Discharge status: Home / Self Care | Payer: Self-pay | Source: Ambulatory Visit | Attending: Obstetrics & Gynecology | Admitting: Obstetrics & Gynecology

## 2017-02-07 DIAGNOSIS — K649 Unspecified hemorrhoids: Secondary | ICD-10-CM | POA: Insufficient documentation

## 2017-02-07 DIAGNOSIS — K219 Gastro-esophageal reflux disease without esophagitis: Secondary | ICD-10-CM | POA: Insufficient documentation

## 2017-02-07 DIAGNOSIS — Z01812 Encounter for preprocedural laboratory examination: Secondary | ICD-10-CM | POA: Insufficient documentation

## 2017-02-07 DIAGNOSIS — B351 Tinea unguium: Secondary | ICD-10-CM | POA: Insufficient documentation

## 2017-02-07 DIAGNOSIS — K5900 Constipation, unspecified: Secondary | ICD-10-CM | POA: Insufficient documentation

## 2017-02-07 DIAGNOSIS — R7989 Other specified abnormal findings of blood chemistry: Secondary | ICD-10-CM | POA: Insufficient documentation

## 2017-02-07 DIAGNOSIS — E0590 Thyrotoxicosis, unspecified without thyrotoxic crisis or storm: Secondary | ICD-10-CM | POA: Insufficient documentation

## 2017-02-07 DIAGNOSIS — E785 Hyperlipidemia, unspecified: Secondary | ICD-10-CM | POA: Insufficient documentation

## 2017-02-07 HISTORY — DX: Hypotension, unspecified: I95.9

## 2017-02-07 LAB — CBC
HEMATOCRIT: 38 % (ref 36.0–46.0)
Hemoglobin: 12.6 g/dL (ref 12.0–15.0)
MCH: 29.4 pg (ref 26.0–34.0)
MCHC: 33.2 g/dL (ref 30.0–36.0)
MCV: 88.8 fL (ref 78.0–100.0)
PLATELETS: 363 10*3/uL (ref 150–400)
RBC: 4.28 MIL/uL (ref 3.87–5.11)
RDW: 14 % (ref 11.5–15.5)
WBC: 8.1 10*3/uL (ref 4.0–10.5)

## 2017-02-07 NOTE — Pre-Procedure Instructions (Signed)
Used video remote interpreter ID # [id number] for pre op appointment.

## 2017-02-09 ENCOUNTER — Encounter (HOSPITAL_COMMUNITY): Payer: Self-pay

## 2017-02-09 ENCOUNTER — Ambulatory Visit (HOSPITAL_COMMUNITY)
Admission: RE | Admit: 2017-02-09 | Discharge: 2017-02-09 | Disposition: A | Discharge status: Home / Self Care | Payer: Self-pay | Source: Ambulatory Visit | Attending: Obstetrics & Gynecology | Admitting: Obstetrics & Gynecology

## 2017-02-09 ENCOUNTER — Ambulatory Visit (HOSPITAL_COMMUNITY): Payer: Self-pay | Admitting: Anesthesiology

## 2017-02-09 ENCOUNTER — Encounter (HOSPITAL_COMMUNITY)
Admission: RE | Disposition: A | Discharge status: Home / Self Care | Payer: Self-pay | Source: Ambulatory Visit | Attending: Obstetrics & Gynecology

## 2017-02-09 DIAGNOSIS — Z8249 Family history of ischemic heart disease and other diseases of the circulatory system: Secondary | ICD-10-CM | POA: Insufficient documentation

## 2017-02-09 DIAGNOSIS — R102 Pelvic and perineal pain: Secondary | ICD-10-CM

## 2017-02-09 DIAGNOSIS — Z833 Family history of diabetes mellitus: Secondary | ICD-10-CM | POA: Insufficient documentation

## 2017-02-09 DIAGNOSIS — K219 Gastro-esophageal reflux disease without esophagitis: Secondary | ICD-10-CM | POA: Insufficient documentation

## 2017-02-09 DIAGNOSIS — N941 Unspecified dyspareunia: Secondary | ICD-10-CM | POA: Insufficient documentation

## 2017-02-09 DIAGNOSIS — E0590 Thyrotoxicosis, unspecified without thyrotoxic crisis or storm: Secondary | ICD-10-CM | POA: Insufficient documentation

## 2017-02-09 DIAGNOSIS — Z79899 Other long term (current) drug therapy: Secondary | ICD-10-CM | POA: Insufficient documentation

## 2017-02-09 HISTORY — PX: LAPAROSCOPY: SHX197

## 2017-02-09 LAB — PREGNANCY, URINE: Preg Test, Ur: NEGATIVE

## 2017-02-09 SURGERY — LAPAROSCOPY, DIAGNOSTIC
Anesthesia: General

## 2017-02-09 MED ORDER — KETOROLAC TROMETHAMINE 30 MG/ML IJ SOLN
INTRAMUSCULAR | Status: DC | PRN
  Administered 2017-02-09: 30 mg via INTRAVENOUS

## 2017-02-09 MED ORDER — PROPOFOL 10 MG/ML IV BOLUS
INTRAVENOUS | Status: DC | PRN
  Administered 2017-02-09: 180 mg via INTRAVENOUS

## 2017-02-09 MED ORDER — IBUPROFEN 600 MG PO TABS: 600.0000 mg | ORAL_TABLET | Freq: Four times a day (QID) | ORAL | 1 refills | Status: DC | PRN

## 2017-02-09 MED ORDER — ROCURONIUM BROMIDE 100 MG/10ML IV SOLN
INTRAVENOUS | Status: DC | PRN
  Administered 2017-02-09: 5 mg via INTRAVENOUS
  Administered 2017-02-09: 10 mg via INTRAVENOUS

## 2017-02-09 MED ORDER — KETOROLAC TROMETHAMINE 30 MG/ML IJ SOLN
INTRAMUSCULAR | Status: AC
  Filled 2017-02-09: qty 1

## 2017-02-09 MED ORDER — DEXAMETHASONE SODIUM PHOSPHATE 4 MG/ML IJ SOLN
INTRAMUSCULAR | Status: AC
  Filled 2017-02-09: qty 1

## 2017-02-09 MED ORDER — BUPIVACAINE HCL (PF) 0.5 % IJ SOLN
INTRAMUSCULAR | Status: AC
  Filled 2017-02-09: qty 30

## 2017-02-09 MED ORDER — PROMETHAZINE HCL 25 MG/ML IJ SOLN: 6.2500 mg | INTRAMUSCULAR | Status: DC | PRN

## 2017-02-09 MED ORDER — LIDOCAINE HCL (CARDIAC) 20 MG/ML IV SOLN
INTRAVENOUS | Status: DC | PRN
  Administered 2017-02-09: 30 mg via INTRAVENOUS
  Administered 2017-02-09: 70 mg via INTRAVENOUS

## 2017-02-09 MED ORDER — BUPIVACAINE HCL (PF) 0.5 % IJ SOLN
INTRAMUSCULAR | Status: DC | PRN
  Administered 2017-02-09: 10 mL

## 2017-02-09 MED ORDER — FLUMAZENIL 0.5 MG/5ML IV SOLN
INTRAVENOUS | Status: AC
  Filled 2017-02-09: qty 5

## 2017-02-09 MED ORDER — PROPOFOL 10 MG/ML IV BOLUS
INTRAVENOUS | Status: AC
  Filled 2017-02-09: qty 20

## 2017-02-09 MED ORDER — ONDANSETRON HCL 4 MG/2ML IJ SOLN
INTRAMUSCULAR | Status: DC | PRN
  Administered 2017-02-09: 4 mg via INTRAVENOUS

## 2017-02-09 MED ORDER — FENTANYL CITRATE (PF) 250 MCG/5ML IJ SOLN
INTRAMUSCULAR | Status: AC
  Filled 2017-02-09: qty 5

## 2017-02-09 MED ORDER — OXYCODONE HCL 5 MG/5ML PO SOLN: 5.0000 mg | Freq: Once | ORAL | Status: DC | PRN

## 2017-02-09 MED ORDER — FENTANYL CITRATE (PF) 100 MCG/2ML IJ SOLN
INTRAMUSCULAR | Status: DC | PRN
  Administered 2017-02-09 (×2): 50 ug via INTRAVENOUS

## 2017-02-09 MED ORDER — HYDROMORPHONE HCL 1 MG/ML IJ SOLN: 0.2500 mg | INTRAMUSCULAR | Status: DC | PRN

## 2017-02-09 MED ORDER — SUGAMMADEX SODIUM 200 MG/2ML IV SOLN
INTRAVENOUS | Status: DC | PRN
  Administered 2017-02-09: 163.2 mg via INTRAVENOUS

## 2017-02-09 MED ORDER — LIDOCAINE HCL (CARDIAC) 20 MG/ML IV SOLN
INTRAVENOUS | Status: AC
  Filled 2017-02-09: qty 5

## 2017-02-09 MED ORDER — MIDAZOLAM HCL 2 MG/2ML IJ SOLN
INTRAMUSCULAR | Status: DC | PRN
  Administered 2017-02-09: 1 mg via INTRAVENOUS

## 2017-02-09 MED ORDER — SCOPOLAMINE 1 MG/3DAYS TD PT72
1.0000 | MEDICATED_PATCH | Freq: Once | TRANSDERMAL | Status: DC
  Administered 2017-02-09: 1.5 mg via TRANSDERMAL

## 2017-02-09 MED ORDER — MEPERIDINE HCL 25 MG/ML IJ SOLN: 6.2500 mg | INTRAMUSCULAR | Status: DC | PRN

## 2017-02-09 MED ORDER — DEXAMETHASONE SODIUM PHOSPHATE 10 MG/ML IJ SOLN
INTRAMUSCULAR | Status: DC | PRN
  Administered 2017-02-09: 4 mg via INTRAVENOUS

## 2017-02-09 MED ORDER — ONDANSETRON HCL 4 MG/2ML IJ SOLN
INTRAMUSCULAR | Status: AC
  Filled 2017-02-09: qty 2

## 2017-02-09 MED ORDER — ROCURONIUM BROMIDE 100 MG/10ML IV SOLN
INTRAVENOUS | Status: AC
  Filled 2017-02-09: qty 1

## 2017-02-09 MED ORDER — OXYCODONE-ACETAMINOPHEN 5-325 MG PO TABS: 1.0000 | ORAL_TABLET | Freq: Four times a day (QID) | ORAL | 0 refills | Status: DC | PRN

## 2017-02-09 MED ORDER — SUGAMMADEX SODIUM 200 MG/2ML IV SOLN
INTRAVENOUS | Status: AC
  Filled 2017-02-09: qty 2

## 2017-02-09 MED ORDER — SCOPOLAMINE 1 MG/3DAYS TD PT72
MEDICATED_PATCH | TRANSDERMAL | Status: AC
  Administered 2017-02-09: 1.5 mg via TRANSDERMAL
  Filled 2017-02-09: qty 1

## 2017-02-09 MED ORDER — MIDAZOLAM HCL 2 MG/2ML IJ SOLN
INTRAMUSCULAR | Status: AC
  Filled 2017-02-09: qty 2

## 2017-02-09 MED ORDER — OXYCODONE HCL 5 MG PO TABS: 5.0000 mg | ORAL_TABLET | Freq: Once | ORAL | Status: DC | PRN

## 2017-02-09 MED ORDER — SUCCINYLCHOLINE CHLORIDE 200 MG/10ML IV SOSY
PREFILLED_SYRINGE | INTRAVENOUS | Status: DC | PRN
  Administered 2017-02-09: 140 mg via INTRAVENOUS

## 2017-02-09 MED ORDER — LACTATED RINGERS IV SOLN
INTRAVENOUS | Status: DC
  Administered 2017-02-09 (×2): via INTRAVENOUS

## 2017-02-09 MED ORDER — SUCCINYLCHOLINE CHLORIDE 200 MG/10ML IV SOSY
PREFILLED_SYRINGE | INTRAVENOUS | Status: AC
  Filled 2017-02-09: qty 10

## 2017-02-09 SURGICAL SUPPLY — 20 items
CLOTH BEACON ORANGE TIMEOUT ST (SAFETY) ×3 IMPLANT
DRSG OPSITE POSTOP 3X4 (GAUZE/BANDAGES/DRESSINGS) IMPLANT
DURAPREP 26ML APPLICATOR (WOUND CARE) ×3 IMPLANT
GLOVE BIO SURGEON STRL SZ 6.5 (GLOVE) ×4 IMPLANT
GLOVE BIO SURGEONS STRL SZ 6.5 (GLOVE) ×2
GLOVE BIOGEL PI IND STRL 7.0 (GLOVE) ×2 IMPLANT
GLOVE BIOGEL PI INDICATOR 7.0 (GLOVE) ×4
GOWN STRL REUS W/TWL LRG LVL3 (GOWN DISPOSABLE) ×6 IMPLANT
NDL SAFETY ECLIPSE 18X1.5 (NEEDLE) ×1 IMPLANT
NEEDLE HYPO 18GX1.5 SHARP (NEEDLE) ×2
NEEDLE INSUFFLATION 120MM (ENDOMECHANICALS) ×3 IMPLANT
NS IRRIG 1000ML POUR BTL (IV SOLUTION) ×3 IMPLANT
PACK LAPAROSCOPY BASIN (CUSTOM PROCEDURE TRAY) ×3 IMPLANT
PACK TRENDGUARD 450 HYBRID PRO (MISCELLANEOUS) IMPLANT
PROTECTOR NERVE ULNAR (MISCELLANEOUS) ×6 IMPLANT
SUT VICRYL 4-0 PS2 18IN ABS (SUTURE) ×3 IMPLANT
TOWEL OR 17X24 6PK STRL BLUE (TOWEL DISPOSABLE) ×6 IMPLANT
TRENDGUARD 450 HYBRID PRO PACK (MISCELLANEOUS)
TROCAR OPTI TIP 5M 100M (ENDOMECHANICALS) ×3 IMPLANT
WARMER LAPAROSCOPE (MISCELLANEOUS) ×3 IMPLANT

## 2017-02-09 NOTE — Discharge Instructions (Signed)
Laparoscopia de diagnstico, cuidados posteriores (Diagnostic Laparoscopy, Care After) Siga estas instrucciones durante las prximas semanas. Estas indicaciones le proporcionan informacin acerca de cmo deber cuidarse despus del procedimiento. El mdico tambin podr darle instrucciones ms especficas. El tratamiento ha sido planificado segn las prcticas mdicas actuales, pero en algunos casos pueden ocurrir problemas. Comunquese con el mdico si tiene algn problema o dudas despus del procedimiento. QU ESPERAR DESPUS DEL PROCEDIMIENTO Despus del procedimiento, es frecuente sentir molestias leves en la garganta y el abdomen. INSTRUCCIONES PARA EL CUIDADO EN EL HOGAR  Tome los medicamentos de venta libre y los recetados solamente como se lo haya indicado el mdico.  No conduzca durante 24horas si le administraron un sedante.  Reanude sus actividades normales como se lo haya indicado el mdico.  No tome baos de inmersin, no nade ni use el jacuzzi hasta que el mdico lo autorice. Puede ducharse.  Siga las indicaciones del mdico acerca del cuidado de la incisin. Haga lo siguiente: ? Lvese las manos con agua y jabn antes de Multimedia programmercambiar las vendas (vendaje). Use desinfectante para manos si no dispone de [place] y [place]n. ? Cambie el vendaje como se lo haya indicado el mdico. ? No retire los puntos (suturas), el QUALCOMMadhesivo para la piel o las tiras [place]hesivas. Es posible que estos deban quedar puestos en la piel durante 2semanas o ms tiempo. Si los bordes de las tiras [address]adhesivas empiezan a despegarse y Scientific laboratory technicianenroscarse, puede recortar los que estn sueltos. No retire las tiras Agilent Technologiesadhesivas por completo a menos que el mdico se lo indique.  Controle todos los das la zona de la incisin para detectar signos de infeccin. Est atento a los siguientes signos: ? Aumento del enrojecimiento, la hinchazn o Chief Technology Officerel dolor. ? Ms lquido [place] Janskyo sangre. ? Calor. ? Pus o mal olor.  Es su responsabilidad retirar Dow Chemicallos  resultados del procedimiento. Pregntele al mdico o consulte en el departamento que realiza el procedimiento cundo CIT Groupestarn los resultados. SOLICITE ATENCIN MDICA SI:  Siente un dolor nuevo en los hombros.  Se desmaya o tiene sensacin de desvanecimiento.  No puede eliminar gases ni defecar.  Siente nuseas o vomita.  Le aparece una erupcin cutnea.  Aumentan el enrojecimiento, la hinchazn o el dolor alrededor de la incisin.  Le sale ms lquido o sangre de la incisin.  La incisin est caliente al tacto.  Tiene pus o percibe que sale mal olor del lugar de la incisin.  Tiene fiebre o siente escalofros. SOLICITE ATENCIN MDICA DE INMEDIATO SI:  El dolor empeora.  Tiene vmitos continuos.  Los bordes de la incisin se abren.  Tiene dificultad para respirar.  Siente dolor en el pecho. Esta informacin no tiene Theme park managercomo fin reemplazar el consejo del mdico. Asegrese de hacerle al mdico cualquier pregunta que tenga. Document Released: 11/24/2015 Document Revised: 11/24/2015 Document Reviewed: 04/15/2015 Elsevier Interactive Patient Education  2018 [employer]vier Inc.

## 2017-02-09 NOTE — Op Note (Addendum)
02/09/2017  12:58 PM  PATIENT:  Courtney Edwards  41 y.o. female  PRE-OPERATIVE DIAGNOSIS:  Pelvic Pain  POST-OPERATIVE DIAGNOSIS:  Pelvic Pain  PROCEDURE:  Procedure(s): LAPAROSCOPY DIAGNOSTIC (N/A)  SURGEON:  Surgeon(s) and Role:    * Jadene Stemmer, Leanora IvanoffMyra C, MD - Primary  ANESTHESIA:   general  EBL:  Total I/O In: 900 [I.V.:900] Out: -   BLOOD ADMINISTERED:none  DRAINS: none   LOCAL MEDICATIONS USED:  MARCAINE     SPECIMEN:  No Specimen  DISPOSITION OF SPECIMEN:  N/A  COUNTS:  YES  TOURNIQUET:  * No tourniquets in log *  DICTATION: .Dragon Dictation   The risks, benefits, alternatives of surgery were explained understood, accepted. All questions were answered. She was taken to the operating room and general anesthesia was applied without complication. She was placed in dorsal lithotomy position. Her abdomen and vagina were prepped and draped in the usual sterile fashion. A time out procedure was done. A bimanual exam revealed a upper limit of normal, size and shape mobile uterus with nonenlarged adnexa. A Hulka manipulator was placed on the cervix. Gloves were changed and attention was turned to the abdomen. Approximately 10 mL of 0.5% Marcaine was used to infiltrate the subcutaneous tissue at the umbilicus. A vertical 5 mm incision was made. A Veres needle was placed in the pelvis. Low-flow CO2 was used to insufflate the abdomen to approximately 3 L. After good pneumoperitoneum was established, a 11 mm trocar was placed. Laparoscopy confirmed correct placement. She was placed in the Trendelenburg position. Patient abdominal pressure was always less than 15. Her uterus ovaries and tubes appeared normal. There was no evidence of endometriosis. There was no evidence of any abnormalities.  The CO2 was allowed to escape from her abdomen.  The subcuticular closure was done with dermabond. The Hulka manipulator was removed. She was extubated and taken to recovery in stable  condition. She tolerated the procedure well. The instrument, sponge, and needle counts were correct.    PLAN OF CARE: Discharge to home after PACU  PATIENT DISPOSITION:  PACU - hemodynamically stable.   Delay start of Pharmacological VTE agent (>24hrs) due to surgical blood loss or risk of bleeding: not applicable

## 2017-02-09 NOTE — Anesthesia Postprocedure Evaluation (Signed)
Anesthesia Post Note  Patient: Aggie CosierMiriam Leticia Constancia [name]  Procedure(s) Performed: Procedure(s) (LRB): LAPAROSCOPY DIAGNOSTIC (N/A)     Patient location during evaluation: PACU Anesthesia Type: General Level of consciousness: awake and alert Pain management: pain level controlled Vital Signs Assessment: post-procedure vital signs reviewed and stable Respiratory status: spontaneous breathing, nonlabored ventilation and respiratory function stable Cardiovascular status: blood pressure returned to baseline and stable Postop Assessment: no signs of nausea or vomiting Anesthetic complications: no    Last Vitals:  Vitals:   02/09/17 1345 02/09/17 1415  BP: 104/86 (!) 97/47  Pulse: (!) 56 (!) 54  Resp: 13 14  Temp:  36.4 C    Last Pain:  Vitals:   02/09/17 1109  TempSrc: Oral   Pain Goal: Patients Stated Pain Goal: 3 (02/09/17 1258)               Lowella CurbWarren Ray Kaidyn Hernandes

## 2017-02-09 NOTE — Anesthesia Preprocedure Evaluation (Signed)
Anesthesia Evaluation  Patient identified by MRN, date of birth, ID band Patient awake    Reviewed: Allergy & Precautions, NPO status , Patient's Chart, lab work & pertinent test results  Airway Mallampati: II  TM Distance: >3 FB Neck ROM: Full    Dental no notable dental hx.    Pulmonary neg pulmonary ROS,    Pulmonary exam normal breath sounds clear to auscultation       Cardiovascular negative cardio ROS Normal cardiovascular exam Rhythm:Regular Rate:Normal     Neuro/Psych Depression negative neurological ROS  negative psych ROS   GI/Hepatic negative GI ROS, Neg liver ROS, GERD  ,  Endo/Other  negative endocrine ROS  Renal/GU negative Renal ROS  negative genitourinary   Musculoskeletal negative musculoskeletal ROS (+)   Abdominal   Peds negative pediatric ROS (+)  Hematology negative hematology ROS (+)   Anesthesia Other Findings   Reproductive/Obstetrics negative OB ROS                             Anesthesia Physical Anesthesia Plan  ASA: II  Anesthesia Plan: General   Post-op Pain Management:    Induction: Intravenous  PONV Risk Score and Plan: 4 or greater and Ondansetron, Dexamethasone, Propofol, Midazolam and Scopolamine patch - Pre-op  Airway Management Planned: Oral ETT  Additional Equipment:   Intra-op Plan:   Post-operative Plan: Extubation in OR  Informed Consent: I have reviewed the patients History and Physical, chart, labs and discussed the procedure including the risks, benefits and alternatives for the proposed anesthesia with the patient or authorized representative who has indicated his/her understanding and acceptance.   Dental advisory given  Plan Discussed with: CRNA  Anesthesia Plan Comments:         Anesthesia Quick Evaluation

## 2017-02-09 NOTE — H&P (Signed)
Linsie Alethia BertholdLeticia Janeece RiggersConstancia [name] is an 41 y.o. female. MHP5 here for a diagnostic laparoscopy for evaluation of new onset pelvic pain since 11/17. She also reports new onset dyspareunia since then. She complains also of feeling like things are falling out. She has had several normal ultrasounds in the last year. She has not taken any IBU or tylenol, says "I know that it will go away." She denies dysmenorrhea but does report pain about a week prior to her periods.  Patient's last menstrual period was 02/02/2017 (exact date).    Past Medical History:  Diagnosis Date  . Hyperthyroidism    no longer requires medications  . Low blood pressure   . Medical history non-contributory   . Mental disorder   . Post partum depression 2002    Past Surgical History:  Procedure Laterality Date  . NO PAST SURGERIES      Family History  Problem Relation Age of Onset  . Hypertension Mother   . Diabetes Mother   . Heart disease Mother     Social History:  reports that she has never smoked. She has never used smokeless tobacco. She reports that she does not drink alcohol or use drugs.  Allergies: No Known Allergies  Prescriptions Prior to Admission  Medication Sig Dispense Refill Last Dose  . atorvastatin (LIPITOR) 20 MG tablet Take 1 tablet (20 mg total) by mouth daily at 6 PM. 90 tablet 3 Past Week at Unknown time  . Cholecalciferol (VITAMIN D3) 5000 units CAPS Take 5,000 Units by mouth daily.   Past Week at Unknown time  . hydrocortisone (ANUSOL-HC) 25 MG suppository Place 1 suppository (25 mg total) rectally 2 (two) times daily. (Patient not taking: Reported on 12/07/2016) 24 suppository 1 Not Taking  . levothyroxine (SYNTHROID) 25 MCG tablet Take 1 tablet (25 mcg total) by mouth daily before breakfast. (Patient not taking: Reported on 02/02/2017) 30 tablet 3 Not Taking at Unknown time  . pantoprazole (PROTONIX) 40 MG tablet Take 1 tablet (40 mg total) by mouth 2 (two) times daily. (Patient not  taking: Reported on 01/03/2017) 30 tablet 0 Not Taking  . polyethylene glycol powder (GLYCOLAX/MIRALAX) powder Take 17 g by mouth 2 (two) times daily as needed. (Patient not taking: Reported on 12/07/2016) 3350 g 1 Not Taking  . pramoxine-hydrocortisone (PROCTOCREAM-HC) 1-1 % rectal cream Place 1 application rectally 2 (two) times daily. (Patient not taking: Reported on 08/23/2016) 30 g 0 Not Taking  . senna-docusate (SENOKOT-S) 8.6-50 MG tablet Take 1 tablet by mouth daily. (Patient not taking: Reported on 12/07/2016) 60 tablet 3 Not Taking  . terbinafine (LAMISIL AT) 1 % cream Apply 1 application topically 2 (two) times daily. (Patient not taking: Reported on 12/07/2016) 30 g 2 Not Taking  . terbinafine (LAMISIL) 250 MG tablet Take 1 tablet (250 mg total) by mouth daily. (Patient not taking: Reported on 08/23/2016) 30 tablet 0 Not Taking    ROS  Blood pressure 112/74, pulse 64, temperature 98 F (36.7 C), temperature source Oral, resp. rate 16, last menstrual period 02/02/2017, SpO2 98 %. Physical Exam  Heart- rrr Lungs- CTAB Abd- benign  Results for orders placed or performed during the hospital encounter of 02/09/17 (from the past 24 hour(s))  Pregnancy, urine     Status: None   Collection Time: 02/09/17 11:00 AM  Result Value Ref Range   Preg Test, Ur NEGATIVE NEGATIVE    No results found.  Assessment/Plan: New onset pelvic pain and dyspareunia- plan for a diagnostic laparoscopy. I  have stressed that I am not certain that I will find the etiology of her discomfort.  She understands the risks of surgery, including, but not to infection, bleeding, DVTs, damage to bowel, bladder, ureters. She wishes to proceed.     Allie Bossier 02/09/2017, 11:52 AM

## 2017-02-09 NOTE — Anesthesia Procedure Notes (Signed)
Procedure Name: Intubation Date/Time: 02/09/2017 12:27 PM Performed by: Tobin Chad Pre-anesthesia Checklist: Patient identified, Emergency Drugs available, Suction available and Patient being monitored Patient Re-evaluated:Patient Re-evaluated prior to inductionOxygen Delivery Method: Circle system utilized and Simple face mask Preoxygenation: Pre-oxygenation with 100% oxygen Intubation Type: IV induction Ventilation: Mask ventilation without difficulty Laryngoscope Size: Mac and 3 Grade View: Grade II Tube type: Oral Tube size: 7.0 mm Number of attempts: 1 Airway Equipment and Method: Stylet Placement Confirmation: ETT inserted through vocal cords under direct vision,  positive ETCO2 and breath sounds checked- equal and bilateral Secured at: 21 cm Tube secured with: Tape Dental Injury: Teeth and Oropharynx as per pre-operative assessment

## 2017-02-09 NOTE — Transfer of Care (Signed)
Immediate Anesthesia Transfer of Care Note  Patient: Courtney Edwards  Procedure(s) Performed: Procedure(s): LAPAROSCOPY DIAGNOSTIC (N/A)  Patient Location: PACU  Anesthesia Type:General  Level of Consciousness: awake, oriented, drowsy and patient cooperative  Airway & Oxygen Therapy: Patient Spontanous Breathing and Patient connected to nasal cannula oxygen  Post-op Assessment: Report given to RN and Post -op Vital signs reviewed and stable  Post vital signs: Reviewed and stable  Last Vitals:  Vitals:   02/09/17 1109 02/09/17 1258  BP: 112/74   Pulse: 64   Resp: 16   Temp: 36.7 C 37.1 C    Last Pain:  Vitals:   02/09/17 1109  TempSrc: Oral      Patients Stated Pain Goal: 3 (02/09/17 1109)  Complications: No apparent anesthesia complications

## 2017-02-10 ENCOUNTER — Encounter (HOSPITAL_COMMUNITY): Payer: Self-pay | Admitting: Obstetrics & Gynecology

## 2017-02-22 ENCOUNTER — Encounter (HOSPITAL_COMMUNITY): Payer: Self-pay | Admitting: Vascular Surgery

## 2017-02-22 ENCOUNTER — Emergency Department (HOSPITAL_COMMUNITY)
Admission: EM | Admit: 2017-02-22 | Discharge: 2017-02-22 | Disposition: A | Discharge status: Home / Self Care | Payer: Self-pay | Attending: Emergency Medicine | Admitting: Emergency Medicine

## 2017-02-22 ENCOUNTER — Ambulatory Visit (HOSPITAL_COMMUNITY)
Admission: EM | Admit: 2017-02-22 | Discharge: 2017-02-22 | Disposition: A | Discharge status: Home / Self Care | Payer: Self-pay | Attending: Family Medicine | Admitting: Family Medicine

## 2017-02-22 ENCOUNTER — Emergency Department (HOSPITAL_COMMUNITY): Payer: Self-pay

## 2017-02-22 ENCOUNTER — Encounter (HOSPITAL_COMMUNITY): Payer: Self-pay | Admitting: *Deleted

## 2017-02-22 DIAGNOSIS — R109 Unspecified abdominal pain: Secondary | ICD-10-CM | POA: Insufficient documentation

## 2017-02-22 DIAGNOSIS — R519 Headache, unspecified: Secondary | ICD-10-CM

## 2017-02-22 DIAGNOSIS — E860 Dehydration: Secondary | ICD-10-CM | POA: Insufficient documentation

## 2017-02-22 DIAGNOSIS — R1084 Generalized abdominal pain: Secondary | ICD-10-CM

## 2017-02-22 DIAGNOSIS — R51 Headache: Secondary | ICD-10-CM | POA: Insufficient documentation

## 2017-02-22 DIAGNOSIS — Z79899 Other long term (current) drug therapy: Secondary | ICD-10-CM | POA: Insufficient documentation

## 2017-02-22 LAB — CBC
HEMATOCRIT: 41.7 % (ref 36.0–46.0)
HEMOGLOBIN: 13.8 g/dL (ref 12.0–15.0)
MCH: 29.4 pg (ref 26.0–34.0)
MCHC: 33.1 g/dL (ref 30.0–36.0)
MCV: 88.7 fL (ref 78.0–100.0)
Platelets: 432 10*3/uL — ABNORMAL HIGH (ref 150–400)
RBC: 4.7 MIL/uL (ref 3.87–5.11)
RDW: 13.6 % (ref 11.5–15.5)
WBC: 10.6 10*3/uL — ABNORMAL HIGH (ref 4.0–10.5)

## 2017-02-22 LAB — COMPREHENSIVE METABOLIC PANEL
ALT: 55 U/L — ABNORMAL HIGH (ref 14–54)
AST: 43 U/L — ABNORMAL HIGH (ref 15–41)
Albumin: 3.9 g/dL (ref 3.5–5.0)
Alkaline Phosphatase: 75 U/L (ref 38–126)
Anion gap: 9 (ref 5–15)
BUN: 8 mg/dL (ref 6–20)
CHLORIDE: 104 mmol/L (ref 101–111)
CO2: 24 mmol/L (ref 22–32)
CREATININE: 0.76 mg/dL (ref 0.44–1.00)
Calcium: 9.7 mg/dL (ref 8.9–10.3)
GFR calc Af Amer: >60 mL/min (ref >60)
Glucose, Bld: 107 mg/dL — ABNORMAL HIGH (ref 65–99)
POTASSIUM: 3.8 mmol/L (ref 3.5–5.1)
Sodium: 137 mmol/L (ref 135–145)
TOTAL PROTEIN: 7.8 g/dL (ref 6.5–8.1)
Total Bilirubin: 0.7 mg/dL (ref 0.3–1.2)

## 2017-02-22 LAB — URINALYSIS, ROUTINE W REFLEX MICROSCOPIC
BILIRUBIN URINE: NEGATIVE
GLUCOSE, UA: NEGATIVE mg/dL
HGB URINE DIPSTICK: NEGATIVE
Ketones, ur: NEGATIVE mg/dL
Leukocytes, UA: NEGATIVE
Nitrite: NEGATIVE
PH: 5 (ref 5.0–8.0)
Protein, ur: NEGATIVE mg/dL
SPECIFIC GRAVITY, URINE: 1.024 (ref 1.005–1.030)

## 2017-02-22 LAB — I-STAT CG4 LACTIC ACID, ED
LACTIC ACID, VENOUS: 1.02 mmol/L (ref 0.5–1.9)
Lactic Acid, Venous: 2.21 mmol/L (ref 0.5–1.9)

## 2017-02-22 LAB — I-STAT BETA HCG BLOOD, ED (MC, WL, AP ONLY): I-stat hCG, quantitative: <5 m[IU]/mL (ref <5)

## 2017-02-22 LAB — LIPASE, BLOOD: LIPASE: 22 U/L (ref 11–51)

## 2017-02-22 MED ORDER — IOPAMIDOL (ISOVUE-300) INJECTION 61%
INTRAVENOUS | Status: AC
  Administered 2017-02-22: 100 mL
  Filled 2017-02-22: qty 100

## 2017-02-22 MED ORDER — SODIUM CHLORIDE 0.9 % IV BOLUS (SEPSIS)
500.0000 mL | Freq: Once | INTRAVENOUS | Status: AC
  Administered 2017-02-22: 500 mL via INTRAVENOUS

## 2017-02-22 MED ORDER — SODIUM CHLORIDE 0.9 % IV BOLUS (SEPSIS)
1000.0000 mL | Freq: Once | INTRAVENOUS | Status: AC
  Administered 2017-02-22: 1000 mL via INTRAVENOUS

## 2017-02-22 NOTE — ED Triage Notes (Signed)
Pt reports to the ED for eval of abd pain, generalized weakness, lethargy, and HAs since she had a procedure done in June. [facility] performed. Procedure performed on 06/27. Her husband thinks that her BP is dropping but he has not checked it at home. Pt has been feeling poorly since the procedure but yesterday her symptoms became worse. Denies any N/V/D, vaginal bleeding or d/c, or urinary symptoms.

## 2017-02-22 NOTE — Discharge Instructions (Signed)
It was my pleasure taking care of you today!   Increase hydration. Please follow up with your OBGYN for further post-operative pain.   Return to ER for new or worsening symptoms, any additional concerns.

## 2017-02-22 NOTE — ED Notes (Signed)
Patient transported to CT 

## 2017-02-22 NOTE — ED Triage Notes (Signed)
Pt   Reports    Weakness     And   abd    Pain       Headache   Lethargic      Pt  Had  A  Biopsy    On  27  June     At  [facility]    Husband  At  Bedside      [employer] in to  Interpret

## 2017-02-22 NOTE — ED Provider Notes (Signed)
[facility] Provider Note   CSN: [id number] Arrival date & time: 02/22/17  1056     History   Chief Complaint Chief Complaint  Patient presents with  . Post-op Problem    HPI Courtney Edwards is a 41 y.o. female.  The history is provided by the patient and medical records. A language interpreter was used (Spanish video interpreter).   Courtney Edwards Courtney Edwards is a 41 y.o. female  with a PMH of pelvic pain who presents to the Emergency Department from urgent care for further evaluation. Per chart review, patient presented to the urgent care today for generalized abdominal pain which began approximately a week after diagnostic laparoscopy (performed on 02/09/17 - negative with no acute findings to explain pain). She also endorses low-grade fever and generalized pounding headache. Urgent care referred her to the emergency department for concerns of intra-abdominal infection And abdominal pain and fever. During my evaluation, patient states that her abdomen doesn't hurt right now, but it has intermittently been cramping over the week and she has had some bleeding from the site at the umbilicus were trocar was placed. Her main complaint today is her headache. She endorses associated chills and generalized body aches. She did not take her temperature, but felt as if she may have a fever. Afebrile at urgent care earlier and upon ED arrival. Patient states that she has been taking Percocet and ibuprofen as needed for her abdominal pain. She has not taken any medications today-last dose was last night. No neck pain, dysuria, chest pain or trouble breathing.   Past Medical History:  Diagnosis Date  . Hyperthyroidism    no longer requires medications  . Low blood pressure   . Medical history non-contributory   . Mental disorder   . Post partum depression 2002    Patient Active Problem List   Diagnosis Date Noted  . Morbid obesity (HCC) 01/03/2017  . HLD  (hyperlipidemia) 02/06/2015  . Dyspareunia 05/20/2014  . Unspecified constipation 08/06/2013  . Onychomycosis 08/06/2013  . Hemorrhoid 08/06/2013  . GERD (gastroesophageal reflux disease) 08/06/2013  . Muscle ache 08/06/2013  . BLURRED VISION 03/02/2007  . DELIVERY, NORMAL 03/02/2007  . LOW BACK PAIN, MILD 03/02/2007  . HYPERGLYCEMIA 11/03/2006    Past Surgical History:  Procedure Laterality Date  . LAPAROSCOPY N/A 02/09/2017   Procedure: LAPAROSCOPY DIAGNOSTIC;  Surgeon: Allie Bossier, MD;  Location: [facility];  Service: Gynecology;  Laterality: N/A;  . NO PAST SURGERIES      OB History    Gravida Para Term Preterm AB Living   5 5 5  0 0 5   SAB TAB Ectopic Multiple Live Births   0 0   0 1       Home Medications    Prior to Admission medications   Medication Sig Start Date End Date Taking? Authorizing Provider  atorvastatin (LIPITOR) 20 MG tablet Take 1 tablet (20 mg total) by mouth daily at 6 PM. 08/24/16  Yes Langeland, Dawn T, MD  ibuprofen (ADVIL,MOTRIN) 600 MG tablet Take 1 tablet (600 mg total) by mouth every 6 (six) hours as needed. Patient taking differently: Take 600 mg by mouth every 6 (six) hours as needed for headache or moderate pain.  02/09/17  Yes Dove, Myra C, MD  oxyCODONE-acetaminophen (PERCOCET/ROXICET) 5-325 MG tablet Take 1-2 tablets by mouth every 6 (six) hours as needed. Patient taking differently: Take 1-2 tablets by mouth every 6 (six) hours as needed for moderate pain.  02/09/17  Yes Dove, Myra C, MD  pantoprazole (PROTONIX) 40 MG tablet Take 1 tablet (40 mg total) by mouth 2 (two) times daily. 10/05/16  Yes Pete Glatter, MD  levothyroxine (SYNTHROID) 25 MCG tablet Take 1 tablet (25 mcg total) by mouth daily before breakfast. Patient not taking: Reported on 02/02/2017 08/24/16   Dierdre Searles T, MD  polyethylene glycol powder (GLYCOLAX/MIRALAX) powder Take 17 g by mouth 2 (two) times daily as needed. Patient not taking: Reported on 12/07/2016 09/27/16    Pete Glatter, MD    Family History Family History  Problem Relation Age of Onset  . Hypertension Mother   . Diabetes Mother   . Heart disease Mother     Social History Social History  Substance Use Topics  . Smoking status: Never Smoker  . Smokeless tobacco: Never Used  . Alcohol use No     Allergies   Patient has no known allergies.   Review of Systems Review of Systems  Constitutional: Positive for chills, fatigue and fever (Subjective).  Gastrointestinal: Positive for abdominal pain. Negative for blood in stool, constipation, diarrhea, nausea and vomiting.  Musculoskeletal: Negative for neck pain.  Neurological: Positive for headaches. Negative for dizziness, seizures, syncope and numbness.  All other systems reviewed and are negative.    Physical Exam Updated Vital Signs BP 117/64   Pulse 62   Temp 98.2 F (36.8 C) (Oral)   Resp 16   LMP 02/15/2017   SpO2 99%   Physical Exam  Constitutional: She is oriented to person, place, and time. She appears well-developed and well-nourished. No distress.  HENT:  Head: Normocephalic and atraumatic.  Neck:  No meningeal signs.  Cardiovascular: Normal rate, regular rhythm and normal heart sounds.   No murmur heard. Pulmonary/Chest: Effort normal and breath sounds normal. No respiratory distress.  Abdominal: Soft. Bowel sounds are normal. She exhibits no distension.  Diffuse abdominal tenderness. Umbilical incision site with eschar. No surrounding erythema, warmth to the touch. No visible abscess noted.  Musculoskeletal: She exhibits no edema.  Neurological: She is alert and oriented to person, place, and time.  CN 2-12 grossly intact. Normal finger-to-nose and rapid alternating movements. No drift. Sensation intact.  Skin: Skin is warm and dry.  Nursing note and vitals reviewed.    ED Treatments / Results  Labs (all labs ordered are listed, but only abnormal results are displayed) Labs Reviewed    COMPREHENSIVE METABOLIC PANEL - Abnormal; Notable for the following:       Result Value   Glucose, Bld 107 (*)    AST 43 (*)    ALT 55 (*)    All other components within normal limits  CBC - Abnormal; Notable for the following:    WBC 10.6 (*)    Platelets 432 (*)    All other components within normal limits  URINALYSIS, ROUTINE W REFLEX MICROSCOPIC - Abnormal; Notable for the following:    APPearance HAZY (*)    All other components within normal limits  I-STAT CG4 LACTIC ACID, ED - Abnormal; Notable for the following:    Lactic Acid, Venous 2.21 (*)    All other components within normal limits  LIPASE, BLOOD  I-STAT BETA HCG BLOOD, ED ([facility], [facility], [facility] ONLY)  I-STAT CG4 LACTIC ACID, ED  I-STAT CG4 LACTIC ACID, ED    EKG  EKG Interpretation None       Radiology Ct Head Wo Contrast  Result Date: 02/22/2017 CLINICAL DATA:  Dizziness and headache since yesterday EXAM:  CT HEAD WITHOUT CONTRAST TECHNIQUE: Contiguous axial images were obtained from the base of the skull through the vertex without intravenous contrast. Sagittal and coronal MPR images reconstructed from axial data set. COMPARISON:  None FINDINGS: Brain: Normal ventricular morphology. No midline shift or mass effect. Normal appearance of brain parenchyma. No intracranial hemorrhage, mass lesion or evidence acute infarction. No extra-axial fluid collections. Vascular: Unremarkable Skull: Intact Sinuses/Orbits: Mucosal retention cyst LEFT maxillary sinus. Remaining visualized paranasal sinuses and mastoid air cells clear Other: N/A IMPRESSION: No acute intracranial abnormalities. Electronically Signed   By: Ulyses SouthwardMark  Boles M.D.   On: 02/22/2017 12:59   Ct Abdomen Pelvis W Contrast  Result Date: 02/22/2017 CLINICAL DATA:  41 year old female with abdominal surgery 2 weeks ago. bleeding from surgical wound. EXAM: CT ABDOMEN AND PELVIS WITH CONTRAST TECHNIQUE: Multidetector CT imaging of the abdomen and pelvis was performed using the  standard protocol following bolus administration of intravenous contrast. CONTRAST:  100mL ISOVUE-300 IOPAMIDOL (ISOVUE-300) INJECTION 61% COMPARISON:  CT the abdomen and pelvis 02/24/2011. FINDINGS: Lower chest: Unremarkable. Hepatobiliary: No cystic or solid hepatic lesions. No intra or extrahepatic biliary ductal dilatation. Gallbladder is normal in appearance. Pancreas: No pancreatic mass. No pancreatic ductal dilatation. No pancreatic or peripancreatic fluid or inflammatory changes. Spleen: Unremarkable. Adrenals/Urinary Tract: Bilateral kidneys and bilateral adrenal glands are normal in appearance. No hydroureteronephrosis. Urinary bladder is normal in appearance. Stomach/Bowel: Normal appearance of the stomach. No pathologic dilatation of small bowel or colon. The appendix is not confidently identified and may be surgically absent. Regardless, there are no inflammatory changes noted adjacent to the cecum to suggest the presence of an acute appendicitis at this time. Vascular/Lymphatic: No significant atherosclerotic disease, aneurysm or dissection noted in the abdominal or pelvic vasculature. No lymphadenopathy identified in the abdomen or pelvis. Reproductive: Uterus and ovaries are grossly unremarkable in appearance. Other: No high attenuation fluid collection in the peritoneal cavity or retroperitoneum to suggest postsurgical hemorrhage. Small umbilical hernia containing only omental fat. No significant volume of ascites. No pneumoperitoneum. Musculoskeletal: There are no aggressive appearing lytic or blastic lesions noted in the visualized portions of the skeleton. IMPRESSION: 1. No acute findings in the abdomen or pelvis to account for the patient's symptoms. 2. Tiny umbilical hernia containing only omental fat. Electronically Signed   By: Trudie Reedaniel  Entrikin M.D.   On: 02/22/2017 13:22    Procedures Procedures (including critical care time)  Medications Ordered in ED Medications  sodium chloride  0.9 % bolus 1,000 mL (0 mLs Intravenous Stopped 02/22/17 1344)  iopamidol (ISOVUE-300) 61 % injection (100 mLs  Contrast Given 02/22/17 1302)  sodium chloride 0.9 % bolus 500 mL (0 mLs Intravenous Stopped 02/22/17 1501)     Initial Impression / Assessment and Plan / ED Course  I have reviewed the triage vital signs and the nursing notes.  Pertinent labs & imaging results that were available during my care of the patient were reviewed by me and considered in my medical decision making (see chart for details).    Crescentia Alethia BertholdLeticia Courtney RiggersConstancia Edwards is a 41 y.o. female who presents to ED for abdominal pain and headache intermittently over the last week. Patient underwent Diagnostic laparoscopy for pelvic pain on 6/27. Per procedure note, there was no evidence of any abnormalities and no explanation for pelvic pain. Patient presented to urgent care earlier today and endorsed abdominal pain and low-grade fever. She was afebrile in the urgent care as well as upon ER arrival today and has had no antipyretic medications today.  Given abdominal pain with recent surgery and subjective fevers, patient was transferred to ER for further workup. On exam, patient is hemodynamically stable with a nonsurgical abdomen. She does have tenderness, but no rebound or guarding. No focal areas of tenderness. Incision site does not appear infected. She is complaining of a throbbing headache. No focal neuro deficits appreciated on exam. No meningeal signs. Labs obtained and reassuring. Initial lactic minimally elevated. No documented fevers. Likely 2/2 dehydration. Patient given 1500cc of fluids. Lactic returned to normal value of 1.02. CT head and abdomen negative.  On re-evaluation, patient states that she feels much better. She is ambulatory, tolerating PO and with no abdominal tenderness. Evaluation does not show pathology that would require ongoing emergent intervention or inpatient treatment. Will have patient follow up with GYN  surgeon who performed surgery. Reasons to return to ER, home care instructions and follow up care instructions were discussed with patient and family at bedside using spanish video interpreter. All questions answered.   Patient discussed with Dr. Freida Busman who agrees with treatment plan.   Final Clinical Impressions(s) / ED Diagnoses   Final diagnoses:  Abdominal pain  Acute nonintractable headache, unspecified headache type  Dehydration    New Prescriptions Discharge Medication List as of 02/22/2017  2:40 PM       Gisele Pack, Chase Picket, PA-C 02/22/17 1517    Lorre Nick, MD 02/25/17 2344

## 2017-02-22 NOTE — ED Notes (Signed)
Pt  Has  intermittant   abd  Pain  since

## 2017-02-22 NOTE — ED Provider Notes (Signed)
[facility]    CSN: [id number] Arrival date & time: 02/22/17  1004     History   Chief Complaint Chief Complaint  Patient presents with  . Abdominal Pain    HPI Courtney Edwards is a 41 y.o. female with a history of pelvic pain s/p diagnostic laparoscopy 6/27/2/108 presenting for abdominal pain, malaise, fever.   Her husband assists with the history due to patient's malaise. In-person Spanish interpretor is used. She has had moderate, constant, generalized abdominal pain worsening over the past week, which started about 1 week after laparoscopy. It is not radiating, and she's taken no medications that helped. There was no evidence of endometriosis at time of procedure. Over the past few days she's had a low-grade fever and developed pounding general headache. This worsened today prompting visit to urgent care.   HPI  Past Medical History:  Diagnosis Date  . Hyperthyroidism    no longer requires medications  . Low blood pressure   . Medical history non-contributory   . Mental disorder   . Post partum depression 2002    Patient Active Problem List   Diagnosis Date Noted  . Morbid obesity (HCC) 01/03/2017  . HLD (hyperlipidemia) 02/06/2015  . Dyspareunia 05/20/2014  . Unspecified constipation 08/06/2013  . Onychomycosis 08/06/2013  . Hemorrhoid 08/06/2013  . GERD (gastroesophageal reflux disease) 08/06/2013  . Muscle ache 08/06/2013  . BLURRED VISION 03/02/2007  . DELIVERY, NORMAL 03/02/2007  . LOW BACK PAIN, MILD 03/02/2007  . HYPERGLYCEMIA 11/03/2006    Past Surgical History:  Procedure Laterality Date  . LAPAROSCOPY N/A 02/09/2017   Procedure: LAPAROSCOPY DIAGNOSTIC;  Surgeon: Allie Bossierove, Myra C, MD;  Location: [facility];  Service: Gynecology;  Laterality: N/A;  . NO PAST SURGERIES      OB History    Gravida Para Term Preterm AB Living   5 5 5  0 0 5   SAB TAB Ectopic Multiple Live Births   0 0   0 1       Home Medications    Prior to  Admission medications   Medication Sig Start Date End Date Taking? Authorizing Provider  atorvastatin (LIPITOR) 20 MG tablet Take 1 tablet (20 mg total) by mouth daily at 6 PM. 08/24/16   Langeland, Dawn T, MD  Cholecalciferol (VITAMIN D3) 5000 units CAPS Take 5,000 Units by mouth daily.    [provider]  ibuprofen (ADVIL,MOTRIN) 600 MG tablet Take 1 tablet (600 mg total) by mouth every 6 (six) hours as needed. 02/09/17   Allie Bossierove, Myra C, MD  levothyroxine (SYNTHROID) 25 MCG tablet Take 1 tablet (25 mcg total) by mouth daily before breakfast. Patient not taking: Reported on 02/02/2017 08/24/16   Pete GlatterLangeland, Dawn T, MD  oxyCODONE-acetaminophen (PERCOCET/ROXICET) 5-325 MG tablet Take 1-2 tablets by mouth every 6 (six) hours as needed. 02/09/17   Allie Bossierove, Myra C, MD  pantoprazole (PROTONIX) 40 MG tablet Take 1 tablet (40 mg total) by mouth 2 (two) times daily. Patient not taking: Reported on 01/03/2017 10/05/16   Dierdre SearlesLangeland, Dawn T, MD  polyethylene glycol powder (GLYCOLAX/MIRALAX) powder Take 17 g by mouth 2 (two) times daily as needed. Patient not taking: Reported on 12/07/2016 09/27/16   Pete GlatterLangeland, Dawn T, MD    Family History Family History  Problem Relation Age of Onset  . Hypertension Mother   . Diabetes Mother   . Heart disease Mother     Social History Social History  Substance Use Topics  . Smoking status: Never Smoker  .  Smokeless tobacco: Never Used  . Alcohol use No     Allergies   Patient has no known allergies.   Review of Systems Review of Systems As above. No vaginal bleeding or discharge. LMP 7/3  Physical Exam Triage Vital Signs ED Triage Vitals [02/22/17 1028]  Enc Vitals Group     BP 123/77     Pulse Rate 71     Resp 16     Temp 98.7 F (37.1 C)     Temp Source Oral     SpO2 100 %     Weight      Height      Head Circumference      Peak Flow      Pain Score      Pain Loc      Pain Edu?      Excl. in GC?    No data found.   Updated Vital Signs BP  123/77 (BP Location: Left Arm)   Pulse 71   Temp 98.7 F (37.1 C) (Oral)   Resp 16   LMP 02/15/2017   SpO2 100%   Visual Acuity Right Eye Distance:   Left Eye Distance:   Bilateral Distance:    Right Eye Near:   Left Eye Near:    Bilateral Near:     Physical Exam BP 123/77 (BP Location: Left Arm)   Pulse 71   Temp 98.7 F (37.1 C) (Oral)   Resp 16   LMP 02/15/2017   SpO2 100%  Gen: Weak, unwell-appearing 41 y.o.female sitting in wheelchair with eyes closed Neck: No meningismus HEENT: MMM, posterior oropharynx clear Pulm: Non-labored; CTAB, no wheezes  CV: Regular rate, no murmur appreciated; distal pulses intact/symmetric GI: +BS; soft, diffusely tender with guarding.  Skin: Umbilical incision site with eschar without surrounding erythema, otherwise no rashes, wounds, ulcers Neuro: A&Ox3, CN II-XII without deficits  UC Treatments / Results  Labs (all labs ordered are listed, but only abnormal results are displayed) Labs Reviewed - No data to display  EKG  EKG Interpretation None       Radiology No results found.  Procedures Procedures (including critical care time)  Medications Ordered in UC Medications - No data to display   Initial Impression / Assessment and Plan / UC Course  I have reviewed the triage vital signs and the nursing notes.  Pertinent labs & imaging results that were available during my care of the patient were reviewed by me and considered in my medical decision making (see chart for details).     Final Clinical Impressions(s) / UC Diagnoses   Final diagnoses:  Generalized abdominal pain   41yo F 2 weeks s/p diagnostic laparoscopic with abdominal pain, fever, and exam concerning for intraabdominal infection. Will require abdominal imaging as part of further work up, so will send to ED. She is hemodynamically stable for transfer.   New Prescriptions Discharge Medication List as of 02/22/2017 10:52 AM       Tyrone Nine,  MD 02/22/17 1053

## 2017-03-21 ENCOUNTER — Encounter: Payer: Self-pay | Admitting: Family Medicine

## 2017-03-28 ENCOUNTER — Encounter: Payer: Self-pay | Admitting: Family Medicine

## 2017-03-28 ENCOUNTER — Ambulatory Visit: Payer: Self-pay | Attending: Family Medicine | Admitting: Family Medicine

## 2017-03-28 ENCOUNTER — Ambulatory Visit (INDEPENDENT_AMBULATORY_CARE_PROVIDER_SITE_OTHER): Payer: Self-pay | Admitting: Obstetrics & Gynecology

## 2017-03-28 VITALS — BP 96/64 | HR 61 | Temp 98.3°F | Resp 18 | Ht 64.0 in | Wt 177.6 lb

## 2017-03-28 VITALS — BP 107/56 | HR 61 | Wt 177.4 lb

## 2017-03-28 DIAGNOSIS — E079 Disorder of thyroid, unspecified: Secondary | ICD-10-CM | POA: Insufficient documentation

## 2017-03-28 DIAGNOSIS — N946 Dysmenorrhea, unspecified: Secondary | ICD-10-CM | POA: Insufficient documentation

## 2017-03-28 DIAGNOSIS — Z9889 Other specified postprocedural states: Secondary | ICD-10-CM

## 2017-03-28 DIAGNOSIS — N941 Unspecified dyspareunia: Secondary | ICD-10-CM

## 2017-03-28 DIAGNOSIS — Z8639 Personal history of other endocrine, nutritional and metabolic disease: Secondary | ICD-10-CM

## 2017-03-28 DIAGNOSIS — Z113 Encounter for screening for infections with a predominantly sexual mode of transmission: Secondary | ICD-10-CM

## 2017-03-28 DIAGNOSIS — Z01419 Encounter for gynecological examination (general) (routine) without abnormal findings: Secondary | ICD-10-CM | POA: Insufficient documentation

## 2017-03-28 DIAGNOSIS — R102 Pelvic and perineal pain: Secondary | ICD-10-CM | POA: Insufficient documentation

## 2017-03-28 DIAGNOSIS — R1030 Lower abdominal pain, unspecified: Secondary | ICD-10-CM

## 2017-03-28 LAB — POCT URINALYSIS DIPSTICK
Bilirubin, UA: NEGATIVE
GLUCOSE UA: NEGATIVE
Ketones, UA: NEGATIVE
Leukocytes, UA: NEGATIVE
NITRITE UA: NEGATIVE
PROTEIN UA: NEGATIVE
RBC UA: NEGATIVE
UROBILINOGEN UA: 0.2 U/dL
pH, UA: 5.5 (ref 5.0–8.0)

## 2017-03-28 LAB — POCT URINE PREGNANCY: Preg Test, Ur: NEGATIVE

## 2017-03-28 MED ORDER — IBUPROFEN 600 MG PO TABS: 600.0000 mg | ORAL_TABLET | Freq: Three times a day (TID) | ORAL | 0 refills | Status: DC | PRN

## 2017-03-28 NOTE — Progress Notes (Signed)
   Subjective:    Patient ID: Courtney Edwards, female    DOB: 08/16/1975, 41 y.o.   MRN: [id number]  HPI  41 yo MHP5 here for a post op visit. She had a diagnostic laparoscopy on 02-09-17 for new onset dyspareunia. She has not had sex since surgery. She describes new onset daily bilateral pelvic pain. She has not tried any IBU or tylenol yet. She reports that she has a BM about every 2 or 3 days.  Review of Systems     Objective:   Physical Exam Well nourished, well hydrated Hispanic female, no apparent distress Breathing, conversing, and ambulating normally Abd- benign Umbilical incision well-healed Interpretor present for exam        Assessment & Plan:  Dyspareunia- await to see if she still has pain after surgery Pelvic discomfort- possibly related to constipation- rec Miralax If no better in a month, then consider a referral to Courtney Edwards, pelvic PT RTC 6 months for annual

## 2017-03-28 NOTE — Progress Notes (Signed)
Patient is here for PAP 

## 2017-03-28 NOTE — Patient Instructions (Signed)
Autoexamen de mamas (Breast Self-Awareness) El autoexamen de mamas significa lo siguiente:  Saber cul es el aspecto de las mamas.  Saber cmo se las siente al tacto.  Controlarse las mamas mensualmente para detectar cambios.  Informar el mdico si advierte un cambio en las mamas. El autoexamen de mama le permite advertir problemas en la mama con anticipacin, cuando todava son pequeos. CMO REALIZAR EL AUTOEXAMEN DE MAMAS Una forma de aprender qu es normal para las mamas y revisar si hay cambios es hacerse un autoexamen de las mamas. Para hacer un autoexamen de las mamas: Busque cambios 1. Qutese toda la ropa por encima de la cintura. 2. Prese frente a un espejo en una habitacin con buena iluminacin. 3. Apoye las manos en las caderas. 4. Empuje hacia abajo con las manos. 5. Mrese las mamas y los pezones en el espejo, para ver si hay diferencias entre s. Durante el examen, intente determinar si:  La forma de una mama es diferente.  El tamao de una mama es diferente.  Hay arrugas, depresiones y protuberancias en una mama y no en la otra. 1. Observe cada mama para buscar cambios en la piel, por ejemplo:  Enrojecimiento.  Zonas escamosas. 1. Observe si hay cambios en los pezones, por ejemplo:  Lquido alrededor de los pezones.  Hemorragia.  Hoyuelos.  Enrojecimiento.  Un cambio en el lugar de los pezones. Plpese para detectar si hay cambios 1. Acustese en el piso boca arriba. 2. Plpese cada mama. Para hacerlo, siga estos pasos:  Elija una mama para palpar.  Coloque el brazo ms cercano a esa mama por encima de la cabeza.  Use el otro brazo para palpar la zona del pezn de la mama. Plpese la zona con las yemas de los tres dedos del medio y haga crculos con los dedos. Con el primer crculo, presione suavemente. Con el segundo, ms fuerte. Con el tercero, an ms fuerte.  Siga haciendo crculos con los dedos con la presin suave, fuerte e incluso ms  fuerte a medida que desciende por la mama. Detngase cuando sienta las costillas.  Desplace los dedos un poco hacia el centro del cuerpo.  Empiece a hacer crculos con los dedos nuevamente y esta vez haga movimientos ascendentes hasta llegar a la clavcula.  Siga haciendo crculos hacia arriba y hacia abajo hasta llegar a la axila. Recuerde hacerlos con las tres presiones.  Plpese la otra mama de la misma forma. 1. Sintese o prese en la ducha o la baera. 2. Con agua jabonosa en la piel, plpese cada mama del mismo modo que lo hizo en el paso2, mientras estaba acostada en el piso. Anote sus hallazgos Despus del autoexamen, anote lo siguiente:  Qu es normal para cada mama.  Cualquier cambio que haya encontrado en cada mama.  Cundo tuvo la ltima menstruacin. CON QU FRECUENCIA DEBO EXAMINARME LAS MAMAS? Examnese las mamas todos los meses. Si est amamantando, el mejor momento para el examen es despus de darle de mamar al beb o despus de usar un sacaleches. Si menstra, el mejor momento para hacerlo es 5 a 7das despus de la finalizada la menstruacin. CUNDO DEBO VISITAR AL MDICO? Visite al mdico si advierte lo siguiente:  Un cambio en la forma o el tamao de las mamas o los pezones.  Un cambio en la piel de las mamas o los pezones, como la piel enrojecida o escamosa.  Secrecin de un lquido fuera de lo comn de los pezones.  Un ndulo o una   zona engrosada que no tena antes.  Dolor de mamas.  Cualquier cosa que la preocupe. Esta informacin no tiene como fin reemplazar el consejo del mdico. Asegrese de hacerle al mdico cualquier pregunta que tenga. Document Released: 09/04/2010 Document Revised: 11/24/2015 Document Reviewed: 06/22/2015 Elsevier Interactive Patient Education  2018 Elsevier Inc.  Prueba de Papanicolaou (Pap Test) POR QU ME DEBO REALIZAR ESTA PRUEBA? A esta prueba tambin se la denomina "frotis de Pap". Es una prueba de deteccin que se  utiliza para detectar signos de cncer de vagina, cuello del tero y tero. La prueba tambin puede identificar la presencia de infeccin o cambios precancerosos. El mdico probablemente le recomiende que se realice esta prueba en forma regular. Esta prueba puede realizarse de la siguiente manera:  Cada 3 aos, a partir de los 21 aos.  Cada 5 aos, en combinacin con las pruebas que se realizan para detectar la presencia del virus del papiloma humano (VPH).  Con mayor o menor frecuencia, en funcin de otras enfermedades que tenga. QU TIPO DE MUESTRA SE TOMA? El mdico utilizar un pequeo hisopo de algodn, una esptula de plstico o un cepillo para recolectar una muestra de clulas de la superficie del cuello del tero. El cuello del tero es la apertura del tero, que tambin se conoce como matriz. Tambin pueden recolectarse las secreciones del cuello del tero y la vagina. CMO DEBO PREPARARME PARA ESTA PRUEBA?  Tenga en cuenta en qu etapa del ciclo menstrual se encuentra. Es posible que deba reprogramar la prueba si est menstruando el da en que debe realizrsela.  Si el da en que debe realizarse la prueba tiene una infeccin vaginal aparente, deber reprogramar la prueba.  Pueden pedirle que evite tomar una ducha o bao el da de la prueba o el da anterior.  Algunos medicamentos pueden provocar resultados anormales de la prueba, como los digitlicos y la tetraciclina. Si toma alguno de estos medicamentos, hable con su mdico antes de realizarse la prueba. QU SIGNIFICAN LOS RESULTADOS? Los resultados anormales de la prueba pueden indicar diversas enfermedades. Estas pueden incluir lo siguiente:  Cncer. Si bien los resultados de la prueba de Papanicolaou no pueden utilizarse para diagnosticar cncer de cuello del tero, de vagina o de tero, pueden indicar que existe una posibilidad de presencia de cncer. En este caso, ser necesario realizar pruebas adicionales para determinar  la presencia de cncer.  Enfermedad de transmisin sexual.  Infecciones por hongos.  Infeccin por parsitos.  Infeccin por herpes.  Una enfermedad que causa o favorece la infertilidad. Es su responsabilidad retirar el resultado del estudio. Consulte en el laboratorio o en el departamento en el que fue realizado el estudio cundo y cmo podr obtener los resultados. Comunquese con el mdico si tiene preguntas sobre los resultados. Esta informacin no tiene como fin reemplazar el consejo del mdico. Asegrese de hacerle al mdico cualquier pregunta que tenga. Document Released: 01/19/2008 Document Revised: 08/23/2014 Document Reviewed: 12/24/2013 Elsevier Interactive Patient Education  2018 Elsevier Inc.  

## 2017-03-28 NOTE — Progress Notes (Signed)
[facility] [place] @ [phone]) [phone].  Pt stated that she

## 2017-03-28 NOTE — Progress Notes (Signed)
Spanish [employer] Mariel

## 2017-03-28 NOTE — Progress Notes (Signed)
Subjective:  Patient ID: Courtney Edwards, female    DOB: 03/19/1976  Age: 41 y.o. MRN: [id number]  CC: Gynecologic Exam   HPI Courtney Edwards presents for well woman visit with gynecological examination. She denies any family history or breast or gynecological cancers. She denies any lumps,nipple discharge, denting or dimpling of the breast.She does not perform monthly SBE. She is a non-smoker.She requestsSTI testing with examination. She denies any vaginal discharge,lesions or dysuria. Reports 1 sexual partner within the last 3 months. She does reports chronic lower abdominal/ pelvic pain. Ongoing problem since November of last year. Previous workup has included gynecologist referral, diagnostic laparoscopy, CT ABD/pelvis which have been unremarkable. Was seen in ED for abdominal pain and CT was done. She reports 3 episodes since. Last gynecologist was.today. Patient encouraged to follow gyn recommendations. She denies any N/V, hematochezia, melena, or poor appetite. She does reports intermittent abdominal bloating. Menstrual cycles every month which  last for 8 days, LPM 7/23.  Outpatient Medications Prior to Visit  Medication Sig Dispense Refill  . atorvastatin (LIPITOR) 20 MG tablet Take 1 tablet (20 mg total) by mouth daily at 6 PM. (Patient not taking: Reported on 03/28/2017) 90 tablet 3  . levothyroxine (SYNTHROID) 25 MCG tablet Take 1 tablet (25 mcg total) by mouth daily before breakfast. (Patient not taking: Reported on 02/02/2017) 30 tablet 3  . oxyCODONE-acetaminophen (PERCOCET/ROXICET) 5-325 MG tablet Take 1-2 tablets by mouth every 6 (six) hours as needed. (Patient not taking: Reported on 03/28/2017) 15 tablet 0  . pantoprazole (PROTONIX) 40 MG tablet Take 1 tablet (40 mg total) by mouth 2 (two) times daily. (Patient not taking: Reported on 03/28/2017) 30 tablet 0  . polyethylene glycol powder (GLYCOLAX/MIRALAX) powder Take 17 g by mouth 2 (two) times  daily as needed. (Patient not taking: Reported on 12/07/2016) 3350 g 1  . ibuprofen (ADVIL,MOTRIN) 600 MG tablet Take 1 tablet (600 mg total) by mouth every 6 (six) hours as needed. (Patient not taking: Reported on 03/28/2017) 30 tablet 1   No facility-administered medications prior to visit.     ROS Review of Systems  Constitutional: Negative.   Eyes: Negative.   Respiratory: Negative.   Cardiovascular: Negative.   Gastrointestinal: Positive for abdominal pain (pelvic/lower).  Genitourinary: Positive for menstrual problem.      Objective:  BP 96/64 (BP Location: Left Arm, Patient Position: Sitting, Cuff Size: Normal)   Pulse 61   Temp 98.3 F (36.8 C) (Oral)   Resp 18   Ht 5\' 4"  (1.626 m)   Wt 177 lb 9.6 oz (80.6 kg)   SpO2 98%   BMI 30.48 kg/m   BP/Weight 03/28/2017 03/28/2017 02/22/2017  Systolic BP 96 107 117  Diastolic BP 64 56 64  Wt. (Lbs) 177.6 177.4 -  BMI 30.48 30.93 -     Physical Exam  Constitutional: She appears well-developed and well-nourished.  HENT:  Head: Normocephalic and atraumatic.  Right Ear: External ear normal.  Left Ear: External ear normal.  Nose: Nose normal.  Mouth/Throat: Oropharynx is clear and moist.  Eyes: Pupils are equal, round, and reactive to light. Conjunctivae are normal.  Neck: Normal range of motion. Neck supple. No JVD present.  Cardiovascular: Normal rate, regular rhythm, normal heart sounds and intact distal pulses.   Pulmonary/Chest: Effort normal and breath sounds normal. Right breast exhibits no mass, no nipple discharge, no skin change and no tenderness. Left breast exhibits no mass, no nipple discharge, no skin change and no  tenderness.  Abdominal: Soft. Bowel sounds are normal. There is tenderness (lower).  Genitourinary: Cervix exhibits no motion tenderness and no discharge. No vaginal discharge found.  Genitourinary Comments: Redness present around cervical os, small rounded nodule present at 11 o'clock position.     Lymphadenopathy:    She has no cervical adenopathy.  Skin: Skin is warm and dry.  Psychiatric: She has a normal mood and affect.  Nursing note and vitals reviewed.    Assessment & Plan:   Problem List Items Addressed This Visit    None    Visit Diagnoses    Well woman exam with routine gynecological exam    -  Primary   Relevant Orders   Cytology - PAP [place] (Completed)   History of thyroid disease       Relevant Orders   TSH   Thyroid Panel With TSH (Completed)   Screening for STDs (sexually transmitted diseases)       Relevant Orders   Cytology - PAP [place] (Completed)   HEP, RPR, HIV Panel (Completed)   HSV(herpes simplex vrs) 1+2 ab-IgG (Completed)   Lower abdominal pain       Relevant Orders   POCT urine pregnancy (Completed)   Urinalysis Dipstick (Completed)   Basic metabolic panel (Completed)   CBC with Differential (Completed)   Dysmenorrhea       Relevant Medications   ibuprofen (ADVIL,MOTRIN) 600 MG tablet   Other Relevant Orders   CBC with Differential (Completed)      Meds ordered this encounter  Medications  . ibuprofen (ADVIL,MOTRIN) 600 MG tablet    Sig: Take 1 tablet (600 mg total) by mouth every 8 (eight) hours as needed.    Dispense:  30 tablet    Refill:  0    Order Specific Question:   Supervising Provider    Answer:   Quentin Angst [[id number]]    Follow-up: Return in about 2 weeks (around 04/11/2017) for Physical Exam.   Lizbeth Bark FNP

## 2017-03-29 LAB — CYTOLOGY - PAP
DIAGNOSIS: NEGATIVE
HPV (WINDOPATH): NOT DETECTED

## 2017-03-30 LAB — CERVICOVAGINAL ANCILLARY ONLY
CHLAMYDIA, DNA PROBE: NEGATIVE
Candida vaginitis: NEGATIVE
Neisseria Gonorrhea: NEGATIVE
Trichomonas: NEGATIVE

## 2017-03-30 LAB — HEP, RPR, HIV PANEL
HEP B S AG: NEGATIVE
HIV Screen 4th Generation wRfx: NONREACTIVE
RPR Ser Ql: NONREACTIVE

## 2017-03-30 LAB — CBC WITH DIFFERENTIAL/PLATELET
BASOS: 0 %
Basophils Absolute: 0 10*3/uL (ref 0.0–0.2)
EOS (ABSOLUTE): 0.1 10*3/uL (ref 0.0–0.4)
EOS: 1 %
HEMATOCRIT: 37.3 % (ref 34.0–46.6)
HEMOGLOBIN: 12.1 g/dL (ref 11.1–15.9)
IMMATURE GRANS (ABS): 0 10*3/uL (ref 0.0–0.1)
IMMATURE GRANULOCYTES: 0 %
LYMPHS: 41 %
Lymphocytes Absolute: 3 10*3/uL (ref 0.7–3.1)
MCH: 29 pg (ref 26.6–33.0)
MCHC: 32.4 g/dL (ref 31.5–35.7)
MCV: 89 fL (ref 79–97)
MONOCYTES: 8 %
Monocytes Absolute: 0.6 10*3/uL (ref 0.1–0.9)
NEUTROS PCT: 50 %
Neutrophils Absolute: 3.6 10*3/uL (ref 1.4–7.0)
Platelets: 384 10*3/uL — ABNORMAL HIGH (ref 150–379)
RBC: 4.17 x10E6/uL (ref 3.77–5.28)
RDW: 15.2 % (ref 12.3–15.4)
WBC: 7.2 10*3/uL (ref 3.4–10.8)

## 2017-03-30 LAB — BASIC METABOLIC PANEL
BUN/Creatinine Ratio: 16 (ref 9–23)
BUN: 9 mg/dL (ref 6–20)
CALCIUM: 9.5 mg/dL (ref 8.7–10.2)
CO2: 21 mmol/L (ref 20–29)
CREATININE: 0.57 mg/dL (ref 0.57–1.00)
Chloride: 104 mmol/L (ref 96–106)
GFR calc Af Amer: 135 mL/min/{1.73_m2} (ref >59)
GFR, EST NON AFRICAN AMERICAN: 117 mL/min/{1.73_m2} (ref >59)
Glucose: 80 mg/dL (ref 65–99)
Potassium: 4.4 mmol/L (ref 3.5–5.2)
SODIUM: 140 mmol/L (ref 134–144)

## 2017-03-30 LAB — HSV(HERPES SIMPLEX VRS) I + II AB-IGG
HSV 1 Glycoprotein G Ab, IgG: 14.3 index — ABNORMAL HIGH (ref 0.00–0.90)
HSV 2 IgG, Type Spec: <0.91 index (ref 0.00–0.90)

## 2017-03-30 LAB — THYROID PANEL WITH TSH
FREE THYROXINE INDEX: 1.3 (ref 1.2–4.9)
T3 Uptake Ratio: 22 % — ABNORMAL LOW (ref 24–39)
T4 TOTAL: 6.1 ug/dL (ref 4.5–12.0)
TSH: 4.7 u[IU]/mL — AB (ref 0.450–4.500)

## 2017-03-31 ENCOUNTER — Encounter: Payer: Self-pay | Admitting: Family Medicine

## 2017-04-04 LAB — CERVICOVAGINAL ANCILLARY ONLY: HERPES (WINDOWPATH): NEGATIVE

## 2017-04-06 ENCOUNTER — Telehealth: Payer: Self-pay | Admitting: Internal Medicine

## 2017-04-06 NOTE — Telephone Encounter (Signed)
Pt. Came to facility stating that she received a call from the nurse.  Please f/u with pt.

## 2017-04-07 ENCOUNTER — Other Ambulatory Visit: Payer: Self-pay | Admitting: Family Medicine

## 2017-04-07 DIAGNOSIS — E785 Hyperlipidemia, unspecified: Secondary | ICD-10-CM

## 2017-04-07 DIAGNOSIS — E039 Hypothyroidism, unspecified: Secondary | ICD-10-CM | POA: Insufficient documentation

## 2017-04-07 DIAGNOSIS — E0590 Thyrotoxicosis, unspecified without thyrotoxic crisis or storm: Secondary | ICD-10-CM | POA: Insufficient documentation

## 2017-04-07 NOTE — Telephone Encounter (Signed)
-----   Message from Lizbeth Bark, FNP sent at 04/07/2017 10:27 AM EDT ----- HIV, Hepatitis B, and syphilis are all negative. Pap smear showed no lesions or malignancy. Herpes type 1 which is primarily responsible for cold sores is positive. When you have cold sores do not kiss anyone, share utensils, or have oral sex. Genital herpes, Gonorrhea, Chlamydia, BV, Yeast, and Trichomonas were all negative. Thyroid function is not well controlled. Levothyroxine reordered take daily. Recommend follow up in 8 weeks. Restart atorvastatin for history of high cholesterol. Recommend follow up in 8 weeks Labs that evaluated your blood cells, fluid and electrolyte balance are normal. No signs of anemia, infection, or inflammation present.

## 2017-04-07 NOTE — Telephone Encounter (Signed)
Resulted. Please let patient know that office will contact her with her lab results. If following up she can always call the office regarding lab results in the future.

## 2017-04-07 NOTE — Telephone Encounter (Signed)
Pt. Came to facility regarding her lab results checked patient chart, results are final but no notes from pcp please advice?

## 2017-04-07 NOTE — Telephone Encounter (Signed)
CMA call regarding lab results   Patient Verify DOB   Patient was aware and understood  

## 2017-04-08 MED ORDER — LEVOTHYROXINE SODIUM 25 MCG PO TABS: 25.0000 ug | ORAL_TABLET | Freq: Every day | ORAL | 2 refills | Status: DC

## 2017-04-08 MED ORDER — ATORVASTATIN CALCIUM 20 MG PO TABS: 20.0000 mg | ORAL_TABLET | Freq: Every day | ORAL | 2 refills | Status: DC

## 2017-04-08 MED FILL — LEVOTHYROXINE 25 MCG TABLET: 25 | 30 days supply | Qty: 30 | Fill #0

## 2017-04-08 MED FILL — ?ATORVASTATIN 20 MG TABLET: 20 | 30 days supply | Qty: 30 | Fill #0

## 2017-04-09 IMAGING — US US ABDOMEN COMPLETE
1 series · 13 of 25 positions shown · non-contrast
Comparison: Abdominal CT scan dated February 24, 2011

CLINICAL DATA: Diffuse abdominal pain intermittently for the past 3
years.

EXAM:
ABDOMEN ULTRASOUND COMPLETE

[Series 1: us abdomen complete · 0.22mm/px · 13 of 72 slices shown]
[im 1/72]
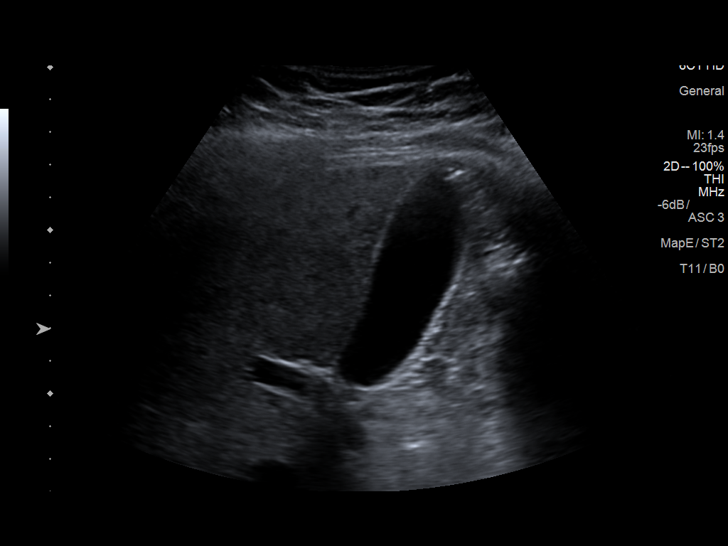
[im 6/72]
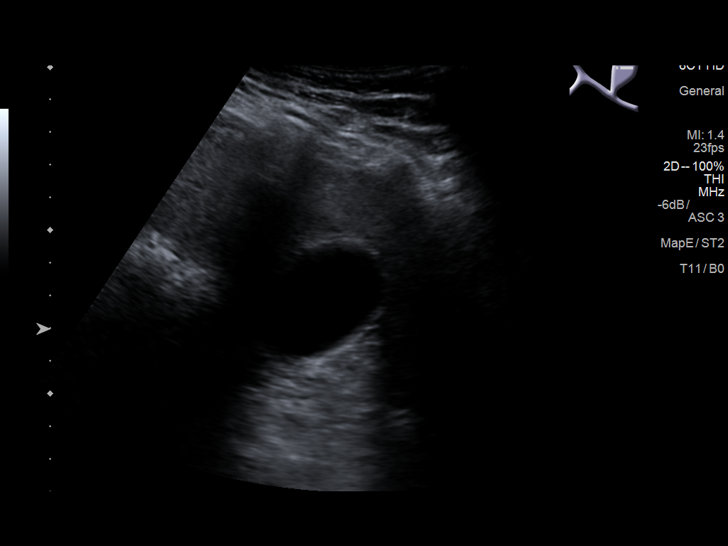
[im 12/72]
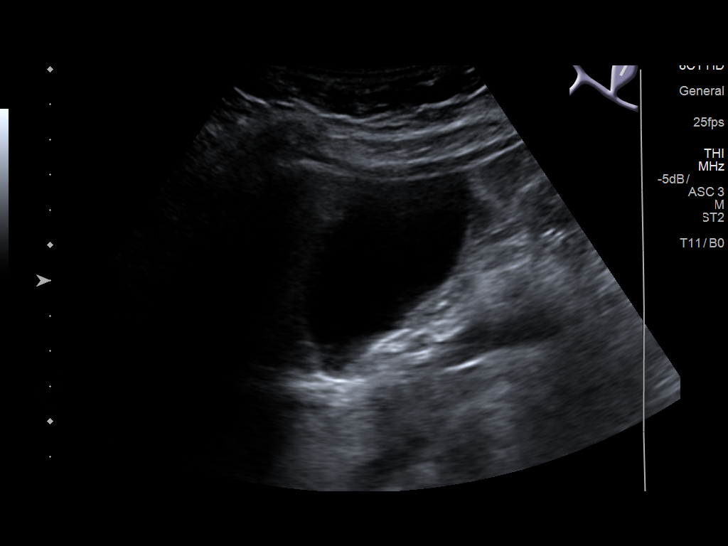
[im 18/72]
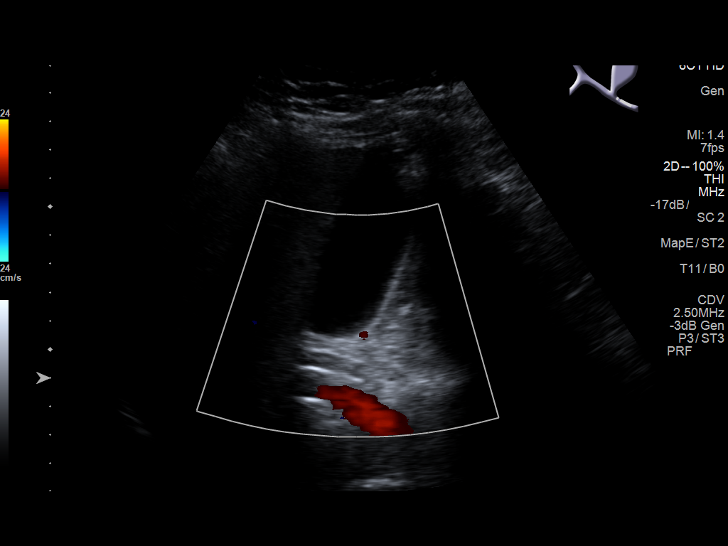
[im 24/72]
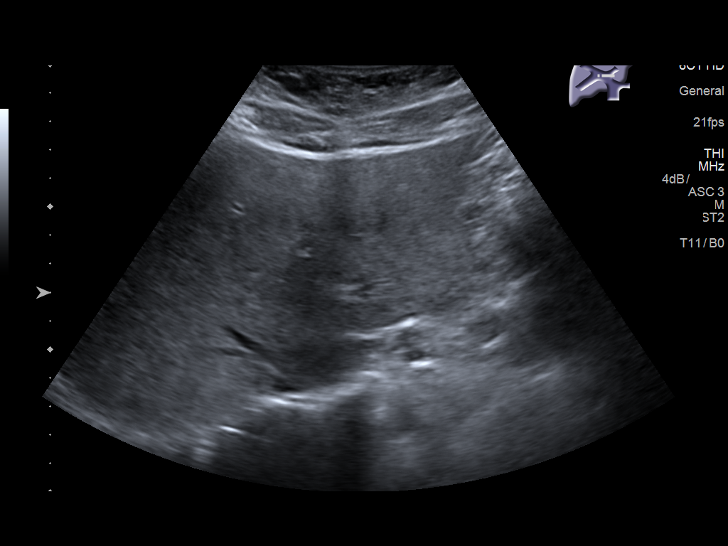
[im 30/72]
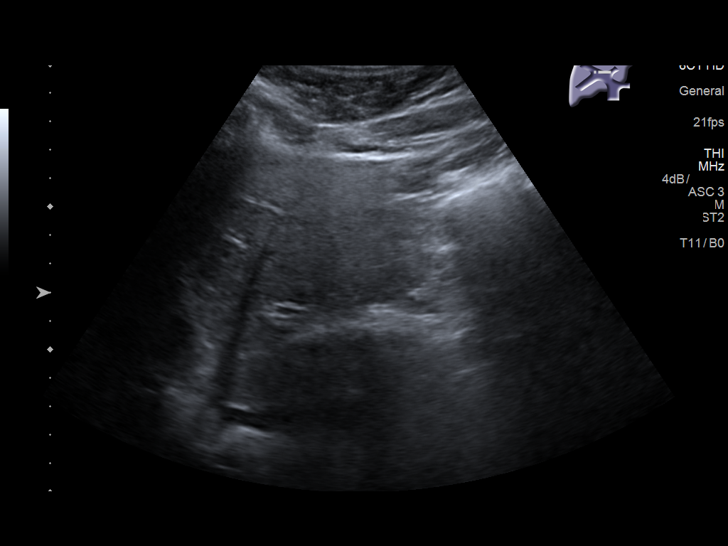
[im 36/72]
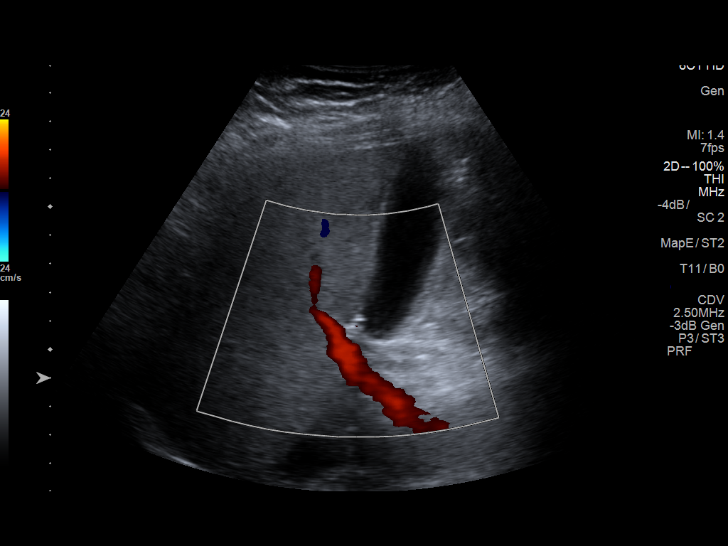
[im 42/72]
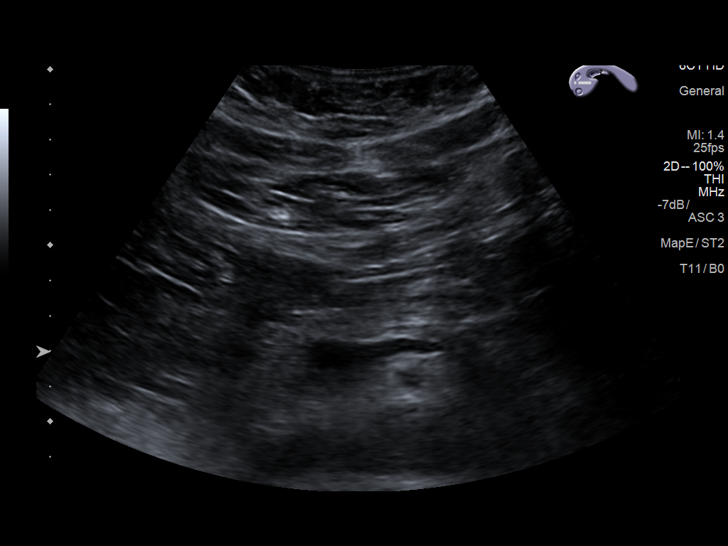
[im 48/72]
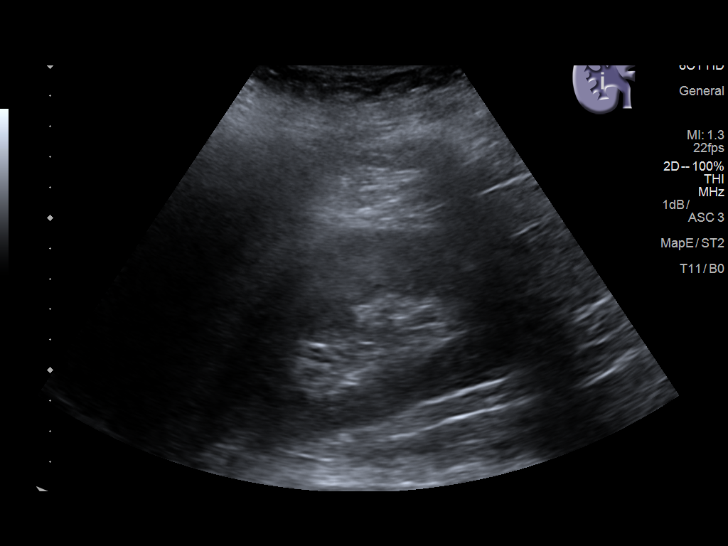
[im 54/72]
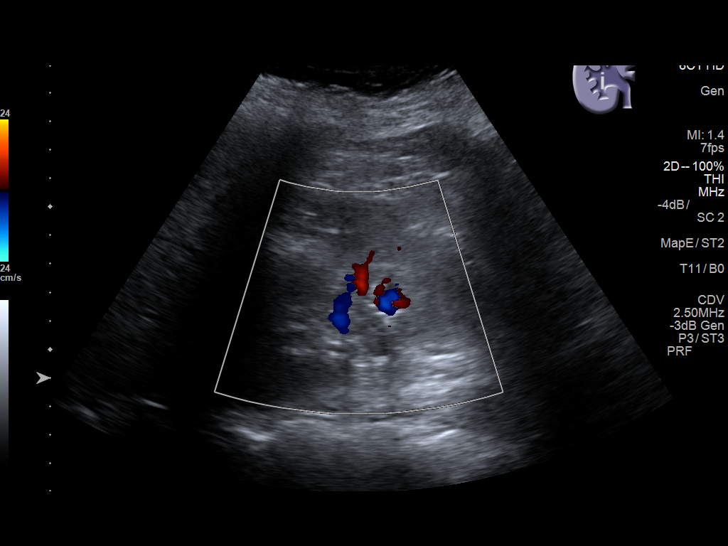
[im 60/72]
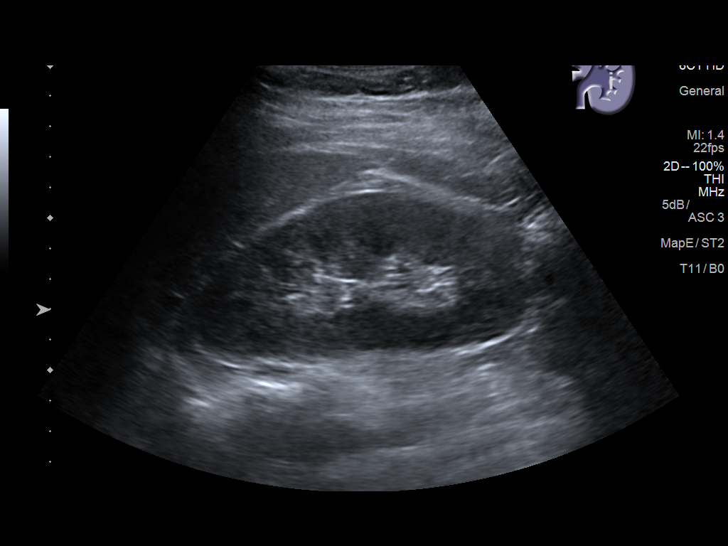
[im 66/72]
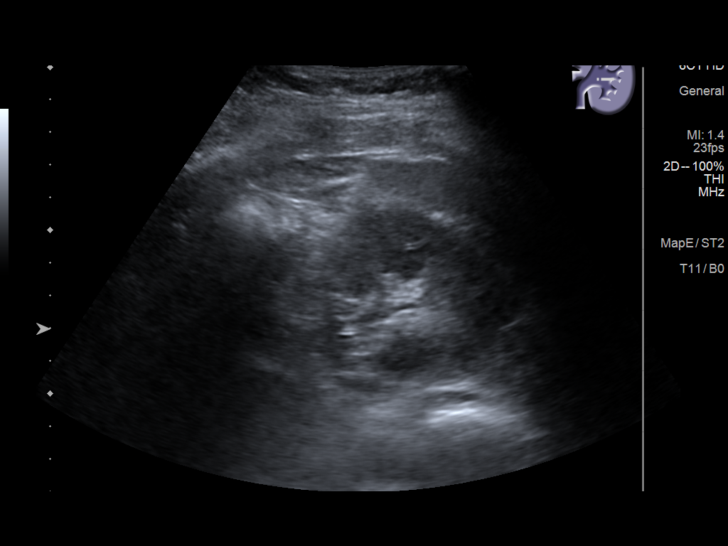
[im 72/72]
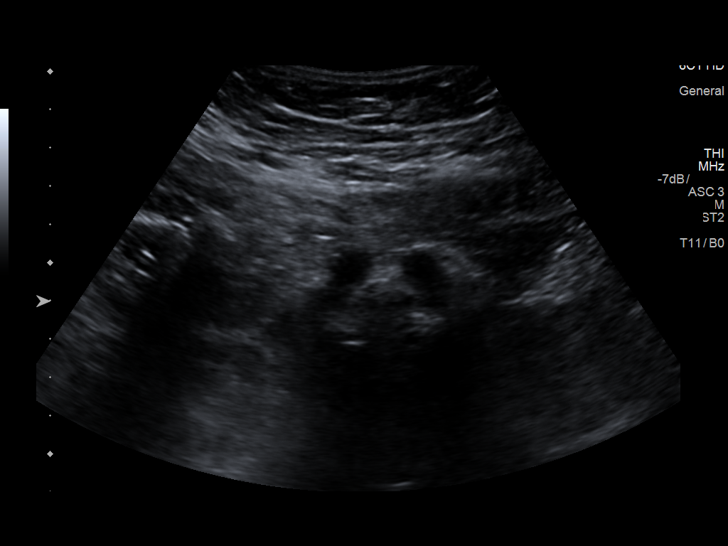

[13 of 25 positions shown; findings below may reference images not displayed]

FINDINGS: Gallbladder: No gallstones or wall thickening visualized. No
sonographic Murphy sign noted by sonographer.

Common bile duct: Diameter: 5.1 mm

Liver: The hepatic echotexture is increased and mildly
heterogeneous. There is no focal mass or ductal dilation. The
surface contour of the liver is smooth.

IVC: No abnormality visualized.

Pancreas: Visualized portion unremarkable.

Spleen: Size and appearance within normal limits.

Right Kidney: Length: 11.2 cm. Echogenicity within normal limits. No
mass or hydronephrosis visualized.

Left Kidney: Length: 11.3 cm. Echogenicity within normal limits. No
mass or hydronephrosis visualized.

Abdominal aorta: No aneurysm is observed.

Other findings: There is no ascites.
IMPRESSION: No gallstones or sonographic evidence of acute cholecystitis. If
there are clinical concerns of chronic cholecystitis, a nuclear
medicine hepatobiliary scan with gallbladder ejection fraction
determination may be useful.

Mildly increased hepatic echotexture consistent with fatty
infiltrative change.

## 2017-04-14 ENCOUNTER — Ambulatory Visit (HOSPITAL_COMMUNITY)
Admission: RE | Admit: 2017-04-14 | Discharge: 2017-04-14 | Disposition: A | Discharge status: Home / Self Care | Payer: Self-pay | Source: Ambulatory Visit | Attending: Family Medicine | Admitting: Family Medicine

## 2017-04-14 ENCOUNTER — Ambulatory Visit: Payer: Self-pay | Attending: Family Medicine | Admitting: Family Medicine

## 2017-04-14 ENCOUNTER — Other Ambulatory Visit: Payer: Self-pay

## 2017-04-14 ENCOUNTER — Encounter: Payer: Self-pay | Admitting: Family Medicine

## 2017-04-14 VITALS — BP 92/58 | HR 80 | Temp 98.0°F | Resp 18 | Ht 64.0 in | Wt 177.2 lb

## 2017-04-14 DIAGNOSIS — R079 Chest pain, unspecified: Secondary | ICD-10-CM | POA: Insufficient documentation

## 2017-04-14 DIAGNOSIS — J069 Acute upper respiratory infection, unspecified: Secondary | ICD-10-CM

## 2017-04-14 DIAGNOSIS — M545 Low back pain: Secondary | ICD-10-CM | POA: Insufficient documentation

## 2017-04-14 DIAGNOSIS — E0590 Thyrotoxicosis, unspecified without thyrotoxic crisis or storm: Secondary | ICD-10-CM | POA: Insufficient documentation

## 2017-04-14 DIAGNOSIS — J029 Acute pharyngitis, unspecified: Secondary | ICD-10-CM | POA: Insufficient documentation

## 2017-04-14 DIAGNOSIS — Z9889 Other specified postprocedural states: Secondary | ICD-10-CM | POA: Insufficient documentation

## 2017-04-14 DIAGNOSIS — M25512 Pain in left shoulder: Secondary | ICD-10-CM

## 2017-04-14 DIAGNOSIS — M5441 Lumbago with sciatica, right side: Secondary | ICD-10-CM | POA: Insufficient documentation

## 2017-04-14 DIAGNOSIS — M5442 Lumbago with sciatica, left side: Secondary | ICD-10-CM | POA: Insufficient documentation

## 2017-04-14 DIAGNOSIS — K5900 Constipation, unspecified: Secondary | ICD-10-CM | POA: Insufficient documentation

## 2017-04-14 DIAGNOSIS — Z79899 Other long term (current) drug therapy: Secondary | ICD-10-CM | POA: Insufficient documentation

## 2017-04-14 DIAGNOSIS — Z833 Family history of diabetes mellitus: Secondary | ICD-10-CM | POA: Insufficient documentation

## 2017-04-14 DIAGNOSIS — Z8249 Family history of ischemic heart disease and other diseases of the circulatory system: Secondary | ICD-10-CM | POA: Insufficient documentation

## 2017-04-14 DIAGNOSIS — Z791 Long term (current) use of non-steroidal anti-inflammatories (NSAID): Secondary | ICD-10-CM | POA: Insufficient documentation

## 2017-04-14 DIAGNOSIS — Z79891 Long term (current) use of opiate analgesic: Secondary | ICD-10-CM | POA: Insufficient documentation

## 2017-04-14 DIAGNOSIS — Z0000 Encounter for general adult medical examination without abnormal findings: Secondary | ICD-10-CM

## 2017-04-14 DIAGNOSIS — G8929 Other chronic pain: Secondary | ICD-10-CM | POA: Insufficient documentation

## 2017-04-14 DIAGNOSIS — B9789 Other viral agents as the cause of diseases classified elsewhere: Secondary | ICD-10-CM

## 2017-04-14 MED ORDER — GUAIFENESIN-DM 100-10 MG/5ML PO SYRP: 5.0000 mL | ORAL_SOLUTION | ORAL | 0 refills | Status: DC | PRN

## 2017-04-14 MED ORDER — NAPROXEN 500 MG PO TABS: ORAL_TABLET | ORAL | 0 refills | Status: DC

## 2017-04-14 MED ORDER — CYCLOBENZAPRINE HCL 10 MG PO TABS: 10.0000 mg | ORAL_TABLET | Freq: Three times a day (TID) | ORAL | 0 refills | Status: DC | PRN

## 2017-04-14 MED ORDER — POLYETHYLENE GLYCOL 3350 17 GM/SCOOP PO POWD: 17.0000 g | Freq: Every day | ORAL | 1 refills | Status: DC | PRN

## 2017-04-14 MED FILL — ROBAFEN-DM SYRUP: 100-10 | 5 days supply | Qty: 118 | Fill #0

## 2017-04-14 MED FILL — ?CYCLOBENZAPRINE 10 MG TABL: 10 | 13 days supply | Qty: 40 | Fill #0

## 2017-04-14 MED FILL — NAPROXEN 500 MG TABLET: 500 | 30 days supply | Qty: 60 | Fill #0

## 2017-04-14 MED FILL — POLYETHYLENE GLYCOL 3350: 14 days supply | Qty: 255 | Fill #0

## 2017-04-14 NOTE — Progress Notes (Signed)
Patient is here for physical   Patient complains muscle/back upper pain

## 2017-04-14 NOTE — Patient Instructions (Signed)
Dolor muscular en los adultos (Muscle Pain, Adult) El dolor muscular (mialgia) puede ser leve o intenso. La mayora de las veces, el dolor dura solo un corto perodo y desaparece sin tratamiento. Es normal sentir algo de Marketing executive despus de comenzar un programa de entrenamiento. Generalmente duelen aquellos msculos que no se utilizan con frecuencia. Puede haber muchas otras causas del dolor muscular, que incluyen las siguientes:  Uso excesivo del msculo o distensin muscular, en especial si la persona no est en buen estado fsico. Esta es la causa ms comn del dolor muscular.  Lesiones.  [place].  Virus, como el de la gripe.  Enfermedades infecciosas.  Una afeccin crnica que causa dolor de [place], fatiga y dolor muscular con la palpacin (fibromialgia).  Una afeccin, como el lupus, en la que el sistema del cuerpo encargado de combatir las enfermedades ataca a otros rganos (enfermedades autoinmunes o Development worker, community).  Determinados medicamentos, como los inhibidores de la ECA y las estatinas. Para diagnosticar la causa del Marketing executive, su Special educational needs teacher un examen fsico y le har preguntas sobre el dolor y cundo comenz. Si el dolor muscular no comenz hace mucho tiempo, el mdico puede esperar antes de hacer diversas pruebas. Si el dolor muscular comenz hace mucho tiempo, el mdico puede decidir que es ms conveniente hacer pruebas de inmediato. En algunos casos, se pueden realizar pruebas para descartar ciertas afecciones o enfermedades. El tratamiento del dolor muscular depende de la causa. El cuidado en el hogar a menudo es suficiente para Stage manager. El mdico tambin puede recetarle un medicamento antiinflamatorio. INSTRUCCIONES PARA EL CUIDADO EN EL HOGAR Actividad  Si el uso excesivo del msculo est provocando dolor muscular: ? [place] sus actividades hasta que el dolor desaparezca. ? Si usted no hace actividad fsica con frecuencia, los  ejercicios deben ser suaves y regulares. ? Realice precalentamiento antes de la actividad fsica. Elongue antes y despus de hacer [place]. Esto puede ayudar a International aid/development worker de que sufra dolor muscular.  No siga haciendo actividad fsica si el dolor es muy intenso. Este tipo de dolor podra indicar que se ha lesionado un msculo. Control del dolor y de las molestias  Si se lo indican, aplique hielo sobre el msculo dolorido: ? Field seismologist hielo en una bolsa plstica. ? Coloque una [employer] piel y la bolsa de hielo. ? Coloque el hielo durante 41minutos, 2 a 3veces por da.  Tambin puede alternar [employer] aplicar hielo y Presenter, broadcasting como se lo haya indicado el mdico. Para aplicar calor, use la fuente de calor que el mdico le recomiende, como una compresa de calor hmedo o una almohadilla trmica. ? Coloque una [employer] piel y la fuente de Freight forwarder. ? Aplique el calor durante 20 a 11minutos. ? Retire la fuente de calor si la piel se le pone de color rojo brillante. Esto es muy importante si no puede sentir el dolor, el calor ni el fro. Puede correr un riesgo mayor de sufrir quemaduras. Medicamentos  [employer] de venta libre y los recetados solamente como se lo haya indicado el mdico.  No conduzca ni use maquinaria pesada mientras toma analgsicos recetados. SOLICITE ATENCIN MDICA SI:  El Marketing executive empeora y los medicamentos no surten [place].  Tiene dolor muscular que dura ms de 3das.  Tiene una erupcin cutnea o fiebre junto con el dolor muscular.  Tiene dolor muscular despus de una picadura de garrapata.  Tiene dolor muscular mientras hace actividad fsica,  aunque est en buen estado fsico.  Tiene enrojecimiento, sensibilidad o hinchazn junto con el dolor muscular.  Tiene dolor muscular despus de comenzar un medicamento nuevo o de cambiar la dosis de un medicamento. SOLICITE ATENCIN MDICA DE INMEDIATO SI:  Tiene dificultad para  respirar.  Presenta dificultad para tragar.  Tiene dolor muscular junto con rigidez en el cuello, fiebre y vmitos.  Tiene debilidad muscular intensa o no puede mover una parte del cuerpo. Esta informacin no tiene Theme park manager el consejo del mdico. Asegrese de hacerle al mdico cualquier pregunta que tenga. Document Released: 11/09/2007 Document Revised: 11/24/2015 Document Reviewed: 12/23/2015 Elsevier Interactive Patient Education  2018 [employer]. Dolor de espalda en adultos (Back Pain, Adult) El dolor de espalda es muy frecuente en los adultos.La causa del dolor de espalda es rara vez peligrosa y Chief Technology Officer a menudo mejora con el [place].Es posible que se desconozca la causa de esta afeccin. Algunas causas comunes son las siguientes:  Distensin de los msculos o ligamentos que sostienen la columna vertebral.  Chiropractor (degeneracin) de los discos vertebrales.  Artritis.  Lesiones directas en la espalda. En [employer], el dolor de espalda es recurrente. Como rara vez es peligroso, las personas pueden aprender a Psychologist, clinical afeccin por s mismas. INSTRUCCIONES PARA EL CUIDADO EN EL HOGAR Controle su dolor de espalda a fin de Public house manager cambio. Las siguientes indicaciones ayudarn a Architectural technologist que pueda sentir:  Medical illustrator. Si permanece sentado o de pie en un mismo lugar durante mucho tiempo, se tensiona la espalda. No se siente, conduzca o permanezca de pie en un mismo lugar durante ms de 30 minutos seguidos. Realice caminatas cortas en superficies planas tan pronto como le sea posible.Trate de caminar un poco ms de Pharmacist, community.  Haga ejercicio regularmente como se lo haya indicado el mdico. El ejercicio ayuda a que su espalda se cure ms rpidamente. Tambin ayuda a prevenir futuras lesiones al [employer] fuertes y flexibles.  No permanezca en la cama.Si hace reposo ms de 1 a 2 das, puede demorar su  recuperacin.  Preste atencin a su cuerpo al inclinarse y levantarse. Las posiciones ms cmodas son las que ejercen menos tensin en la espalda en recuperacin. Siempre use tcnicas apropiadas para levantar objetos, como por ejemplo: ? Flexionar las rodillas. ? Mantener la carga cerca del cuerpo. ? No torcerse.  Encuentre una posicin cmoda para dormir. Use un colchn firme y recustese de costado con las rodillas ligeramente flexionadas. Si se recuesta [employer], coloque una almohada debajo de las rodillas.  Evite sentir ansiedad o estrs.El estrs aumenta la tensin muscular y puede empeorar el dolor de espalda.Es importante reconocer si se siente ansioso o estresado y aprender maneras de controlarlo, por ejemplo haciendo ejercicio.  Tome los medicamentos solamente como se lo haya indicado el mdico. Los medicamentos de venta libre para Engineer, materials y la inflamacin a menudo son los ms eficaces.El mdico puede recetarle relajantes musculares.Estos medicamentos ayudan a Primary school teacher de modo que pueda reanudar ms rpidamente sus actividades normales y el ejercicio saludable.  Aplique hielo sobre la zona lesionada. ? Ponga el hielo en una bolsa plstica. ? Coloque una [employer] piel y la bolsa de hielo. ? Deje el hielo durante , 2 a 3veces por da, durante los primeros 2 o 3das. Despus de eso, puede alternar el hielo y el calor para reducir Chief Technology Officer y los espasmos.  Mantenga un peso saludable. El exceso  de peso ejerce presin adicional sobre la espalda y hace que resulte difcil mantener una buena [place]postura. SOLICITE ATENCIN MDICA SI:  Siente un dolor que no se alivia con reposo o medicamentos.  Siente mucho dolor que se extiende a las piernas o los glteos.  El dolor no mejora en una semana.  Siente dolor por la noche.  Pierde peso.  Siente escalofros o fiebre.  SOLICITE ATENCIN MDICA DE INMEDIATO SI:  Tiene nuevos problemas para  controlar la vejiga o los intestinos.  Siente debilidad o adormecimiento inusuales en los brazos o en las piernas.  Siente nuseas o vmitos.  Siente dolor abdominal.  Siente que va a desmayarse.  Esta informacin no tiene Theme park managercomo fin reemplazar el consejo del mdico. Asegrese de hacerle al mdico cualquier pregunta que tenga. Document Released: 08/02/2005 Document Revised: 08/23/2014 Document Reviewed: 12/04/2013 Elsevier Interactive Patient Education  2017 [employer]

## 2017-04-14 NOTE — Progress Notes (Signed)
Subjective:   Patient ID: Courtney Edwards, female    DOB: 08/20/1975, 41 y.o.   MRN: [id number]  Chief Complaint  Patient presents with  . Annual Exam   HPI Courtney Edwards 41 y.o. female presents for a comprehensive physical exam. She complaints of arthralgias and myalgias for which has been present for 8 years. Pain is located in the left shoulder(s) and lumbar back, is described as aching, and is constant . Associated symptoms include: decreased range of motion.  The patient has tried nothing for pain relief.  Related to injury: no. Complaints low back problems. Symptoms have been present for 8 years and include pain in lumbar (aching in character and is moderate to severe in severity), and paresthesias. Initial inciting event: none. Alleviating factors identifiable by patient are none. Exacerbating factors identifiable by patient are bending forwards. Treatments so far initiated by patient: massage . She also c/o episode of chest pain. Reports last episode was last night and lasted for 5 minutes. The patient describes the pain as dull in nature, does not radiate. She denies any dyspnea, lower extremity edema, near-syncope, palpitations and syncope. Aggravating factors are none. Patient's cardiac risk factors are dyslipidemia.  Patient's risk factors for DVT/PE: none. Previous cardiac testing: none. Patient complains of  cough, sore throat and itching. Onset of symptoms was 4 days ago, unchanged since that time.Treatment to date: none. She reports constipation with bm's every 2 to 3 days. She denies any recent hemorrhoids or hematochezia.     Past Medical History:  Diagnosis Date  . Hyperthyroidism    no longer requires medications  . Low blood pressure   . Medical history non-contributory   . Mental disorder   . Post partum depression 2002    Past Surgical History:  Procedure Laterality Date  . LAPAROSCOPY N/A 02/09/2017   Procedure: LAPAROSCOPY DIAGNOSTIC;   Surgeon: Allie Bossierove, Myra C, MD;  Location: [facility];  Service: Gynecology;  Laterality: N/A;  . NO PAST SURGERIES      Family History  Problem Relation Age of Onset  . Hypertension Mother   . Diabetes Mother   . Heart disease Mother     Social History   Social History  . Marital status: Married    Spouse name: N/A  . Number of children: N/A  . Years of education: N/A   Occupational History  . Not on file.   Social History Main Topics  . Smoking status: Never Smoker  . Smokeless tobacco: Never Used  . Alcohol use No  . Drug use: No  . Sexual activity: No   Other Topics Concern  . Not on file   Social History Narrative  . No narrative on file    Outpatient Medications Prior to Visit  Medication Sig Dispense Refill  . atorvastatin (LIPITOR) 20 MG tablet Take 1 tablet (20 mg total) by mouth daily at 6 PM. 30 tablet 2  . levothyroxine (SYNTHROID) 25 MCG tablet Take 1 tablet (25 mcg total) by mouth daily before breakfast. 30 tablet 2  . ibuprofen (ADVIL,MOTRIN) 600 MG tablet Take 1 tablet (600 mg total) by mouth every 8 (eight) hours as needed. (Patient not taking: Reported on 04/14/2017) 30 tablet 0  . oxyCODONE-acetaminophen (PERCOCET/ROXICET) 5-325 MG tablet Take 1-2 tablets by mouth every 6 (six) hours as needed. (Patient not taking: Reported on 03/28/2017) 15 tablet 0  . pantoprazole (PROTONIX) 40 MG tablet Take 1 tablet (40 mg total) by mouth 2 (two) times daily. (Patient  not taking: Reported on 03/28/2017) 30 tablet 0  . polyethylene glycol powder (GLYCOLAX/MIRALAX) powder Take 17 g by mouth 2 (two) times daily as needed. (Patient not taking: Reported on 12/07/2016) 3350 g 1   No facility-administered medications prior to visit.     No Known Allergies  Review of Systems  Constitutional: Negative.   HENT: Positive for sore throat (itchy).   Eyes: Negative.   Respiratory: Positive for cough.   Cardiovascular: Positive for chest pain.  Gastrointestinal: Negative.     Genitourinary: Negative.   Musculoskeletal: Positive for myalgias.  Skin: Negative.   Neurological:       Parathesias  Psychiatric/Behavioral: Negative.        Objective:    Physical Exam  Constitutional: She is oriented to person, place, and time. She appears well-developed and well-nourished.  HENT:  Head: Normocephalic and atraumatic.  Right Ear: External ear normal.  Left Ear: External ear normal.  Nose: Nose normal.  Mouth/Throat: Oropharynx is clear and moist. No oropharyngeal exudate, posterior oropharyngeal edema or posterior oropharyngeal erythema.  Eyes: Pupils are equal, round, and reactive to light. Conjunctivae and EOM are normal.  Neck: Normal range of motion. Neck supple.  Cardiovascular: Normal rate, regular rhythm, normal heart sounds and intact distal pulses.   Pulmonary/Chest: Effort normal and breath sounds normal.  Abdominal: Soft. Bowel sounds are normal. There is no tenderness.  Musculoskeletal: Normal range of motion.       Left shoulder: She exhibits pain.       Lumbar back: She exhibits pain.  Neurological: She is alert and oriented to person, place, and time. She has normal reflexes.  Skin: Skin is warm and dry.  Psychiatric: She has a normal mood and affect.  Nursing note and vitals reviewed.   BP (!) 92/58 (BP Location: Left Arm, Patient Position: Sitting, Cuff Size: Normal)   Pulse 80   Temp 98 F (36.7 C) (Oral)   Resp 18   Ht 5\' 4"  (1.626 m)   Wt 177 lb 3.2 oz (80.4 kg)   SpO2 98%   BMI 30.42 kg/m  Wt Readings from Last 3 Encounters:  04/14/17 177 lb 3.2 oz (80.4 kg)  03/28/17 177 lb 9.6 oz (80.6 kg)  03/28/17 177 lb 6.4 oz (80.5 kg)    Immunization History  Administered Date(s) Administered  . Influenza,inj,Quad PF,6+ Mos 08/06/2013, 07/03/2016  . Tdap 08/23/2016     Lab Results  Component Value Date   TSH 4.700 (H) 03/28/2017   Lab Results  Component Value Date   WBC 7.9 04/14/2017   HGB 12.9 04/14/2017   HCT 37.1  04/14/2017   MCV 87 04/14/2017   PLT 374 04/14/2017   Lab Results  Component Value Date   NA 140 04/14/2017   K 4.5 04/14/2017   CO2 23 04/14/2017   GLUCOSE 92 04/14/2017   BUN 8 04/14/2017   CREATININE 0.66 04/14/2017   BILITOT 0.4 04/14/2017   ALKPHOS 83 04/14/2017   AST 65 (H) 04/14/2017   ALT 89 (H) 04/14/2017   PROT 7.6 04/14/2017   ALBUMIN 4.7 04/14/2017   CALCIUM 9.7 04/14/2017   ANIONGAP 9 02/22/2017   Lab Results  Component Value Date   CHOL 179 04/14/2017   CHOL 204 (H) 08/23/2016   CHOL 194 01/27/2015   Lab Results  Component Value Date   HDL 41 04/14/2017   HDL 37 (L) 08/23/2016   HDL 38 (L) 01/27/2015   Lab Results  Component Value Date   LDLCALC  84 04/14/2017   LDLCALC 113 (H) 08/23/2016   LDLCALC 122 (H) 01/27/2015   Lab Results  Component Value Date   TRIG 269 (H) 04/14/2017   TRIG 269 (H) 08/23/2016   TRIG 169 (H) 01/27/2015   Lab Results  Component Value Date   CHOLHDL 4.4 04/14/2017   CHOLHDL 5.5 (H) 08/23/2016   CHOLHDL 5.1 01/27/2015   Lab Results  Component Value Date   HGBA1C 5.50 01/27/2015       Assessment & Plan:   Problem List Items Addressed This Visit      Other   LOW BACK PAIN, MILD   Relevant Medications   naproxen (NAPROSYN) 500 MG tablet   cyclobenzaprine (FLEXERIL) 10 MG tablet   Other Relevant Orders   DG Lumbar Spine Complete (Completed)   Chronic left shoulder pain   Relevant Medications   naproxen (NAPROSYN) 500 MG tablet   cyclobenzaprine (FLEXERIL) 10 MG tablet   Other Relevant Orders   DG Shoulder Left (Completed)    Other Visit Diagnoses    Annual physical exam    -  Primary   Relevant Orders   CMP and Liver (Completed)   Lipid Panel (Completed)   Vitamin D 1,25 dihydroxy (Completed)   CBC with Differential (Completed)   Viral URI with cough       Relevant Medications   guaiFENesin-dextromethorphan (ROBITUSSIN DM) 100-10 MG/5ML syrup   Chest pain, unspecified type       Relevant Orders    CMP and Liver (Completed)   CBC with Differential (Completed)   EKG 12-Lead   Constipation, unspecified constipation type       Relevant Medications   polyethylene glycol powder (GLYCOLAX/MIRALAX) powder      Meds ordered this encounter  Medications  . guaiFENesin-dextromethorphan (ROBITUSSIN DM) 100-10 MG/5ML syrup    Sig: Take 5 mLs by mouth every 4 (four) hours as needed for cough.    Dispense:  118 mL    Refill:  0    Order Specific Question:   Supervising Provider    Answer:   Quentin Angst [id number]  . naproxen (NAPROSYN) 500 MG tablet    Sig: TAKE ONE TABLET BY  MOUTH TWICE A DAY WITH MEALS FOR 10 DAYS. THEN TAKE ONE TABLET BY MOUTH ONCE A DAY AS NEEDED WITH A MEAL.    Dispense:  60 tablet    Refill:  0    Order Specific Question:   Supervising Provider    Answer:   Quentin Angst [id number]  . cyclobenzaprine (FLEXERIL) 10 MG tablet    Sig: Take 1 tablet (10 mg total) by mouth 3 (three) times daily as needed for muscle spasms.    Dispense:  40 tablet    Refill:  0    Order Specific Question:   Supervising Provider    Answer:   Quentin Angst [id number]  . polyethylene glycol powder (GLYCOLAX/MIRALAX) powder    Sig: Take 17 g by mouth daily as needed.    Dispense:  3350 g    Refill:  1    Order Specific Question:   Supervising Provider    Answer:   Quentin Angst [id number]     Follow up: Return if symptoms worsen or fail to improve.   Arrie Senate, FNP   `

## 2017-04-15 ENCOUNTER — Telehealth: Payer: Self-pay | Admitting: Family Medicine

## 2017-04-15 NOTE — Telephone Encounter (Signed)
Pt called to request the lab result na dhas question a about a lab done on January, please follow up

## 2017-04-18 LAB — LIPID PANEL
CHOL/HDL RATIO: 4.4 ratio (ref 0.0–4.4)
Cholesterol, Total: 179 mg/dL (ref 100–199)
HDL: 41 mg/dL (ref >39)
LDL Calculated: 84 mg/dL (ref 0–99)
Triglycerides: 269 mg/dL — ABNORMAL HIGH (ref 0–149)
VLDL CHOLESTEROL CAL: 54 mg/dL — AB (ref 5–40)

## 2017-04-18 LAB — CMP AND LIVER
ALT: 89 IU/L — AB (ref 0–32)
AST: 65 IU/L — ABNORMAL HIGH (ref 0–40)
Albumin: 4.7 g/dL (ref 3.5–5.5)
Alkaline Phosphatase: 83 IU/L (ref 39–117)
BILIRUBIN TOTAL: 0.4 mg/dL (ref 0.0–1.2)
BUN: 8 mg/dL (ref 6–20)
Bilirubin, Direct: 0.13 mg/dL (ref 0.00–0.40)
CO2: 23 mmol/L (ref 20–29)
Calcium: 9.7 mg/dL (ref 8.7–10.2)
Chloride: 103 mmol/L (ref 96–106)
Creatinine, Ser: 0.66 mg/dL (ref 0.57–1.00)
GFR, EST AFRICAN AMERICAN: 129 mL/min/{1.73_m2} (ref >59)
GFR, EST NON AFRICAN AMERICAN: 112 mL/min/{1.73_m2} (ref >59)
GLUCOSE: 92 mg/dL (ref 65–99)
Potassium: 4.5 mmol/L (ref 3.5–5.2)
Sodium: 140 mmol/L (ref 134–144)
Total Protein: 7.6 g/dL (ref 6.0–8.5)

## 2017-04-18 LAB — VITAMIN D 1,25 DIHYDROXY
VITAMIN D 1, 25 (OH) TOTAL: 37 pg/mL
Vitamin D2 1, 25 (OH)2: <10 pg/mL
Vitamin D3 1, 25 (OH)2: 36 pg/mL

## 2017-04-18 LAB — CBC WITH DIFFERENTIAL/PLATELET
BASOS: 0 %
Basophils Absolute: 0 10*3/uL (ref 0.0–0.2)
EOS (ABSOLUTE): 0.2 10*3/uL (ref 0.0–0.4)
EOS: 2 %
HEMATOCRIT: 37.1 % (ref 34.0–46.6)
Hemoglobin: 12.9 g/dL (ref 11.1–15.9)
Immature Grans (Abs): 0 10*3/uL (ref 0.0–0.1)
Immature Granulocytes: 0 %
LYMPHS ABS: 2.6 10*3/uL (ref 0.7–3.1)
Lymphs: 33 %
MCH: 30.1 pg (ref 26.6–33.0)
MCHC: 34.8 g/dL (ref 31.5–35.7)
MCV: 87 fL (ref 79–97)
MONOS ABS: 0.6 10*3/uL (ref 0.1–0.9)
Monocytes: 7 %
Neutrophils Absolute: 4.5 10*3/uL (ref 1.4–7.0)
Neutrophils: 58 %
Platelets: 374 10*3/uL (ref 150–379)
RBC: 4.29 x10E6/uL (ref 3.77–5.28)
RDW: 14.7 % (ref 12.3–15.4)
WBC: 7.9 10*3/uL (ref 3.4–10.8)

## 2017-04-19 ENCOUNTER — Other Ambulatory Visit: Payer: Self-pay | Admitting: Family Medicine

## 2017-04-19 ENCOUNTER — Ambulatory Visit: Payer: Self-pay | Attending: Family Medicine

## 2017-04-19 DIAGNOSIS — R7989 Other specified abnormal findings of blood chemistry: Secondary | ICD-10-CM

## 2017-04-19 DIAGNOSIS — R945 Abnormal results of liver function studies: Principal | ICD-10-CM

## 2017-04-19 DIAGNOSIS — M549 Dorsalgia, unspecified: Secondary | ICD-10-CM

## 2017-04-19 DIAGNOSIS — M25512 Pain in left shoulder: Secondary | ICD-10-CM

## 2017-04-19 DIAGNOSIS — E785 Hyperlipidemia, unspecified: Secondary | ICD-10-CM

## 2017-04-19 DIAGNOSIS — G8929 Other chronic pain: Secondary | ICD-10-CM | POA: Insufficient documentation

## 2017-04-19 MED ORDER — ATORVASTATIN CALCIUM 40 MG PO TABS: 40.0000 mg | ORAL_TABLET | Freq: Every day | ORAL | 6 refills | Status: DC

## 2017-04-19 MED FILL — ?ATORVASTATIN 40MG TABLET: 40 | 30 days supply | Qty: 30 | Fill #0

## 2017-04-19 NOTE — Telephone Encounter (Signed)
CMA call regarding lab results   Patient verify DOB  Patient was aware and understood  

## 2017-04-19 NOTE — Telephone Encounter (Signed)
-----   Message from Lizbeth BarkMandesia R Hairston, FNP sent at 04/19/2017  8:26 AM EDT ----- Herby AbrahamXray of spine shows no evidence of fracture or misalignment of the spine, or compression of spinal column. Left shoulder xray shows no evidence of fracture or dislocation.  You will be referred to orthopedics.  Labs that evaluated your blood cells, fluid and electrolyte balance are normal. No signs of anemia, infection, or inflammation present. Lipid levels were elevated. This can increase your risk of heart disease overtime. Your dose of atorvastatin will be increased. Start eating a diet low in saturated fat. Limit your intake of fried foods, red meats, and whole milk. Increased activity. Recommend follow up in 3 months. Liver function elevated. Recommend screening for hepatitis.  Avoid alcohol and Tylenol. Begin eating a diet lower in fat. Decrease your intake of fried foods and whole milk.  Vitamin d levels are normal. Vitamin D helps to keep bones strong.

## 2017-04-19 NOTE — Progress Notes (Signed)
Patient here for lab visit only 

## 2017-04-20 LAB — HEPATITIS C ANTIBODY

## 2017-04-20 LAB — HEPATITIS B SURFACE ANTIGEN: Hepatitis B Surface Ag: NEGATIVE

## 2017-04-21 ENCOUNTER — Telehealth: Payer: Self-pay

## 2017-04-21 NOTE — Telephone Encounter (Signed)
-----   Message from Lizbeth BarkMandesia R Hairston, FNP sent at 04/21/2017  9:48 AM EDT ----- Hepatitis B and C are negative.  Start eating a diet lower in saturated fat. Limit your intake of fried foods, red meats, and whole milk. Increase activity.  Avoid alcohol and Tylenol. Begin eating a diet lower in fat. Decrease your intake of fried foods and whole milk.  Will recheck liver function in 3 months to see if levels have improved

## 2017-04-21 NOTE — Telephone Encounter (Signed)
CMA call regarding lab results   Patient verify DOB  Patient was aware and understood  

## 2017-05-17 MED FILL — ?LEVOTHYROXINE 25 MCG TABLE: 25 | 30 days supply | Qty: 30 | Fill #1

## 2017-05-17 MED FILL — ?ATORVASTATIN 20 MG TABLET: 20 | 30 days supply | Qty: 30 | Fill #1

## 2017-05-19 MED FILL — ?ATORVASTATIN 40MG TABLET: 40 | 30 days supply | Qty: 30 | Fill #1

## 2017-06-28 ENCOUNTER — Telehealth: Payer: Self-pay | Admitting: Family Medicine

## 2017-06-28 MED FILL — ?ATORVASTATIN 40MG TABLET: 40 | 30 days supply | Qty: 30 | Fill #2

## 2017-06-28 MED FILL — ?LEVOTHYROXINE 25 MCG TABLE: 25 | 30 days supply | Qty: 30 | Fill #2

## 2017-06-28 NOTE — Telephone Encounter (Signed)
Pt came to the office to request refill for levothyroxine (SYNTHROID) 25 MCG tablet  Please follow up

## 2017-06-29 ENCOUNTER — Other Ambulatory Visit: Payer: Self-pay | Admitting: Family Medicine

## 2017-06-29 DIAGNOSIS — E039 Hypothyroidism, unspecified: Secondary | ICD-10-CM

## 2017-06-29 MED ORDER — LEVOTHYROXINE SODIUM 25 MCG PO TABS: 25.0000 ug | ORAL_TABLET | Freq: Every day | ORAL | 0 refills | Status: DC

## 2017-06-29 NOTE — Telephone Encounter (Signed)
Medication refilled. No additional refills without office visit.

## 2017-06-29 NOTE — Telephone Encounter (Signed)
CMA call regarding letting patient know about medication refill & she needs to make an appt for additional refills   Patient was aware and understood

## 2017-07-13 ENCOUNTER — Ambulatory Visit: Payer: Self-pay | Attending: Family Medicine

## 2017-07-20 ENCOUNTER — Ambulatory Visit: Payer: Self-pay

## 2017-08-01 ENCOUNTER — Encounter: Payer: Self-pay | Admitting: Family Medicine

## 2017-08-01 ENCOUNTER — Ambulatory Visit: Payer: Self-pay | Attending: Family Medicine | Admitting: Family Medicine

## 2017-08-01 VITALS — BP 108/70 | HR 67 | Temp 98.6°F | Resp 18 | Ht 65.0 in | Wt 178.0 lb

## 2017-08-01 DIAGNOSIS — Z79899 Other long term (current) drug therapy: Secondary | ICD-10-CM | POA: Insufficient documentation

## 2017-08-01 DIAGNOSIS — N889 Noninflammatory disorder of cervix uteri, unspecified: Secondary | ICD-10-CM | POA: Insufficient documentation

## 2017-08-01 DIAGNOSIS — N819 Female genital prolapse, unspecified: Secondary | ICD-10-CM | POA: Insufficient documentation

## 2017-08-01 DIAGNOSIS — N941 Unspecified dyspareunia: Secondary | ICD-10-CM | POA: Insufficient documentation

## 2017-08-01 DIAGNOSIS — E039 Hypothyroidism, unspecified: Secondary | ICD-10-CM | POA: Insufficient documentation

## 2017-08-01 NOTE — Progress Notes (Signed)
Patient is here for f/up   Patient complains vaginal issue   Patient stated that she feels like something is hanging from her vagina

## 2017-08-01 NOTE — Progress Notes (Signed)
Subjective:  Patient ID: Courtney Edwards, female    DOB: 12/19/1975  Age: 41 y.o. MRN: [id number]  CC: Hypothyroidism   HPI Courtney Edwards presents for vaginal complaint. Interpreter services used [facility] [phone]. Symptoms include vaginal pain and sensation of foreign body. Pain 4/10. Onset of symptoms was 3 years ago, since the birth of her last child. She states " It feels like something is falling out". She reports symptoms are worsened by house work. Associated symptoms include dyspareunia. She denies any dysuria. History of hypothyroidism:.    Outpatient Medications Prior to Visit  Medication Sig Dispense Refill  . atorvastatin (LIPITOR) 40 MG tablet Take 1 tablet (40 mg total) by mouth daily at 6 PM. 30 tablet 6  . cyclobenzaprine (FLEXERIL) 10 MG tablet Take 1 tablet (10 mg total) by mouth 3 (three) times daily as needed for muscle spasms. 40 tablet 0  . guaiFENesin-dextromethorphan (ROBITUSSIN DM) 100-10 MG/5ML syrup Take 5 mLs by mouth every 4 (four) hours as needed for cough. 118 mL 0  . ibuprofen (ADVIL,MOTRIN) 600 MG tablet Take 1 tablet (600 mg total) by mouth every 8 (eight) hours as needed. (Patient not taking: Reported on 04/14/2017) 30 tablet 0  . levothyroxine (SYNTHROID) 25 MCG tablet Take 1 tablet (25 mcg total) daily before breakfast by mouth. 40 tablet 0  . naproxen (NAPROSYN) 500 MG tablet TAKE ONE TABLET BY  MOUTH TWICE A DAY WITH MEALS FOR 10 DAYS. THEN TAKE ONE TABLET BY MOUTH ONCE A DAY AS NEEDED WITH A MEAL. 60 tablet 0  . oxyCODONE-acetaminophen (PERCOCET/ROXICET) 5-325 MG tablet Take 1-2 tablets by mouth every 6 (six) hours as needed. (Patient not taking: Reported on 03/28/2017) 15 tablet 0  . pantoprazole (PROTONIX) 40 MG tablet Take 1 tablet (40 mg total) by mouth 2 (two) times daily. (Patient not taking: Reported on 03/28/2017) 30 tablet 0  . polyethylene glycol powder (GLYCOLAX/MIRALAX) powder Take 17 g by mouth daily as needed.  3350 g 1   No facility-administered medications prior to visit.     ROS Review of Systems  Constitutional: Negative.   Respiratory: Negative.   Cardiovascular: Negative.   Gastrointestinal: Negative.   Genitourinary: Positive for dyspareunia and vaginal pain. Negative for dysuria.  Skin: Negative.   Psychiatric/Behavioral: Negative.    Objective:  BP 108/70 (BP Location: Left Arm, Patient Position: Sitting, Cuff Size: Normal)   Pulse 67   Temp 98.6 F (37 C) (Oral)   Resp 18   Ht 5\' 5"  (1.651 m)   Wt 178 lb (80.7 kg)   LMP 07/07/2017   SpO2 99%   BMI 29.62 kg/m   BP/Weight 08/01/2017 04/14/2017 03/28/2017  Systolic BP 108 92 96  Diastolic BP 70 58 64  Wt. (Lbs) 178 177.2 177.6  BMI 29.62 30.42 30.48     Physical Exam  Constitutional: She appears well-developed and well-nourished.  Neck: No thyromegaly present.  Cardiovascular: Normal rate, regular rhythm, normal heart sounds and intact distal pulses.  Pulmonary/Chest: Effort normal and breath sounds normal.  Abdominal: Soft. Bowel sounds are normal. There is no tenderness.  Genitourinary:  Genitourinary Comments: Cervix appears erythremic with nodule present at 11 o'clock.  Prolapse of pelvic organ palpated with pelvic exam.  Skin: Skin is warm and dry.  Psychiatric: She has a normal mood and affect.  Nursing note and vitals reviewed.    Assessment & Plan:   1. Female genital prolapse, unspecified type - Ambulatory referral to Gynecology  2. Dyspareunia in  female - Ambulatory referral to Gynecology  3. Abnormality of cervix - Ambulatory referral to Gynecology - Cytology - [facility]  4. Hypothyroidism, unspecified type  - TSH       Follow-up: Return As needed.   Lizbeth BarkMandesia R Hairston FNP

## 2017-08-01 NOTE — Patient Instructions (Signed)
Prolapso de los rganos de la pelvis  (Pelvic Organ Prolapse)  El prolapso de los rganos de la pelvis es el estiramiento, la dilatacin o la cada de los rganos de la pelvis a una posicin anormal. Esto sucede cuando los msculos y tejidos que rodean y sostienen las estructuras plvicas se estiran o se debilitan. El prolapso de los rganos de la pelvis puede involucrar los siguientes rganos:   Vagina (prolapso vaginal).   tero (prolapso uterino).   Vejiga (cistocele).   Recto (rectocele).   Intestinos (enterocele).  Cuando estn comprometidos rganos que no son la vagina, estos a menudo se protruyen hacia adentro o hacia afuera de la vagina, dependiendo de la gravedad del prolapso.  CAUSAS  Algunas causas de esta enfermedad incluyen lo siguiente:   Embarazo, trabajo de parto y parto.   Tos prolongada (crnica).   Estreimiento crnico.   Obesidad.   Ciruga de pelvis anterior.   Envejecimiento. Durante y despus de la menopausia, una disminucin en la produccin de estrgenos puede debilitar los msculos y los ligamentos plvicos.   Levantar sistemticamente objetos pesados de ms de 50libras (23kg).   Acumulacin de lquido en el abdomen debido a ciertas enfermedades y a otras afecciones.  SNTOMAS  Los sntomas de esta afeccin incluyen lo siguiente:   Prdida del control de la vejiga al toser, estornudar, hacer un esfuerzo y hacer ejercicio (incontinencia urinaria de esfuerzo). Esto puede empeorar inmediatamente despus del nacimiento y mejorar gradualmente con el tiempo.   Sensacin de presin en la pelvis o la vagina. Esta presin puede aumentar al toser o defecar.   Una protuberancia que sobresale desde la apertura de la vagina o contra la pared vaginal. Si el tero sobresale a travs de la apertura de la vagina y roza la ropa, tambin puede padecer irritacin, lceras, infeccin, dolor y sangrado.   Mayor esfuerzo para defecar u orinar.   Dolor en la parte inferior de la  espalda.   Dolor, molestias o desinters en las relaciones sexuales.   Infecciones reiteradas en la vejiga (infecciones en las vas urinarias).   Dificultad o incapacidad para colocar un tampn o un aplicador.  En algunas personas, esta afeccin no produce sntomas.  DIAGNSTICO  El mdico puede realizarle un examen interno y externo de la vagina y el recto. Durante el examen, puede pedirle que tosa y que haga un esfuerzo mientras est acostada, sentada y de pie. El mdico determinar si se requieren ms estudios, como las pruebas de la funcin de la vejiga.  TRATAMIENTO  En la mayora de los casos, esta afeccin debe tratarse solo si produce sntomas. No existe ningn tratamiento que garantice que el prolapso se corregir o que alivie los sntomas por completo. El tratamiento puede incluir lo siguiente:   Cambios de estilo de vida tales como:  ? Evitar las bebidas que contengan cafena.  ? Aumentar la ingesta de alimentos con alto contenido de fibra. Esto puede ayudar a disminuir el estreimiento y el esfuerzo al defecar.  ? Vaciar la vejiga en momentos programados (terapia de entrenamiento de la vejiga). Esto puede ayudar a reducir o evitar la incontinencia urinaria.  ? Bajar de peso si tiene sobrepeso o es obeso.   Estrgeno. Si el prolapso es leve, el estrgeno puede ayudar al aumentar la fuerza y el tono de los msculos del piso plvico.   Ejercicios de Kegel. Estos ejercicios pueden ayudar en los casos leves de prolapso al estirar y tensar los msculos del piso plvico.   Colocacin   de un pesario. Un pesario es un dispositivo blando y flexible que el mdico coloca en la vagina para ayudar a sostener las paredes vaginales y mantener los rganos de la pelvis en su lugar.   Ciruga. Esta es a menudo la nica forma de tratamiento de los casos graves de prolapso. Existen diferentes tipos de ciruga disponibles.  INSTRUCCIONES PARA EL CUIDADO EN EL HOGAR   Use una toalla higinica o un producto absorbente  si tiene incontinencia urinaria.   Evite levantar objetos pesados y realizar esfuerzos mientras se ejercita o trabaja. No contenga la respiracin cuando levanta pesas y realiza ejercicios de intensidad leve a moderada. Limite sus actividades segn las indicaciones del mdico.   Tome los medicamentos solamente como se lo haya indicado el mdico.   Practique los ejercicios de Kegel como se lo haya indicado el mdico.   Si tiene un pesario, selo como se lo haya indicado el mdico.  SOLICITE ATENCIN MDICA SI:   Sus sntomas interfieren con sus actividades diarias o su vida sexual.   Necesita medicamentos para aliviar las molestias.   Observa sangrado vaginal no relacionado con su menstruacin.   Tiene fiebre.   Siente dolor o tiene una hemorragia al orinar.   Observa sangrado al defecar.   Pierde orina durante las relaciones sexuales.   Sufre estreimiento crnico.   Tiene un pesario colocado y se le cae.   Tiene secrecin vaginal con olor ftido.   Tiene dolor o clicos inusuales en la parte inferior del abdomen.  Esta informacin no tiene como fin reemplazar el consejo del mdico. Asegrese de hacerle al mdico cualquier pregunta que tenga.  Document Released: 02/27/2014 Document Revised: 02/27/2014 Document Reviewed: 10/15/2013  Elsevier Interactive Patient Education  2018 Elsevier Inc[employer]

## 2017-08-02 LAB — TSH: TSH: 3.87 u[IU]/mL (ref 0.450–4.500)

## 2017-08-02 LAB — CYTOLOGY - PAP
DIAGNOSIS: NEGATIVE
HPV (WINDOPATH): NOT DETECTED

## 2017-08-05 ENCOUNTER — Other Ambulatory Visit: Payer: Self-pay | Admitting: Family Medicine

## 2017-08-05 DIAGNOSIS — E039 Hypothyroidism, unspecified: Secondary | ICD-10-CM

## 2017-08-05 MED ORDER — LEVOTHYROXINE SODIUM 25 MCG PO TABS: 25.0000 ug | ORAL_TABLET | Freq: Every day | ORAL | 5 refills | Status: DC

## 2017-08-05 MED FILL — ?LEVOTHYROXINE 25 MCG TABLE: 25 | 30 days supply | Qty: 30 | Fill #0

## 2017-08-12 MED FILL — ATORVASTATIN 40 MG TABLET: 40 | 30 days supply | Qty: 30 | Fill #3

## 2017-08-29 ENCOUNTER — Telehealth: Payer: Self-pay | Admitting: Family Medicine

## 2017-09-08 MED FILL — ?LEVOTHYROXINE 25 MCG TABLE: 25 | 30 days supply | Qty: 30 | Fill #1

## 2017-10-06 ENCOUNTER — Encounter: Payer: Self-pay | Admitting: Obstetrics & Gynecology

## 2017-10-10 MED FILL — ?ATORVASTATIN 40MG TAB: 40 | 30 days supply | Qty: 30 | Fill #4

## 2017-10-10 MED FILL — ?LEVOTHYROXINE 25 MCG TABLE: 25 | 30 days supply | Qty: 30 | Fill #2

## 2017-10-11 ENCOUNTER — Encounter: Payer: Self-pay | Admitting: Obstetrics & Gynecology

## 2017-10-11 ENCOUNTER — Ambulatory Visit (INDEPENDENT_AMBULATORY_CARE_PROVIDER_SITE_OTHER): Payer: Self-pay | Admitting: Obstetrics & Gynecology

## 2017-10-11 VITALS — BP 102/70 | HR 61 | Wt 177.0 lb

## 2017-10-11 DIAGNOSIS — R102 Pelvic and perineal pain: Secondary | ICD-10-CM

## 2017-10-11 LAB — POCT URINALYSIS DIP (DEVICE)
Bilirubin Urine: NEGATIVE
Glucose, UA: NEGATIVE mg/dL
KETONES UR: NEGATIVE mg/dL
LEUKOCYTES UA: NEGATIVE
NITRITE: NEGATIVE
PH: 6 (ref 5.0–8.0)
Protein, ur: NEGATIVE mg/dL
Specific Gravity, Urine: 1.015 (ref 1.005–1.030)
Urobilinogen, UA: 0.2 mg/dL (ref 0.0–1.0)

## 2017-10-11 MED ORDER — NORGESTIM-ETH ESTRAD TRIPHASIC 0.18/0.215/0.25 MG-25 MCG PO TABS: 1.0000 | ORAL_TABLET | Freq: Every day | ORAL | 11 refills | Status: DC

## 2017-10-11 MED FILL — NORG-EE 0.18-0.215-0.25/0.0: 0.18/0.215/ | 28 days supply | Qty: 28 | Fill #0

## 2017-10-11 NOTE — Patient Instructions (Signed)
Dolor plvico en la mujer (Pelvic Pain, Female) El dolor plvico se percibe en la parte baja del abdomen, por debajo del ombligo y entre las caderas. El dolor puede comenzar de forma repentina (agudo), reaparecer (recurrente) o durar mucho tiempo (crnico). Se considera que el dolor plvico que dura ms de seis meses es crnico. El dolor plvico puede tener numerosas causas. A veces, la causa no se conoce. CUIDADOS EN EL HOGAR  Tome los medicamentos de venta libre y los recetados solamente como se lo haya indicado el mdico.  Haga reposo como se lo haya indicado el mdico.  No tenga relaciones sexuales si le causan dolor.  Lleve un registro del dolor plvico. Escriba los siguientes datos: ? Cundo comenz el dolor. ? La ubicacin del dolor. ? Qu cosas parecen aliviar o intensificar el dolor, como los alimentos o la menstruacin. ? Los sntomas que tiene junto con el dolor.  Concurra a todas las visitas de control como se lo haya indicado el mdico. Esto es importante. SOLICITE AYUDA SI:  Los medicamentos no le alivian el dolor.  El dolor reaparece.  Aparecen nuevos sntomas.  Tiene secrecin o sangrado vaginal atpico.  Tiene fiebre o siente escalofros.  Tiene dificultad para defecar (estreimiento).  Observa sangre en la orina o en la materia fecal.  La orina tiene mal olor.  Se siente mareada o dbil. SOLICITE AYUDA DE INMEDIATO SI:  Siente un dolor repentino que es muy intenso.  El dolor contina empeorando.  El dolor es muy intenso y, adems, tiene alguno de los siguientes sntomas: ? Fiebre. ? Ganas de vomitar (nuseas). ? Vmitos. ? Suda mucho.  Se desmaya (pierde el conocimiento). Esta informacin no tiene como fin reemplazar el consejo del mdico. Asegrese de hacerle al mdico cualquier pregunta que tenga. Document Released: 02/01/2012 Document Revised: 08/23/2014 Document Reviewed: 05/23/2015 Elsevier Interactive Patient Education  2018 Elsevier  Inc.  

## 2017-10-11 NOTE — Progress Notes (Signed)
Patient ID: Courtney Edwards, female   DOB: 1975-11-08, 42 y.o.   MRN: [id number]  Chief Complaint  Patient presents with  . Vaginal Pain  pelvic pain  HPI Courtney Edwards is a 42 y.o. female.  [id number] Patient's last menstrual period was 09/07/2017 (approximate). Courtney Edwards husband is sterilized. Courtney Edwards continues to have dysmenorrhea and dyspareunia, low back pain HPI  Past Medical History:  Diagnosis Date  . Hyperthyroidism    no longer requires medications  . Low blood pressure   . Medical history non-contributory   . Mental disorder   . Post partum depression 2002    Past Surgical History:  Procedure Laterality Date  . LAPAROSCOPY N/A 02/09/2017   Procedure: LAPAROSCOPY DIAGNOSTIC;  Surgeon: Allie Bossier, MD;  Location: [facility];  Service: Gynecology;  Laterality: N/A;  . NO PAST SURGERIES      Family History  Problem Relation Age of Onset  . Hypertension Mother   . Diabetes Mother   . Heart disease Mother     Social History Social History   Tobacco Use  . Smoking status: Never Smoker  . Smokeless tobacco: Never Used  Substance Use Topics  . Alcohol use: No  . Drug use: No    No Known Allergies  Current Outpatient Medications  Medication Sig Dispense Refill  . atorvastatin (LIPITOR) 40 MG tablet Take 1 tablet (40 mg total) by mouth daily at 6 PM. 30 tablet 6  . levothyroxine (SYNTHROID) 25 MCG tablet Take 1 tablet (25 mcg total) by mouth daily before breakfast. 30 tablet 5  . Norgestimate-Ethinyl Estradiol Triphasic 0.18/0.215/0.25 MG-25 MCG tab Take 1 tablet by mouth daily. 1 Package 11   No current facility-administered medications for this visit.     Review of Systems Review of Systems  Constitutional: Negative.   Gastrointestinal: Positive for constipation.  Genitourinary: Positive for dyspareunia, menstrual problem (pain) and pelvic pain. Negative for difficulty urinating, frequency, vaginal discharge and vaginal pain.    Blood  pressure 102/70, pulse 61, weight 177 lb (80.3 kg), last menstrual period 09/07/2017.  Physical Exam Physical Exam  Constitutional: Courtney Edwards is oriented to person, place, and time. Courtney Edwards appears well-developed. No distress.  Cardiovascular: Normal rate.  Pulmonary/Chest: Effort normal.  Genitourinary: Vagina normal. No vaginal discharge found.  Genitourinary Comments: Cervix parous, uterus retroverted and tender with no adnexal mass  Neurological: Courtney Edwards is alert and oriented to person, place, and time.  Psychiatric: Courtney Edwards has a normal mood and affect. Courtney Edwards behavior is normal.  Vitals reviewed.   Data Reviewed  CLINICAL DATA:  RIGHT pelvic pain in a female for 3 years  EXAM: TRANSABDOMINAL AND TRANSVAGINAL ULTRASOUND OF PELVIS  TECHNIQUE: Both transabdominal and transvaginal ultrasound examinations of the pelvis were performed. Transabdominal technique was performed for global imaging of the pelvis including uterus, ovaries, adnexal regions, and pelvic cul-de-sac. It was necessary to proceed with endovaginal exam following the transabdominal exam to visualize the endometrium.  COMPARISON:  05/27/2014  FINDINGS: Uterus  Measurements: 8.5 x 4.7 x 6.2 cm. Retroverted. No focal uterine mass.  Endometrium  Thickness: 18 mm thick.  No focal abnormalities or endometrial fluid  Right ovary  Measurements: 4.2 x 1.6 x 2.6 cm. Normal morphology without mass. Internal blood flow present on color Doppler imaging.  Left ovary  Measurements: 3.9 x 2.2 x 1.9 cm. Normal morphology without mass. Internal blood flow present on color Doppler imaging.  Other findings  Trace free pelvic fluid.  IMPRESSION: Retroverted uterus with a nonspecific  endometrial complex up to 18 mm thick.  Otherwise negative exam.   Electronically Signed   By: Ulyses SouthwardMark  Boles M.D.   On: 08/26/2016 14:17 pap normal Assessment    Pelvic pain Negative laparoscopy    Plan    Try cycle  control with Tri Cyclen. RTC 3 months        Scheryl DarterJames Cordarius Benning 10/11/2017, 10:16 AM

## 2017-10-11 NOTE — Progress Notes (Signed)
Pt stated since had the last baby 4 years ago having pain in the vagina especially when having sex or menstrual period but everyday pain. Also, when urinating is a little painful.

## 2017-10-24 ENCOUNTER — Ambulatory Visit: Payer: Self-pay

## 2017-10-24 ENCOUNTER — Ambulatory Visit: Payer: Self-pay | Attending: Family Medicine

## 2017-11-07 MED FILL — ?ATORVASTATIN 40MG TAB: 40 | 30 days supply | Qty: 30 | Fill #5

## 2017-11-07 MED FILL — NORG-EE 0.18-0.215-0.25/0.0: 0.18/0.215/ | 28 days supply | Qty: 28 | Fill #1

## 2017-11-07 MED FILL — ?LEVOTHYROXINE 25 MCG TABLE: 25 | 30 days supply | Qty: 30 | Fill #3

## 2017-12-01 MED FILL — NORG-EE 0.18-0.215-0.25/0.0: 0.18/0.215/ | 28 days supply | Qty: 28 | Fill #2

## 2017-12-05 MED FILL — ?LEVOTHYROXINE 25 MCG TABLE: 25 | 30 days supply | Qty: 30 | Fill #4

## 2017-12-05 MED FILL — ?ATORVASTATIN 40MG TABLET: 40 | 30 days supply | Qty: 30 | Fill #6

## 2018-01-13 ENCOUNTER — Ambulatory Visit: Payer: Self-pay

## 2018-01-20 ENCOUNTER — Encounter: Payer: Self-pay | Admitting: Nurse Practitioner

## 2018-01-20 ENCOUNTER — Ambulatory Visit: Payer: Self-pay | Attending: Nurse Practitioner | Admitting: Nurse Practitioner

## 2018-01-20 VITALS — BP 93/61 | HR 58 | Temp 98.2°F | Resp 18 | Ht 63.0 in | Wt 182.0 lb

## 2018-01-20 DIAGNOSIS — N941 Unspecified dyspareunia: Secondary | ICD-10-CM | POA: Insufficient documentation

## 2018-01-20 DIAGNOSIS — R1030 Lower abdominal pain, unspecified: Secondary | ICD-10-CM | POA: Insufficient documentation

## 2018-01-20 DIAGNOSIS — Z79890 Hormone replacement therapy (postmenopausal): Secondary | ICD-10-CM | POA: Insufficient documentation

## 2018-01-20 DIAGNOSIS — Z8639 Personal history of other endocrine, nutritional and metabolic disease: Secondary | ICD-10-CM

## 2018-01-20 DIAGNOSIS — Z79899 Other long term (current) drug therapy: Secondary | ICD-10-CM | POA: Insufficient documentation

## 2018-01-20 DIAGNOSIS — E039 Hypothyroidism, unspecified: Secondary | ICD-10-CM | POA: Insufficient documentation

## 2018-01-20 DIAGNOSIS — E782 Mixed hyperlipidemia: Secondary | ICD-10-CM | POA: Insufficient documentation

## 2018-01-20 LAB — POCT URINALYSIS DIPSTICK
BILIRUBIN UA: NEGATIVE
Blood, UA: NEGATIVE
Glucose, UA: NEGATIVE
KETONES UA: NEGATIVE
Leukocytes, UA: NEGATIVE
Nitrite, UA: NEGATIVE
PH UA: 5.5 (ref 5.0–8.0)
Protein, UA: NEGATIVE
SPEC GRAV UA: 1.02 (ref 1.010–1.025)
UROBILINOGEN UA: 0.2 U/dL

## 2018-01-20 MED ORDER — LIDOCAINE 5 % EX OINT: 1.0000 "application " | TOPICAL_OINTMENT | CUTANEOUS | 1 refills | Status: DC | PRN

## 2018-01-20 MED FILL — LIDOCAINE 5 % OINT: 5 | 15 days supply | Qty: 35 | Fill #0

## 2018-01-20 NOTE — Patient Instructions (Signed)
Hipotiroidismo  Hypothyroidism  El hipotiroidismo es un trastorno de la tiroides. una glndula grande ubicada en la parte anterior e inferior del cuello. La tiroides libera hormonas que controlan el funcionamiento del organismo. En los casos de hipotiroidismo, la glndula no produce la cantidad suficiente de estas hormonas.  Cules son las causas?  Las causas del hipotiroidismo pueden incluir lo siguiente:   Infecciones virales.   Embarazo.   Un ataque del sistema de defensa (sistema inmunitario) a la tiroides.   Ciertos medicamentos.   Defectos congnitos.   Radioterapias anteriores en la cabeza o el cuello.   Tratamiento previo con yodo radioactivo.   Extirpacin quirrgica previa de una parte o de toda la tiroides.   Problemas con la glndula ubicada en el centro del cerebro (hipfisis).    Cules son los signos o los sntomas?  Los signos y los sntomas de hipotiroidismo pueden ser los siguientes:   Sensacin de falta de energa (letargo).   Incapacidad para tolerar el fro.   Aumento de peso que no puede explicarse por un cambio en la dieta o en los hbitos de ejercicio fsico.   Piel seca.   Pelo grueso.   Irregularidades menstruales.   Ralentizacin de los procesos de pensamiento.   Estreimiento.   Tristeza o depresin.    Cmo se diagnostica?  El mdico puede diagnosticar el hipotiroidismo con anlisis de sangre y ecografas.  Cmo se trata?  El hipotiroidismo se trata con medicamentos que reemplazan las hormonas que el cuerpo no produce. Despus de comenzar el tratamiento, pueden pasar varias semanas hasta la desaparicin de los sntomas.  Siga estas instrucciones en su casa:   Tome los medicamentos solamente como se lo haya indicado el mdico.   Si empieza a tomar medicamentos nuevos, infrmele al mdico.   Concurra a todas las visitas de control como se lo haya indicado el mdico. Esto es importante. A medida que la enfermedad mejora, es posible que haya que modificar las dosis.  Tendr que hacerse anlisis de sangre peridicamente, de modo que el mdico pueda controlar la enfermedad.  Comunquese con un mdico si:   Los sntomas no mejoran con el tratamiento.   Est tomando medicamentos sustitutivos de la tiroides y:  ? Suda en exceso.  ? Siente temblores.  ? Est ansioso.  ? Baja de peso rpidamente.  ? No puede tolerar el calor.  ? Tiene cambios emocionales.  ? Tiene diarrea.  ? Se siente dbil.  Solicite ayuda de inmediato si:   Siente dolor en el pecho.   Tiene latidos cardacos irregulares o siente dolor en el pecho.   Nota que la frecuencia cardaca est acelerada.  Esta informacin no tiene como fin reemplazar el consejo del mdico. Asegrese de hacerle al mdico cualquier pregunta que tenga.  Document Released: 08/02/2005 Document Revised: 11/08/2016 Document Reviewed: 12/18/2013  Elsevier Interactive Patient Education  2018 Elsevier Inc[employer]

## 2018-01-20 NOTE — Progress Notes (Signed)
Assessment & Plan:  Courtney Edwards was seen today for establish care.  Diagnoses and all orders for this visit:  Lower abdominal pain -     Urinalysis Dipstick -     CBC -     Basic metabolic panel -     Ambulatory referral to physical therapy for pelvic floor training   History of hyperthyroidism -     TSH  Mixed hyperlipidemia -     Lipid panel INSTRUCTIONS: Work on a low fat, heart healthy diet and participate in regular aerobic exercise program by working out at least 150 minutes per week. No fried foods. No junk foods, sodas, sugary drinks, unhealthy snacking, alcohol or smoking.    Dyspareunia in female -     lidocaine (XYLOCAINE) 5 % ointment; Apply 1 application topically as needed.    Patient has been counseled on age-appropriate routine health concerns for screening and prevention. These are reviewed and up-to-date. Referrals have been placed accordingly. Immunizations are up-to-date or declined.    Subjective:   Chief Complaint  Patient presents with  . Establish Care   HPI Courtney Edwards Courtney Edwards 42 y.o. female presents to office today to establish care. She has a history of hypothyroidism and chronic lower pelvic pain which is being evaluated by Gynecology at this time. She continues to endorse lower pelvic pressure.  She was prescribed OCP at her most recent appointment with GYN 09-2017 which she reports have provided some relief of her dysmenorrhea. VRI was used to communicate directly with patient for the entire encounter including providing detailed patient instructions.    Vaginal Issues Chronic and persistent. Feels as if something is sitting in her vagina. Also endorses dyspareunia and bleeding with sex.  10/11/2017 showed retroverted uterine endometrial complex up to 18 mm.  She has also had a negative endoscopy.  Physical exam her PCP 08/01/2017 revealed that she pelvic organ palpable with pelvic exam.  She saw her gynecologist in the past who suggested  pelvic PT and will refer her time.   Hypothyroidism Patient presents for evaluation of thyroid function. Symptoms consist of fatigue, weight gain. Symptoms have present for several weeks. The symptoms are mild.  The problem has been unchanged.  Previous thyroid studies include TSH, T3 uptake and total T4. The hypothyroidism is due to hypothyroidism. She recently ran out of her synthroid 2 weeks ago.   Hyperlipidemia Patient presents for follow up to hyperlipidemia.  She is medication compliant. She is not diet compliant and denies poor exercise tolerance and skin xanthelasma or statin intolerance including myalgias.  Lab Results  Component Value Date   CHOL 179 04/14/2017   Lab Results  Component Value Date   HDL 41 04/14/2017   Lab Results  Component Value Date   LDLCALC 84 04/14/2017   Lab Results  Component Value Date   TRIG 269 (H) 04/14/2017   Lab Results  Component Value Date   CHOLHDL 4.4 04/14/2017   Review of Systems  Constitutional: Positive for malaise/fatigue. Negative for fever and weight loss.       Weight gain  HENT: Negative.  Negative for nosebleeds.   Eyes: Negative.  Negative for blurred vision, double vision and photophobia.  Respiratory: Negative.  Negative for cough and shortness of breath.   Cardiovascular: Negative.  Negative for chest pain, palpitations and leg swelling.  Gastrointestinal: Negative.  Negative for heartburn, nausea and vomiting.  Genitourinary: Negative for dysuria, flank pain, frequency, hematuria and urgency.  SEE HPI  Musculoskeletal: Negative.  Negative for myalgias.  Neurological: Negative.  Negative for dizziness, focal weakness, seizures and headaches.  Psychiatric/Behavioral: Negative.  Negative for suicidal ideas.    Past Medical History:  Diagnosis Date  . Hypothyroidism   . Low blood pressure   . Medical history non-contributory   . Mental disorder   . Post partum depression 2002    Past Surgical History:    Procedure Laterality Date  . LAPAROSCOPY N/A 02/09/2017   Procedure: LAPAROSCOPY DIAGNOSTIC;  Surgeon: Allie Bossierove, Myra C, MD;  Location: [facility];  Service: Gynecology;  Laterality: N/A;  . NO PAST SURGERIES      Family History  Problem Relation Age of Onset  . Hypertension Mother   . Diabetes Mother   . Heart disease Mother     Social History Reviewed with no changes to be made today.   Outpatient Medications Prior to Visit  Medication Sig Dispense Refill  . atorvastatin (LIPITOR) 40 MG tablet Take 1 tablet (40 mg total) by mouth daily at 6 PM. 30 tablet 6  . levothyroxine (SYNTHROID) 25 MCG tablet Take 1 tablet (25 mcg total) by mouth daily before breakfast. 30 tablet 5  . Norgestimate-Ethinyl Estradiol Triphasic 0.18/0.215/0.25 MG-25 MCG tab Take 1 tablet by mouth daily. 1 Package 11   No facility-administered medications prior to visit.     No Known Allergies     Objective:    BP 93/61 (BP Location: Left Arm, Patient Position: Sitting, Cuff Size: Normal)   Pulse (!) 58   Temp 98.2 F (36.8 C) (Oral)   Resp 18   Ht 5\' 3"  (1.6 m)   Wt 182 lb (82.6 kg)   LMP 01/14/2018   SpO2 98%   BMI 32.24 kg/m  Wt Readings from Last 3 Encounters:  01/20/18 182 lb (82.6 kg)  10/11/17 177 lb (80.3 kg)  08/01/17 178 lb (80.7 kg)    Physical Exam  Constitutional: She is oriented to person, place, and time. She appears well-developed and well-nourished. She is cooperative.  HENT:  Head: Normocephalic and atraumatic.  Eyes: EOM are normal.  Neck: Normal range of motion.  Cardiovascular: Normal rate, regular rhythm, normal heart sounds and intact distal pulses. Exam reveals no gallop and no friction rub.  No murmur heard. Pulmonary/Chest: Effort normal and breath sounds normal. No tachypnea. No respiratory distress. She has no decreased breath sounds. She has no wheezes. She has no rhonchi. She has no rales. She exhibits no tenderness.  Abdominal: Bowel sounds are normal. She exhibits no  distension and no mass. There is no rebound and no guarding.  Musculoskeletal: Normal range of motion. She exhibits no edema.  Neurological: She is alert and oriented to person, place, and time. Coordination normal.  Skin: Skin is warm and dry.  Psychiatric: She has a normal mood and affect. Her behavior is normal. Judgment and thought content normal.  Nursing note and vitals reviewed.      Patient has been counseled extensively about nutrition and exercise as well as the importance of adherence with medications and regular follow-up. The patient was given clear instructions to go to ER or return to medical center if symptoms don't improve, worsen or new problems develop. The patient verbalized understanding.   Follow-up: Return in about 6 months (around 07/22/2018) for hypothyroidism, hyperlipidemia.   Claiborne RiggZelda W Josclyn Rosales, FNP-BC [facility] [place], [place] [phone]   01/20/2018, 5:32 PM

## 2018-01-21 LAB — BASIC METABOLIC PANEL
BUN / CREAT RATIO: 10 (ref 9–23)
BUN: 6 mg/dL (ref 6–20)
CO2: 21 mmol/L (ref 20–29)
Calcium: 9.3 mg/dL (ref 8.7–10.2)
Chloride: 104 mmol/L (ref 96–106)
Creatinine, Ser: 0.6 mg/dL (ref 0.57–1.00)
GFR calc Af Amer: 133 mL/min/{1.73_m2} (ref >59)
GFR, EST NON AFRICAN AMERICAN: 115 mL/min/{1.73_m2} (ref >59)
GLUCOSE: 87 mg/dL (ref 65–99)
POTASSIUM: 4.5 mmol/L (ref 3.5–5.2)
SODIUM: 140 mmol/L (ref 134–144)

## 2018-01-21 LAB — CBC
HEMOGLOBIN: 12.7 g/dL (ref 11.1–15.9)
Hematocrit: 38.4 % (ref 34.0–46.6)
MCH: 29.5 pg (ref 26.6–33.0)
MCHC: 33.1 g/dL (ref 31.5–35.7)
MCV: 89 fL (ref 79–97)
Platelets: 335 10*3/uL (ref 150–450)
RBC: 4.31 x10E6/uL (ref 3.77–5.28)
RDW: 14.3 % (ref 12.3–15.4)
WBC: 7.7 10*3/uL (ref 3.4–10.8)

## 2018-01-21 LAB — LIPID PANEL
CHOLESTEROL TOTAL: 204 mg/dL — AB (ref 100–199)
Chol/HDL Ratio: 7.6 ratio — ABNORMAL HIGH (ref 0.0–4.4)
HDL: 27 mg/dL — ABNORMAL LOW (ref >39)
Triglycerides: 725 mg/dL (ref 0–149)

## 2018-01-21 LAB — TSH: TSH: 2.79 u[IU]/mL (ref 0.450–4.500)

## 2018-01-23 ENCOUNTER — Ambulatory Visit: Payer: Self-pay | Attending: Nurse Practitioner

## 2018-01-24 ENCOUNTER — Other Ambulatory Visit: Payer: Self-pay | Admitting: Nurse Practitioner

## 2018-01-24 ENCOUNTER — Telehealth: Payer: Self-pay

## 2018-01-24 DIAGNOSIS — E785 Hyperlipidemia, unspecified: Secondary | ICD-10-CM

## 2018-01-24 MED ORDER — ATORVASTATIN CALCIUM 40 MG PO TABS: 40.0000 mg | ORAL_TABLET | Freq: Every day | ORAL | 6 refills | Status: DC

## 2018-01-24 MED ORDER — OMEGA-3-ACID ETHYL ESTERS 1 G PO CAPS: 2.0000 g | ORAL_CAPSULE | Freq: Two times a day (BID) | ORAL | 3 refills | Status: DC

## 2018-01-24 NOTE — Telephone Encounter (Signed)
CMA spoke to patient to inform on lab result and advising.  Pt. Was inform to make a fasting lab appt for her cholesterol, pt understood and will make one in 6 weeks.  Pt. Stated she ran our of Lipitor, Rx [facility].   Spanish interpreter Lars MageJuan [phone] assist with the call.

## 2018-01-24 NOTE — Telephone Encounter (Signed)
-----   Message from Claiborne RiggZelda W Fleming, NP sent at 01/24/2018 12:56 PM EDT ----- Triglyceride levels are extremely elevated. I have sent in another medication to help lower your levels. You should also be taking your atorvastatin or lipitor as well. You will need to make a fasting lab appointment for your lipid panel to be checked again in 6 weeks. This means nothing to eat or drink for 8 hours before your lab appointment.

## 2018-02-06 ENCOUNTER — Telehealth: Payer: Self-pay | Admitting: Nurse Practitioner

## 2018-02-06 NOTE — Telephone Encounter (Signed)
Patient called requesting to speak financial advisor regarding questions about her cafa letter. Please fu at your earliest convenience .

## 2018-02-10 MED FILL — ?ATORVASTATIN 40MG TABLET: 40 | 30 days supply | Qty: 30 | Fill #0

## 2018-02-10 MED FILL — OMEGA-3 ETHYL ESTERS 1 GM C: 1 | 30 days supply | Qty: 120 | Fill #0

## 2018-03-01 ENCOUNTER — Ambulatory Visit: Payer: Self-pay | Attending: Nurse Practitioner | Admitting: Physician Assistant

## 2018-03-01 VITALS — BP 98/64 | HR 63 | Temp 98.1°F | Resp 18 | Ht 65.0 in | Wt 182.0 lb

## 2018-03-01 DIAGNOSIS — Z79890 Hormone replacement therapy (postmenopausal): Secondary | ICD-10-CM | POA: Insufficient documentation

## 2018-03-01 DIAGNOSIS — E039 Hypothyroidism, unspecified: Secondary | ICD-10-CM | POA: Insufficient documentation

## 2018-03-01 DIAGNOSIS — K089 Disorder of teeth and supporting structures, unspecified: Secondary | ICD-10-CM | POA: Insufficient documentation

## 2018-03-01 DIAGNOSIS — Z79899 Other long term (current) drug therapy: Secondary | ICD-10-CM | POA: Insufficient documentation

## 2018-03-01 DIAGNOSIS — H11002 Unspecified pterygium of left eye: Secondary | ICD-10-CM | POA: Insufficient documentation

## 2018-03-01 MED ORDER — LEVOTHYROXINE SODIUM 25 MCG PO TABS: 25.0000 ug | ORAL_TABLET | Freq: Every day | ORAL | 5 refills | Status: DC

## 2018-03-01 MED FILL — ?LEVOTHYROXINE 25 MCG TABLE: 25 | 30 days supply | Qty: 30 | Fill #0

## 2018-03-01 NOTE — Progress Notes (Signed)
Patient ID: Courtney Edwards, female   DOB: 01/02/1976, 42 y.o.   MRN: [id number]        Courtney Edwards, is a 42 y.o. female  [id number]  [id number]  DOB - 04/06/1976  Subjective:  Chief Complaint and HPI: Courtney Edwards is a 42 y.o. female here today for  RF of synthroid. Needs referral for dentist.  Requesting referral for ophthalmology.  Has had a pterygium on her L eye for about 12 years and she feels like it is starting to affect her vision.  C/o blurry vision with reading.   [employer] interpreters "[name]" translating.   ROS:   Constitutional:  No f/c, No night sweats, No unexplained weight loss. EENT:   No hearing changes. No other mouth, throat, or ear problems.  Respiratory: No cough, No SOB Cardiac: No CP, no palpitations GI:  No abd pain, No N/V/D. GU: No Urinary s/sx Musculoskeletal: No joint pain Neuro: No headache, no dizziness, no motor weakness.  Skin: No rash Endocrine:  No polydipsia. No polyuria.  Psych: Denies SI/HI  Problem  Pterygium of Left Eye    ALLERGIES: No Known Allergies  PAST MEDICAL HISTORY: Past Medical History:  Diagnosis Date  . Hypothyroidism   . Low blood pressure   . Medical history non-contributory   . Mental disorder   . Post partum depression 2002    MEDICATIONS AT HOME: Prior to Admission medications   Medication Sig Start Date End Date Taking? Authorizing Provider  atorvastatin (LIPITOR) 40 MG tablet Take 1 tablet (40 mg total) by mouth daily at 6 PM. 01/24/18  Yes Claiborne RiggFleming, Zelda W, NP  levothyroxine (SYNTHROID) 25 MCG tablet Take 1 tablet (25 mcg total) by mouth daily before breakfast. 03/01/18  Yes McClung, Angela M, PA-C  lidocaine (XYLOCAINE) 5 % ointment Apply 1 application topically as needed. 01/20/18  Yes Claiborne RiggFleming, Zelda W, NP  Norgestimate-Ethinyl Estradiol Triphasic 0.18/0.215/0.25 MG-25 MCG tab Take 1 tablet by mouth daily. 10/11/17  Yes Adam PhenixArnold, James G, MD  omega-3 acid ethyl  esters (LOVAZA) 1 g capsule Take 2 capsules (2 g total) by mouth 2 (two) times daily. 01/24/18 02/23/18  Claiborne RiggFleming, Zelda W, NP     Objective:  EXAM:   Vitals:   03/01/18 1042  BP: 98/64  Pulse: 63  Resp: 18  Temp: 98.1 F (36.7 C)  TempSrc: Oral  SpO2: 98%  Weight: 182 lb (82.6 kg)  Height: 5\' 5"  (1.651 m)    General appearance : A&OX3. NAD. Non-toxic-appearing HEENT: Atraumatic and Normocephalic.  PERRLA. EOM intact.  Pterygium L eye.  TM clear B. Mouth-MMM, post pharynx WNL w/o erythema, No PND.  Poor dentition. Neck: supple, no JVD. No cervical lymphadenopathy. No thyromegaly Chest/Lungs:  Breathing-non-labored, Good air entry bilaterally, breath sounds normal without rales, rhonchi, or wheezing  CVS: S1 S2 regular, no murmurs, gallops, rubs  Extremities: Bilateral Lower Ext shows no edema, both legs are warm to touch with = pulse throughout Neurology:  CN II-XII grossly intact, Non focal.   Psych:  TP linear. J/I WNL. Normal speech. Appropriate eye contact and affect.  Skin:  No Rash  Data Review Lab Results  Component Value Date   HGBA1C 5.50 01/27/2015     Assessment & Plan   1. Hypothyroidism, unspecified type Was stable at last labs about 1 month - levothyroxine (SYNTHROID) 25 MCG tablet; Take 1 tablet (25 mcg total) by mouth daily before breakfast.  Dispense: 30 tablet; Refill: 5  2. Poor dentition - Ambulatory  referral to Dentistry  3. Pterygium of left eye - Ambulatory referral to Ophthalmology   Patient have been counseled extensively about nutrition and exercise  Return in about 3 months (around 06/01/2018) for Zelda Fleming-f/up lipids and thyroid.  The patient was given clear instructions to go to ER or return to medical center if symptoms don't improve, worsen or new problems develop. The patient verbalized understanding. The patient was told to call to get lab results if they haven't heard anything in the next week.     Georgian Co,  PA-C [facility] and [facility] [place], [place] [phone]   03/01/2018, 10:55 AM

## 2018-03-27 ENCOUNTER — Ambulatory Visit: Payer: Self-pay | Attending: Family Medicine

## 2018-03-27 DIAGNOSIS — E785 Hyperlipidemia, unspecified: Secondary | ICD-10-CM

## 2018-03-28 LAB — LIPID PANEL
Chol/HDL Ratio: 6.5 ratio — ABNORMAL HIGH (ref 0.0–4.4)
Cholesterol, Total: 214 mg/dL — ABNORMAL HIGH (ref 100–199)
HDL: 33 mg/dL — AB (ref >39)
Triglycerides: 440 mg/dL — ABNORMAL HIGH (ref 0–149)

## 2018-03-30 ENCOUNTER — Telehealth: Payer: Self-pay

## 2018-03-30 NOTE — Telephone Encounter (Signed)
CMA attempt to call patient to inform on lab results.  No answer and left a VM for patient to call back.  If patient call back, please inform:  Cholesterol levels are still elevated. Please make sure you are taking both of your cholesterol medications. You should be taking omega 3. A total of 4 capsules and atorvastatin 1 tablet every day and not skipping any doses.  A letter will be send out to patient.

## 2018-03-30 NOTE — Telephone Encounter (Signed)
-----   Message from Claiborne RiggZelda W Fleming, NP sent at 03/30/2018  2:01 AM EDT ----- Cholesterol levels are still elevated. Please make sure you are taking both of your cholesterol medications. You should be taking omega 3. A total of 4 capsules and atorvastatin 1 tablet every day and not skipping any doses.

## 2018-04-05 MED FILL — OMEGA-3 ETHYL ESTERS 1 GM C: 1 | 30 days supply | Qty: 120 | Fill #1

## 2018-04-05 MED FILL — ?ATORVASTATIN 40MG TABLET: 40 | 30 days supply | Qty: 30 | Fill #1

## 2018-04-05 MED FILL — ?LEVOTHYROXINE 25 MCG TABLE: 25 | 30 days supply | Qty: 30 | Fill #1

## 2018-04-06 ENCOUNTER — Telehealth: Payer: Self-pay | Admitting: Nurse Practitioner

## 2018-04-06 NOTE — Telephone Encounter (Signed)
[facility] . They will contact the patient to schedule an appointment .

## 2018-04-06 NOTE — Telephone Encounter (Signed)
Pt came in to request her referral to physical therapy be opened because she has the 100% discount letter,please follow up she prefers a morning appointment on any monday

## 2018-04-24 ENCOUNTER — Ambulatory Visit: Payer: Self-pay | Attending: Nurse Practitioner | Admitting: Physical Therapy

## 2018-04-24 ENCOUNTER — Encounter: Payer: Self-pay | Admitting: Physical Therapy

## 2018-04-24 ENCOUNTER — Other Ambulatory Visit: Payer: Self-pay

## 2018-04-24 DIAGNOSIS — G8929 Other chronic pain: Secondary | ICD-10-CM | POA: Insufficient documentation

## 2018-04-24 DIAGNOSIS — M545 Low back pain: Secondary | ICD-10-CM | POA: Insufficient documentation

## 2018-04-24 DIAGNOSIS — M6281 Muscle weakness (generalized): Secondary | ICD-10-CM | POA: Insufficient documentation

## 2018-04-24 DIAGNOSIS — R252 Cramp and spasm: Secondary | ICD-10-CM | POA: Insufficient documentation

## 2018-04-24 NOTE — Therapy (Signed)
[facility] [address], [address] [place], [place], [address] Phone: [phone]   Fax:  [phone]  Physical Therapy Evaluation  Patient Details  Name: Courtney Edwards MRN: [id number] Date of Birth: 12-May-1976 Referring Provider: Claiborne Rigg   Encounter Date: 04/24/2018  PT End of Session - 04/24/18 1518    Visit Number  1    Date for PT Re-Evaluation  07/17/18    PT Start Time  1520    PT Stop Time  1610    PT Time Calculation (min)  50 min    Activity Tolerance  Patient tolerated treatment well;Patient limited by pain    Behavior During Therapy  [facility] for tasks assessed/performed       Past Medical History:  Diagnosis Date  . Hypothyroidism   . Low blood pressure   . Medical history non-contributory   . Mental disorder   . Post partum depression 2002    Past Surgical History:  Procedure Laterality Date  . LAPAROSCOPY N/A 02/09/2017   Procedure: LAPAROSCOPY DIAGNOSTIC;  Surgeon: Allie Bossier, MD;  Location: [facility];  Service: Gynecology;  Laterality: N/A;  . NO PAST SURGERIES      There were no vitals filed for this visit.   Subjective Assessment - 04/24/18 1521    Subjective  Pt has been having pain in low abdomen, low back and in the vagina feels like pressure.  It was worse last year but only gotten a little better.     Patient is accompained by:  Interpreter    Pertinent History  5 vaginal deliveries    Limitations  Lifting    Patient Stated Goals  get rid of pain    Currently in Pain?  Yes    Pain Score  4    10/10 at worst   Pain Location  Vagina    Pain Orientation  Mid    Pain Descriptors / Indicators  Pressure    Pain Type  Chronic pain    Pain Radiating Towards  low back and lower abdomen    Pain Onset  More than a month ago    Pain Frequency  Intermittent    Aggravating Factors   lifting heavy things,     Pain Relieving Factors  resting and lying down    Multiple Pain Sites   No         [facility] PT Assessment - 04/24/18 0001      Assessment   Medical Diagnosis  R10.30 (ICD-10-CM) - Lower abdominal pain; N94.10 (ICD-10-CM) - Dyspareunia in female    Referring Provider  Bertram Denver W    Onset Date/Surgical Date  --   3 years ago   Prior Therapy  No      Precautions   Precautions  None      Restrictions   Weight Bearing Restrictions  No      Balance Screen   Has the patient fallen in the past 6 months  No      Home Environment   Living Environment  Private residence    Living Arrangements  Spouse/significant other;Children   5 children     Prior Function   Level of Independence  Independent    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, SAHM     Cognition   Overall Cognitive Status  Within Functional Limits for tasks assessed      Posture/Postural Control   Posture/Postural Control  Postural limitations  Postural Limitations  Rounded Shoulders;Increased lumbar lordosis;Anterior pelvic tilt      ROM / Strength   AROM / PROM / Strength  Strength;AROM      AROM   AROM Assessment Site  Lumbar    Lumbar Flexion  20% limited - pain and uses UE to stand up     Lumbar Extension  WNL +pain    Lumbar - Right Side Bend  WNL    Lumbar - Left Side Bend  WNL    Lumbar - Right Rotation  WNL    Lumbar - Left Rotation  WNL      Strength   Strength Assessment Site  Hip    Right/Left Hip  Right;Left    Right Hip Flexion  5/5    Right Hip External Rotation   4+/5    Right Hip Internal Rotation  4+/5    Right Hip ABduction  4/5    Right Hip ADduction  3/5   pain   Left Hip Flexion  5/5    Left Hip External Rotation  5/5    Left Hip Internal Rotation  5/5    Left Hip ABduction  4+/5    Left Hip ADduction  4/5   pain     Flexibility   Soft Tissue Assessment /Muscle Length  yes    Hamstrings  20% limited      Palpation   Palpation comment  hip flexor Rt side, lower abdomen tender to palpation, upper abdomen tender to palpation      Special Tests     Special Tests  Lumbar    Lumbar Tests  Straight Leg Raise      Straight Leg Raise   Comment  pain with BLE; worse with lumbar support, did not test TrA or rectus ab support due to high sensativity of these areas      Ambulation/Gait   Gait Pattern  Decreased stride length   stiff               Objective measurements completed on examination: See above findings.    Pelvic Floor Special Questions - 04/24/18 0001    Prior Pelvic/Prostate Exam  Yes    Date of Last Pelvic/Prostate Exam  --   June/July 2019   Are you Pregnant or attempting pregnancy?  No    Prior Pregnancies  Yes    Number of Vaginal Deliveries  5    Any difficulty with labor and deliveries  Yes    Currently Sexually Active  Yes    Is this Painful  Yes    Marinoff Scale  pain prevents any attempts at intercourse    Urinary Leakage  Yes    Activities that cause leaking  Coughing;Sneezing;Laughing    Urinary urgency  No    Urinary frequency  normal    Fecal incontinence  No    Fluid intake  3 big glasses    Caffeine beverages  no    Falling out feeling (prolapse)  Yes    Skin Integrity  Intact    Scar  Well healed    External Palpation  tender to palpation ischiocavernosis and transvers peroneus tender to palpatoin    Pelvic Floor Internal Exam  Pt informed and consent given to perform internal soft tissue assessment    Exam Type  Vaginal    Sensation  normal    Palpation  Left pubococcygeus is weaker than Rt, bulbocavernosis weak without strong closure, tender throughout pelvic floor    Strength  weak squeeze, no lift    Strength # of reps  4    Strength # of seconds  1    Tone  mixed       [facility] Adult PT Treatment/Exercise - 04/24/18 0001      Self-Care   Self-Care  Other Self-Care Comments    Other Self-Care Comments   breathing and stretch for pelvic pain             PT Education - 04/24/18 1607    Education Details   Access Code: [email or web address] , pelvic pain stretch    Person(s) Educated   Patient    Methods  Explanation;Demonstration;Handout    Comprehension  Verbalized understanding;Returned demonstration       PT Short Term Goals - 04/24/18 1709      PT SHORT TERM GOAL #1   Title  pt will be ind with initial HEP    Time  4    Period  Weeks    Status  New    Target Date  05/22/18      PT SHORT TERM GOAL #2   Title  Pt will report 20% less pain throughout the day    Time  4    Period  Weeks    Status  New    Target Date  05/22/18        PT Long Term Goals - 04/24/18 1710      PT LONG TERM GOAL #1   Title  Pt will be ind with advanced HEP    Time  12    Period  Weeks    Status  New    Target Date  07/17/18      PT LONG TERM GOAL #2   Title  Pt will demonstrate hip adduction at least 4+/5 MMT bialterally for improved functional activities such as squatting    Time  12    Period  Weeks    Status  New    Target Date  07/17/18      PT LONG TERM GOAL #3   Title  Pt will report 1/3 Marinoff scale     Baseline  3/3    Time  12    Period  Weeks    Status  New    Target Date  07/17/18      PT LONG TERM GOAL #4   Title  Pt will report at least 70% less pressure in the vagina during typical daily activities.    Time  12    Period  Weeks    Status  New    Target Date  07/17/18             Plan - 04/24/18 1734    Clinical Impression Statement  Pt presents to clinic due to pelvic pain and pressure in the vagina.  Pt had traumatic delivery giving birth to her 5th child.  She states she has had pain ever since that time.  She reports it has only gotten a little better.  Pt has pain that radiates around lower abdomen and low back.  She demonstrates pelvic floor weakness of 2/5 strength and low endurance of 2 seconds.  Pt has LE weakness and decreased hip ROM bilaterally.  Pt has decreased lumbar ROM and pain with flexion.  She has fascial restriction and tenderness to palpation .  Postural abnormalities are present as mentioned above.  Pt will benefit  from skilled PT to adress impairments and return to all functional activities.  History and Personal Factors relevant to plan of care:  5 vaginal deliveries    Clinical Presentation  Stable    Clinical Presentation due to:  pt is stable    Clinical Decision Making  Moderate    Rehab Potential  Good    PT Frequency  2x / week    PT Duration  12 weeks    PT Treatment/Interventions  ADLs/Self Care Home Management;Biofeedback;Cryotherapy;Electrical Stimulation;Moist Heat;Therapeutic activities;Therapeutic exercise;Neuromuscular re-education;Patient/family education;Manual techniques;Passive range of motion;Dry needling;Taping    PT Next Visit Plan  abdominal fascial release and lymph massage, review stretches and breathing, internal STM    PT Home Exercise Plan   Access Code: [email or web address]     Recommended Other Services  eval 04/24/18    Consulted and Agree with Plan of Care  Patient       Patient will benefit from skilled therapeutic intervention in order to improve the following deficits and impairments:  Pain, Increased fascial restricitons, Postural dysfunction, Decreased strength, Decreased endurance, Decreased range of motion, Impaired flexibility, Decreased scar mobility, Decreased coordination  Visit Diagnosis: Cramp and spasm  Chronic low back pain, unspecified back pain laterality, with sciatica presence unspecified  Muscle weakness (generalized)     Problem List Patient Active Problem List   Diagnosis Date Noted  . Pterygium of left eye 03/01/2018  . Chronic left shoulder pain 04/19/2017  . Chronic back pain greater than 3 months duration 04/19/2017  . Dyslipidemia 04/19/2017  . Hypothyroidism 04/07/2017  . HLD (hyperlipidemia) 02/06/2015  . Dyspareunia 05/20/2014  . Unspecified constipation 08/06/2013  . Onychomycosis 08/06/2013  . Hemorrhoid 08/06/2013  . GERD (gastroesophageal reflux disease) 08/06/2013  . Muscle ache 08/06/2013  . LOW BACK PAIN, MILD 03/02/2007  .  HYPERGLYCEMIA 11/03/2006    Vincente Poli, PT 04/24/2018, 5:41 PM  [facility] [address], [address] [place], [place], [address] Phone: [phone]   Fax:  [phone]  Name: Ayauna Tenn MRN: [id number] Date of Birth: Aug 30, 1975

## 2018-04-24 NOTE — Patient Instructions (Signed)
Supine Knee-to-Chest, Unilateral   Lie on back, hands clasped behind one knee. Pull knee in toward chest until a comfortable stretch is felt in lower back and buttocks. Then other leg.  Hold _30 __ seconds.  Repeat _2__ times per session. Do _2__ sessions per day.  Copyright  VHI. All rights reserved.    Lie supine, one heel on ball, other leg crossed over knee. Draw ball toward body. Hold ___ seconds. Do ___ sets of ___ repetitions.  Copyright  VHI. All rights reserved.  Outer Hip Stretch: Figure Four (Chair)   Adjust bend of supporting leg for less deep stretch. Lean forward to flex hips. Hamstring Stretch  Hold for __30 seconds. Repeat __2__ times each leg. 3 times per day  Copyright  VHI. All rights reserved.    Hamstring Stretch (Sitting)   Sitting, extend one leg and place hands on same thigh for support. Keeping torso straight, lean forward, sliding hands down leg, until a stretch is felt in back of thigh. Hold _30___ seconds. 2 times.  Repeat with other leg. 3 times per day.  Butterfly, Supine   Lie on back, feet together. Lower knees toward floor. Hold _30__ seconds. Repeat _3__ times per session. Do _3__ sessions per day.  Copyright  VHI. All rights reserved.  Angry Cat   Kneel on a soft surface to cushion knees. Place pillow under sound foot and shin. Tuck chin and tighten stomach to arch back and breath in. Sink your spine and breath out. Hold _2___ seconds. Repeat _10___ times. Do __3__ sessions per day.  Copyright  VHI. All rights reserved.  Knee Flexion Mobilization (All-Fours)   Slowly move trunk and hips back, bending knees, until a gentle stretch is felt. Hold _30___ seconds. Relax. Repeat _3___ times per set. Do ___1_ sets per session. Do ___3_ sessions per day.  http://orth.exer.us/724   Copyright  VHI. All rights reserved.  Breathing exercise while watching the video of figure expanding 2 minutes 3 times per day.    .  Patient return  demonstration for above exercises with minimal verbal cues on technique. 

## 2018-05-05 ENCOUNTER — Ambulatory Visit: Payer: Self-pay | Admitting: Physical Therapy

## 2018-05-05 ENCOUNTER — Encounter: Payer: Self-pay | Admitting: Physical Therapy

## 2018-05-05 DIAGNOSIS — R252 Cramp and spasm: Secondary | ICD-10-CM

## 2018-05-05 DIAGNOSIS — M545 Low back pain: Secondary | ICD-10-CM

## 2018-05-05 DIAGNOSIS — M6281 Muscle weakness (generalized): Secondary | ICD-10-CM

## 2018-05-05 DIAGNOSIS — G8929 Other chronic pain: Secondary | ICD-10-CM

## 2018-05-05 NOTE — Patient Instructions (Signed)
Roll Legs with roller - spiky balls or smooth will work Do sides of leg 30x/day Roll bottom of feet 1 min each foot  Abdominal massage: Place both hands on abdomen Let hands sink down with as much pressure that is comfortable Move hands 1 inch in each direction and hold for 30-60 seconds  [facility] [address], [address] [place], [place] [address] Phone # [phone] Fax [phone]

## 2018-05-05 NOTE — Therapy (Signed)
[facility] [address], [address] [place], [place], [address] Phone: [phone]   Fax:  [phone]  Physical Therapy Treatment  Patient Details  Name: Courtney CosierMiriam Leticia Constancia [name] MRN: [id number] Date of Birth: 05/10/1976 Referring Provider: Claiborne RiggFleming, Zelda W   Encounter Date: 05/05/2018  PT End of Session - 05/05/18 0956    Visit Number  2    Date for PT Re-Evaluation  07/17/18    PT Start Time  0846    PT Stop Time  0928    PT Time Calculation (min)  42 min    Activity Tolerance  Patient tolerated treatment well;Patient limited by pain    Behavior During Therapy  [facility] for tasks assessed/performed       Past Medical History:  Diagnosis Date  . Hypothyroidism   . Low blood pressure   . Medical history non-contributory   . Mental disorder   . Post partum depression 2002    Past Surgical History:  Procedure Laterality Date  . LAPAROSCOPY N/A 02/09/2017   Procedure: LAPAROSCOPY DIAGNOSTIC;  Surgeon: Allie Bossierove, Myra C, MD;  Location: [facility];  Service: Gynecology;  Laterality: N/A;  . NO PAST SURGERIES      There were no vitals filed for this visit.  Subjective Assessment - 05/05/18 0847    Subjective  It is the same as it was.  Have been doing the stretches.  Denies pain currently    Patient Stated Goals  get rid of pain    Currently in Pain?  No/denies                       [facility]/Exercise - 05/05/18 0001      Self-Care   Other Self-Care Comments   self massage with roller, self massage to abdomen      Manual Therapy   Manual therapy comments  abdominal and lumbar fascia release; STM to bialteral IT bands, quads, adductors; left glutes             PT Education - 05/05/18 0956    Education Details  rolling out legs, abdominal massage    Person(s) Educated  Patient    Methods  Explanation;Demonstration;Handout;Verbal cues;Tactile cues    Comprehension  Verbalized  understanding;Returned demonstration       PT Short Term Goals - 05/05/18 0959      PT SHORT TERM GOAL #1   Title  pt will be ind with initial HEP    Status  On-going      PT SHORT TERM GOAL #2   Title  Pt will report 20% less pain throughout the day    Status  On-going        PT Long Term Goals - 04/24/18 1710      PT LONG TERM GOAL #1   Title  Pt will be ind with advanced HEP    Time  12    Period  Weeks    Status  New    Target Date  07/17/18      PT LONG TERM GOAL #2   Title  Pt will demonstrate hip adduction at least 4+/5 MMT bialterally for improved functional activities such as squatting    Time  12    Period  Weeks    Status  New    Target Date  07/17/18      PT LONG TERM GOAL #3   Title  Pt will report 1/3 Marinoff scale  Baseline  3/3    Time  12    Period  Weeks    Status  New    Target Date  07/17/18      PT LONG TERM GOAL #4   Title  Pt will report at least 70% less pressure in the vagina during typical daily activities.    Time  12    Period  Weeks    Status  New    Target Date  07/17/18            Plan - 05/05/18 0956    Clinical Impression Statement  Pt has a high level of tenderness and sensativity to pressure throughout her abdomen.  She had trigger points in IT band and lower leg bilaterally.  Very tight fascial restrictions in lumbar region.  All areas released with manaul techniques . Pt was educated in home program.  She will benefit from skilled PT to progress soft tissue release for improved health and densensatizing soft tissues.    PT Treatment/Interventions  ADLs/Self Care Home Management;Biofeedback;Cryotherapy;Electrical Stimulation;Moist Heat;Therapeutic activities;Therapeutic exercise;Neuromuscular re-education;Patient/family education;Manual techniques;Passive range of motion;Dry needling;Taping    PT Next Visit Plan  abdominal fascial release and lymph massage, review stretches and breathing, pelvic floor external STM or  internal STM if tolerated    PT Home Exercise Plan   Access Code: [email or web address]     Consulted and Agree with Plan of Care  Patient       Patient will benefit from skilled therapeutic intervention in order to improve the following deficits and impairments:  Pain, Increased fascial restricitons, Postural dysfunction, Decreased strength, Decreased endurance, Decreased range of motion, Impaired flexibility, Decreased scar mobility, Decreased coordination  Visit Diagnosis: Cramp and spasm  Chronic low back pain, unspecified back pain laterality, with sciatica presence unspecified  Muscle weakness (generalized)     Problem List Patient Active Problem List   Diagnosis Date Noted  . Pterygium of left eye 03/01/2018  . Chronic left shoulder pain 04/19/2017  . Chronic back pain greater than 3 months duration 04/19/2017  . Dyslipidemia 04/19/2017  . Hypothyroidism 04/07/2017  . HLD (hyperlipidemia) 02/06/2015  . Dyspareunia 05/20/2014  . Unspecified constipation 08/06/2013  . Onychomycosis 08/06/2013  . Hemorrhoid 08/06/2013  . GERD (gastroesophageal reflux disease) 08/06/2013  . Muscle ache 08/06/2013  . LOW BACK PAIN, MILD 03/02/2007  . HYPERGLYCEMIA 11/03/2006    Vincente Poli, PT 05/05/2018, 10:02 AM  [facility] [address] [place], [place], [address] Phone: [phone]   Fax:  [phone]  Name: Aleatha Taite MRN: [id number] Date of Birth: 24-Jan-1976

## 2018-05-08 ENCOUNTER — Ambulatory Visit: Payer: Self-pay | Admitting: Physical Therapy

## 2018-05-08 DIAGNOSIS — M545 Low back pain: Secondary | ICD-10-CM

## 2018-05-08 DIAGNOSIS — G8929 Other chronic pain: Secondary | ICD-10-CM

## 2018-05-08 DIAGNOSIS — M6281 Muscle weakness (generalized): Secondary | ICD-10-CM

## 2018-05-08 DIAGNOSIS — R252 Cramp and spasm: Secondary | ICD-10-CM

## 2018-05-08 NOTE — Patient Instructions (Signed)
Access Code: [email or web address]  URL: [email or web address]  Date: 05/08/2018  Prepared by: Dorie RankJacqueline Crosser   Exercises  Supine Diaphragmatic Breathing - 10 reps - 1 sets - 3x daily - 7x weekly  Supine Figure 4 Piriformis Stretch - 3 reps - 1 sets - 30 sec hold - 1x daily - 7x weekly  Supine Hamstring Stretch - 3 reps - 1 sets - 30 sec hold - 1x daily - 7x weekly  Supine Piriformis Stretch with Foot on Ground - 3 reps - 1 sets - 30 sec hold - 1x daily - 7x weekly  Supine Hip Adductor Stretch - 3 reps - 1 sets - 30 sec hold - 1x daily - 7x weekly

## 2018-05-08 NOTE — Therapy (Signed)
[facility] [address], [address] [place], [place], [address] Phone: [phone]   Fax:  [phone]  Physical Therapy Treatment  Patient Details  Name: Courtney Edwards MRN: [id number] Date of Birth: 08/24/1975 Referring Provider: Claiborne RiggFleming, Zelda W   Encounter Date: 05/08/2018  PT End of Session - 05/08/18 1104    Visit Number  3    PT Start Time  1010    PT Stop Time  1102    PT Time Calculation (min)  52 min    Activity Tolerance  Patient tolerated treatment well;Patient limited by pain    Behavior During Therapy  [facility] for [facility]formed       Past Medical History:  Diagnosis Date  . Hypothyroidism   . Low blood pressure   . Medical history non-contributory   . Mental disorder   . Post partum depression 2002    Past Surgical History:  Procedure Laterality Date  . LAPAROSCOPY N/A 02/09/2017   Procedure: LAPAROSCOPY DIAGNOSTIC;  Surgeon: Allie Bossierove, Myra C, MD;  Location: [facility];  Service: Gynecology;  Laterality: N/A;  . NO PAST SURGERIES      There were no vitals filed for this visit.  Subjective Assessment - 05/08/18 1105    Subjective  I feel like the self massage has gotten easier and less painful.  The stretches are harder to do on the left side.  Denies pain currently.  Sometimes during the day I have some     Patient Stated Goals  get rid of pain    Currently in Pain?  No/denies                       [facility] Adult PT Treatment/Exercise - 05/08/18 0001      Neuro Re-ed    Neuro Re-ed Details   breathing technique for diaphragmatic breathing cues for abdomen and ribcage movement, breathing into pelvic floor with stretches      Exercises   Exercises  Knee/Hip      Knee/Hip Exercises: Stretches   Active Hamstring Stretch  Right;Left;30 seconds    Piriformis Stretch  Right;Left;30 seconds   figrure 4 and knee across body   Other Knee/Hip Stretches  adductor stretch  - 2x  30 sec      Modalities   Modalities  Moist Heat      Moist Heat Therapy   Number Minutes Moist Heat  20 Minutes   during manual treatment   Moist Heat Location  Lumbar Spine      Manual Therapy   Manual therapy comments  abdominal fascial release at hip flexors and diaphragm; external pelvic floor and adductor facsial release ischio and bulbocavernosis             PT Education - 05/08/18 1104    Education Details   Access Code: [email or web address]     Person(s) Educated  Patient    Methods  Explanation;Demonstration;Handout;Verbal cues;Tactile cues    Comprehension  Verbalized understanding;Returned demonstration       PT Short Term Goals - 05/08/18 1105      PT SHORT TERM GOAL #1   Title  pt will be ind with initial HEP    Status  Achieved      PT SHORT TERM GOAL #2   Title  Pt will report 20% less pain throughout the day    Status  On-going        PT Long Term Goals - 04/24/18 1710  PT LONG TERM GOAL #1   Title  Pt will be ind with advanced HEP    Time  12    Period  Weeks    Status  New    Target Date  07/17/18      PT LONG TERM GOAL #2   Title  Pt will demonstrate hip adduction at least 4+/5 MMT bialterally for improved functional activities such as squatting    Time  12    Period  Weeks    Status  New    Target Date  07/17/18      PT LONG TERM GOAL #3   Title  Pt will report 1/3 Marinoff scale     Baseline  3/3    Time  12    Period  Weeks    Status  New    Target Date  07/17/18      PT LONG TERM GOAL #4   Title  Pt will report at least 70% less pressure in the vagina during typical daily activities.    Time  12    Period  Weeks    Status  New    Target Date  07/17/18            Plan - 05/08/18 1109    Clinical Impression Statement  Pt is experiencing less pain with self massage and is independent with initial HEP.  She has very tender and tight fascial restrictions throughout abdomen and experiences referral pain when addressing pelvic  floor with pain referred into abdomen under the ribcage.  Pt needed a lot of cues when learning how to coordinate breathing and engaging abdominal muscles.  She was able to get a little more ribcage movment at the end of the session. Pt will benefit from skilled PT to address soft tissue restrictions and improve muscle coordination.    PT Treatment/Interventions  ADLs/Self Care Home Management;Biofeedback;Cryotherapy;Electrical Stimulation;Moist Heat;Therapeutic activities;Therapeutic exercise;Neuromuscular re-education;Patient/family education;Manual techniques;Passive range of motion;Dry needling;Taping    PT Next Visit Plan  abdominal fascial release, review stretches and breathing, pelvic floor external STM or internal STM if tolerated    PT Home Exercise Plan   Access Code: [email or web address]     Consulted and Agree with Plan of Care  Patient       Patient will benefit from skilled therapeutic intervention in order to improve the following deficits and impairments:  Pain, Increased fascial restricitons, Postural dysfunction, Decreased strength, Decreased endurance, Decreased range of motion, Impaired flexibility, Decreased scar mobility, Decreased coordination  Visit Diagnosis: Cramp and spasm  Chronic low back pain, unspecified back pain laterality, with sciatica presence unspecified  Muscle weakness (generalized)     Problem List Patient Active Problem List   Diagnosis Date Noted  . Pterygium of left eye 03/01/2018  . Chronic left shoulder pain 04/19/2017  . Chronic back pain greater than 3 months duration 04/19/2017  . Dyslipidemia 04/19/2017  . Hypothyroidism 04/07/2017  . HLD (hyperlipidemia) 02/06/2015  . Dyspareunia 05/20/2014  . Unspecified constipation 08/06/2013  . Onychomycosis 08/06/2013  . Hemorrhoid 08/06/2013  . GERD (gastroesophageal reflux disease) 08/06/2013  . Muscle ache 08/06/2013  . LOW BACK PAIN, MILD 03/02/2007  . HYPERGLYCEMIA 11/03/2006    Vincente Poli,  PT 05/08/2018, 11:24 AM  [facility] [address] [place], [place], [address] Phone: [phone]   Fax:  [phone]  Name: Courtney Edwards MRN: [id number] Date of Birth: 1976/03/31

## 2018-05-12 ENCOUNTER — Encounter: Payer: Self-pay | Admitting: Physical Therapy

## 2018-05-12 ENCOUNTER — Ambulatory Visit: Payer: Self-pay | Admitting: Physical Therapy

## 2018-05-12 DIAGNOSIS — R252 Cramp and spasm: Secondary | ICD-10-CM

## 2018-05-12 DIAGNOSIS — M6281 Muscle weakness (generalized): Secondary | ICD-10-CM

## 2018-05-12 DIAGNOSIS — M545 Low back pain: Secondary | ICD-10-CM

## 2018-05-12 DIAGNOSIS — G8929 Other chronic pain: Secondary | ICD-10-CM

## 2018-05-12 NOTE — Therapy (Signed)
[facility] [address], [address] [place], [place], [address] Phone: [phone]   Fax:  [phone]  Physical Therapy Treatment  Patient Details  Name: Courtney Edwards MRN: [id number] Date of Birth: [birth year] Referring Provider (PT): Claiborne Rigg   Encounter Date: 05/12/2018  PT End of Session - 05/12/18 0937    Visit Number  4    Date for PT Re-Evaluation  07/17/18    PT Start Time  0931    PT Stop Time  1015    PT Time Calculation (min)  44 min    Activity Tolerance  Patient tolerated treatment well;Patient limited by pain    Behavior During Therapy  [facility] for tasks assessed/performed       Past Medical History:  Diagnosis Date  . Hypothyroidism   . Low blood pressure   . Medical history non-contributory   . Mental disorder   . Post partum depression 2002    Past Surgical History:  Procedure Laterality Date  . LAPAROSCOPY N/A 02/09/2017   Procedure: LAPAROSCOPY DIAGNOSTIC;  Surgeon: Allie Bossier, MD;  Location: [facility];  Service: Gynecology;  Laterality: N/A;  . NO PAST SURGERIES      There were no vitals filed for this visit.  Subjective Assessment - 05/12/18 0939    Subjective  I felt better after the last session and more relaxed.      Patient Stated Goals  get rid of pain    Currently in Pain?  No/denies                       [facility] Adult PT Treatment/Exercise - 05/12/18 0001      Neuro Re-ed    Neuro Re-ed Details   breathing with thoracic extesions; sitting on ball circles and breathing with bulging      Exercises   Exercises  Knee/Hip;Lumbar      Lumbar Exercises: Supine   Other Supine Lumbar Exercises  towel for thoracic extension with breathing arms overhead - 10x       Lumbar Exercises: Quadruped   [place]cat/Old Horse  10 reps   need cues for correct movement patterns; increased pain some     Manual Therapy   Manual therapy comments  abdominal fascial release  at hip flexors one had posterior one had anterior               PT Short Term Goals - 05/12/18 1204      PT SHORT TERM GOAL #2   Title  Pt will report 20% less pain throughout the day    Baseline  noticing it feels a little better    Status  On-going        PT Long Term Goals - 04/24/18 1710      PT LONG TERM GOAL #1   Title  Pt will be ind with advanced HEP    Time  12    Period  Weeks    Status  New    Target Date  07/17/18      PT LONG TERM GOAL #2   Title  Pt will demonstrate hip adduction at least 4+/5 MMT bialterally for improved functional activities such as squatting    Time  12    Period  Weeks    Status  New    Target Date  07/17/18      PT LONG TERM GOAL #3   Title  Pt will report 1/3  Marinoff scale     Baseline  3/3    Time  12    Period  Weeks    Status  New    Target Date  07/17/18      PT LONG TERM GOAL #4   Title  Pt will report at least 70% less pressure in the vagina during typical daily activities.    Time  12    Period  Weeks    Status  New    Target Date  07/17/18            Plan - 05/12/18 1155    Clinical Impression Statement  Patient did well today.  She had some difficulty with cat cow needing heavy cueing for correct coordination of movements.  Pt was able to tolerate more pressure to abdomen but still very tender especially on Lt side.  She reports she has noticed always feeling more tender on that side.  Pt will benefit from skilled PT to continue working on mobitly and improved abilty to releax pelvic floor.    PT Treatment/Interventions  ADLs/Self Care Home Management;Biofeedback;Cryotherapy;Electrical Stimulation;Moist Heat;Therapeutic activities;Therapeutic exercise;Neuromuscular re-education;Patient/family education;Manual techniques;Passive range of motion;Dry needling;Taping    PT Next Visit Plan  SI joint assess, pelvic floor release, f/u on thoracic extension, add rotation, sitting on ball    PT Home Exercise Plan    Access Code: [email or web address]     Consulted and Agree with Plan of Care  Patient       Patient will benefit from skilled therapeutic intervention in order to improve the following deficits and impairments:  Pain, Increased fascial restricitons, Postural dysfunction, Decreased strength, Decreased endurance, Decreased range of motion, Impaired flexibility, Decreased scar mobility, Decreased coordination  Visit Diagnosis: Cramp and spasm  Chronic low back pain, unspecified back pain laterality, with sciatica presence unspecified  Muscle weakness (generalized)     Problem List Patient Active Problem List   Diagnosis Date Noted  . Pterygium of left eye 03/01/2018  . Chronic left shoulder pain 04/19/2017  . Chronic back pain greater than 3 months duration 04/19/2017  . Dyslipidemia 04/19/2017  . Hypothyroidism 04/07/2017  . HLD (hyperlipidemia) 02/06/2015  . Dyspareunia 05/20/2014  . Unspecified constipation 08/06/2013  . Onychomycosis 08/06/2013  . Hemorrhoid 08/06/2013  . GERD (gastroesophageal reflux disease) 08/06/2013  . Muscle ache 08/06/2013  . LOW BACK PAIN, MILD 03/02/2007  . HYPERGLYCEMIA 11/03/2006    Vincente Poli, PT 05/12/2018, 12:04 PM  [facility] [address] [place], [place], [address] Phone: [phone]   Fax:  [phone]  Name: Courtney Edwards MRN: [id number] Date of Birth: 15-Jan-1976

## 2018-05-15 ENCOUNTER — Ambulatory Visit: Payer: Self-pay | Admitting: Physical Therapy

## 2018-05-15 ENCOUNTER — Encounter: Payer: Self-pay | Admitting: Physical Therapy

## 2018-05-15 DIAGNOSIS — M545 Low back pain: Secondary | ICD-10-CM

## 2018-05-15 DIAGNOSIS — G8929 Other chronic pain: Secondary | ICD-10-CM

## 2018-05-15 DIAGNOSIS — M6281 Muscle weakness (generalized): Secondary | ICD-10-CM

## 2018-05-15 DIAGNOSIS — R252 Cramp and spasm: Secondary | ICD-10-CM

## 2018-05-15 NOTE — Therapy (Signed)
[facility] [address], [address] [place], [place], [address] Phone: [phone]   Fax:  [phone]  Physical Therapy Treatment  Patient Details  Name: Courtney Edwards MRN: [id number] Date of Birth: Jun 02, 1976 Referring Provider (PT): Claiborne Rigg   Encounter Date: 05/15/2018  PT End of Session - 05/15/18 1343    Visit Number  5    Date for PT Re-Evaluation  07/17/18    PT Start Time  1101    PT Stop Time  1145    PT Time Calculation (min)  44 min    Activity Tolerance  Patient tolerated treatment well;Patient limited by pain    Behavior During Therapy  [facility] for tasks assessed/performed       Past Medical History:  Diagnosis Date  . Hypothyroidism   . Low blood pressure   . Medical history non-contributory   . Mental disorder   . Post partum depression 2002    Past Surgical History:  Procedure Laterality Date  . LAPAROSCOPY N/A 02/09/2017   Procedure: LAPAROSCOPY DIAGNOSTIC;  Surgeon: Allie Bossier, MD;  Location: [facility];  Service: Gynecology;  Laterality: N/A;  . NO PAST SURGERIES      There were no vitals filed for this visit.  Subjective Assessment - 05/15/18 1107    Subjective  Pt denies pain currently but some pain when standing down the left leg.    Patient is accompained by:  Interpreter   telephone   Pertinent History  5 vaginal deliveries    Limitations  Lifting    Patient Stated Goals  get rid of pain                       [facility] Adult PT Treatment/Exercise - 05/15/18 0001      Neuro Re-ed    Neuro Re-ed Details   sitting on ball circels bith ways      Exercises   Exercises  Lumbar      Lumbar Exercises: Standing   Other Standing Lumbar Exercises  thoracic extension;       Lumbar Exercises: Prone   Straight Leg Raise  10 reps   cues to engage core     Manual Therapy   Manual therapy comments  abdominal fascial release at hip flexors ; external pelvic  floor and adductor facsial release ischio and bulbocavernosis, OI and trasverse peroneus               PT Short Term Goals - 05/12/18 1204      PT SHORT TERM GOAL #2   Title  Pt will report 20% less pain throughout the day    Baseline  noticing it feels a little better    Status  On-going        PT Long Term Goals - 04/24/18 1710      PT LONG TERM GOAL #1   Title  Pt will be ind with advanced HEP    Time  12    Period  Weeks    Status  New    Target Date  07/17/18      PT LONG TERM GOAL #2   Title  Pt will demonstrate hip adduction at least 4+/5 MMT bialterally for improved functional activities such as squatting    Time  12    Period  Weeks    Status  New    Target Date  07/17/18      PT LONG TERM  GOAL #3   Title  Pt will report 1/3 Marinoff scale     Baseline  3/3    Time  12    Period  Weeks    Status  New    Target Date  07/17/18      PT LONG TERM GOAL #4   Title  Pt will report at least 70% less pressure in the vagina during typical daily activities.    Time  12    Period  Weeks    Status  New    Target Date  07/17/18            Plan - 05/15/18 1343    Clinical Impression Statement  Pt continues to have a lot of tenderness throughout abdomen with pain referring to abdomen when working on transverse peroneus and OI via externally to pelvic floor.  Pt is doing much better with massage at home.  She continues to have pain in left leg with any movements and during intercourse.  Pt will benefit from skilled PT to continue to progress soft tissue work for pain management and improve strength for functional activities.    PT Treatment/Interventions  ADLs/Self Care Home Management;Biofeedback;Cryotherapy;Electrical Stimulation;Moist Heat;Therapeutic activities;Therapeutic exercise;Neuromuscular re-education;Patient/family education;Manual techniques;Passive range of motion;Dry needling;Taping    PT Next Visit Plan  thoracic rotation and sitting on ball,  internal STM to pelvic floor    PT Home Exercise Plan   Access Code: [email or web address]     Consulted and Agree with Plan of Care  Patient       Patient will benefit from skilled therapeutic intervention in order to improve the following deficits and impairments:  Pain, Increased fascial restricitons, Postural dysfunction, Decreased strength, Decreased endurance, Decreased range of motion, Impaired flexibility, Decreased scar mobility, Decreased coordination  Visit Diagnosis: Cramp and spasm  Chronic low back pain, unspecified back pain laterality, with sciatica presence unspecified  Muscle weakness (generalized)     Problem List Patient Active Problem List   Diagnosis Date Noted  . Pterygium of left eye 03/01/2018  . Chronic left shoulder pain 04/19/2017  . Chronic back pain greater than 3 months duration 04/19/2017  . Dyslipidemia 04/19/2017  . Hypothyroidism 04/07/2017  . HLD (hyperlipidemia) 02/06/2015  . Dyspareunia 05/20/2014  . Unspecified constipation 08/06/2013  . Onychomycosis 08/06/2013  . Hemorrhoid 08/06/2013  . GERD (gastroesophageal reflux disease) 08/06/2013  . Muscle ache 08/06/2013  . LOW BACK PAIN, MILD 03/02/2007  . HYPERGLYCEMIA 11/03/2006    Vincente Poli, PT 05/15/2018, 1:55 PM  [facility] [address], [address] [place], [place], [address] Phone: [phone]   Fax:  [phone]  Name: Ellarose Brandi MRN: [id number] Date of Birth: [birth year]

## 2018-05-17 MED FILL — ?ATORVASTATIN 40MG TABLET: 40 | 30 days supply | Qty: 30 | Fill #2

## 2018-05-17 MED FILL — LEVOTHYROXINE 25 MCG TABLET: 25 | 30 days supply | Qty: 30 | Fill #5

## 2018-05-17 MED FILL — OMEGA-3 ETHYL ESTERS 1 GM C: 1 | 30 days supply | Qty: 120 | Fill #2

## 2018-05-19 ENCOUNTER — Ambulatory Visit: Payer: Self-pay | Attending: Nurse Practitioner | Admitting: Physical Therapy

## 2018-05-19 ENCOUNTER — Encounter: Payer: Self-pay | Admitting: Physical Therapy

## 2018-05-19 DIAGNOSIS — M6281 Muscle weakness (generalized): Secondary | ICD-10-CM | POA: Insufficient documentation

## 2018-05-19 DIAGNOSIS — R252 Cramp and spasm: Secondary | ICD-10-CM | POA: Insufficient documentation

## 2018-05-19 NOTE — Patient Instructions (Signed)
Access Code: [email or web address]

## 2018-05-19 NOTE — Therapy (Signed)
[facility] [address] [place], [place], [address] Phone: [phone]   Fax:  [phone]  Physical Therapy Treatment  Patient Details  Name: Courtney Edwards MRN: [id number] Date of Birth: July 06, 1976 Referring Provider (PT): Claiborne Rigg   Encounter Date: 05/19/2018  PT End of Session - 05/19/18 0800    Visit Number  6    Date for PT Re-Evaluation  07/17/18    PT Start Time  0800    PT Stop Time  0846    PT Time Calculation (min)  46 min    Activity Tolerance  Patient tolerated treatment well;Patient limited by pain    Behavior During Therapy  [facility] for tasks assessed/performed       Past Medical History:  Diagnosis Date  . Hypothyroidism   . Low blood pressure   . Medical history non-contributory   . Mental disorder   . Post partum depression 2002    Past Surgical History:  Procedure Laterality Date  . LAPAROSCOPY N/A 02/09/2017   Procedure: LAPAROSCOPY DIAGNOSTIC;  Surgeon: Allie Bossier, MD;  Location: [facility];  Service: Gynecology;  Laterality: N/A;  . NO PAST SURGERIES      There were no vitals filed for this visit.  Subjective Assessment - 05/19/18 0800    Subjective  Pt denies pain currently.  She reports she is doing well this morning.  States she had a lot of pain yesterday and today    Patient is accompained by:  Interpreter    Patient Stated Goals  get rid of pain    Currently in Pain?  No/denies                       [facility] Adult PT Treatment/Exercise - 05/19/18 0001      Neuro Re-ed    Neuro Re-ed Details   sitting on ball circels both ways; breathing with tband for cues; breathing in supine with towel roll for thoracic extension and using cane for increased flexion ext - activation of TrA on exhale - xome increase pain in RLQ with TrA contraction      Exercises   Exercises  Lumbar      Lumbar Exercises: Stretches   Hip Flexor Stretch  2  reps;Right;Left;20 seconds   felt a little pain on Lt when stretching the Rt LE   Press Ups  5 reps;10 seconds      Lumbar Exercises: Prone   Other Prone Lumbar Exercises  press up on elbows with breathing - 5x 10sec      Manual Therapy   Manual therapy comments  thoracic and lumbar paraspinals,glutes             PT Education - 05/19/18 1117    Education Details  Access Code: [email or web address]    Person(s) Educated  Patient    Methods  Explanation;Handout;Verbal cues;Tactile cues;Demonstration    Comprehension  Verbalized understanding;Returned demonstration       PT Short Term Goals - 05/12/18 1204      PT SHORT TERM GOAL #2   Title  Pt will report 20% less pain throughout the day    Baseline  noticing it feels a little better    Status  On-going        PT Long Term Goals - 04/24/18 1710      PT LONG TERM GOAL #1   Title  Pt will be ind with advanced HEP    Time  12    Period  Weeks    Status  New    Target Date  07/17/18      PT LONG TERM GOAL #2   Title  Pt will demonstrate hip adduction at least 4+/5 MMT bialterally for improved functional activities such as squatting    Time  12    Period  Weeks    Status  New    Target Date  07/17/18      PT LONG TERM GOAL #3   Title  Pt will report 1/3 Marinoff scale     Baseline  3/3    Time  12    Period  Weeks    Status  New    Target Date  07/17/18      PT LONG TERM GOAL #4   Title  Pt will report at least 70% less pressure in the vagina during typical daily activities.    Time  12    Period  Weeks    Status  New    Target Date  07/17/18            Plan - 05/19/18 1118    Clinical Impression Statement  Pt needed a lot of verbal and tactile cues for coordination of TrA and diaphragm with breathing.  She had some soreness when engaging her TrA muscle and needed cues to decreased level of muscle activation.  Pt has tension along left side and into glutes in the back.  She responsed well to manual soft tissue  release.  Pt continue to need skilled PT to work on muscle coordination.  Still very tender to palpation of hip flexors and abdomen.      PT Treatment/Interventions  ADLs/Self Care Home Management;Biofeedback;Cryotherapy;Electrical Stimulation;Moist Heat;Therapeutic activities;Therapeutic exercise;Neuromuscular re-education;Patient/family education;Manual techniques;Passive range of motion;Dry needling;Taping    PT Next Visit Plan  thoracic rotation and sitting on ball, internal and external STM to pelvic floor, f/u on adding press ups    PT Home Exercise Plan   Access Code: [email or web address]     Recommended Other Services  1/32 cert signed    Consulted and Agree with Plan of Care  Patient       Patient will benefit from skilled therapeutic intervention in order to improve the following deficits and impairments:  Pain, Increased fascial restricitons, Postural dysfunction, Decreased strength, Decreased endurance, Decreased range of motion, Impaired flexibility, Decreased scar mobility, Decreased coordination  Visit Diagnosis: Cramp and spasm  Muscle weakness (generalized)     Problem List Patient Active Problem List   Diagnosis Date Noted  . Pterygium of left eye 03/01/2018  . Chronic left shoulder pain 04/19/2017  . Chronic back pain greater than 3 months duration 04/19/2017  . Dyslipidemia 04/19/2017  . Hypothyroidism 04/07/2017  . HLD (hyperlipidemia) 02/06/2015  . Dyspareunia 05/20/2014  . Unspecified constipation 08/06/2013  . Onychomycosis 08/06/2013  . Hemorrhoid 08/06/2013  . GERD (gastroesophageal reflux disease) 08/06/2013  . Muscle ache 08/06/2013  . LOW BACK PAIN, MILD 03/02/2007  . HYPERGLYCEMIA 11/03/2006    Vincente Poli, PT 05/19/2018, 11:32 AM  [facility] [address], [address] [place], [place], [address] Phone: [phone]   Fax:  [phone]  Name: Courtney Edwards MRN: [id number] Date of  Birth: 23-Apr-1976

## 2018-05-22 ENCOUNTER — Encounter: Payer: Self-pay | Admitting: Physical Therapy

## 2018-05-22 ENCOUNTER — Ambulatory Visit: Payer: Self-pay | Admitting: Physical Therapy

## 2018-05-22 DIAGNOSIS — R252 Cramp and spasm: Secondary | ICD-10-CM

## 2018-05-22 DIAGNOSIS — M6281 Muscle weakness (generalized): Secondary | ICD-10-CM

## 2018-05-22 NOTE — Patient Instructions (Signed)
Access Code: [email or web address]  URL: [email or web address]  Date: 05/22/2018  Prepared by: Dorie Rank   Exercises  Supine Diaphragmatic Breathing - 10 reps - 1 sets - 3x daily - 7x weekly  Supine Figure 4 Piriformis Stretch - 3 reps - 1 sets - 30 sec hold - 1x daily - 7x weekly  Supine Hamstring Stretch - 3 reps - 1 sets - 30 sec hold - 1x daily - 7x weekly  Supine Piriformis Stretch with Foot on Ground - 3 reps - 1 sets - 30 sec hold - 1x daily - 7x weekly  Supine Hip Adductor Stretch - 3 reps - 1 sets - 30 sec hold - 1x daily - 7x weekly  Thoracic Extension Mobilization on Foam Roll - 10 reps - 1 sets - 5 sec hold - 1x daily - 7x weekly  Prone Press Up on Elbows - 5 reps - 1 sets - 10 sec hold - 1x daily - 7x weekly  Sidelying Thoracic Rotation with Open Book - 3 reps - 1 sets - 30 hold - 1x daily - 7x weekly

## 2018-05-22 NOTE — Therapy (Signed)
[facility] [address] [place], [place], [address] Phone: [phone]   Fax:  [phone]  Physical Therapy Treatment  Patient Details  Name: Courtney Edwards MRN: [id number] Date of Birth: 1976/04/28 Referring Provider (PT): Claiborne Rigg   Encounter Date: 05/22/2018  PT End of Session - 05/22/18 0758    Visit Number  7    Date for PT Re-Evaluation  07/17/18    PT Start Time  0758    PT Stop Time  0844    PT Time Calculation (min)  46 min    Activity Tolerance  Patient tolerated treatment well;Patient limited by pain    Behavior During Therapy  [facility] for tasks assessed/performed       Past Medical History:  Diagnosis Date  . Hypothyroidism   . Low blood pressure   . Medical history non-contributory   . Mental disorder   . Post partum depression 2002    Past Surgical History:  Procedure Laterality Date  . LAPAROSCOPY N/A 02/09/2017   Procedure: LAPAROSCOPY DIAGNOSTIC;  Surgeon: Allie Bossier, MD;  Location: [facility];  Service: Gynecology;  Laterality: N/A;  . NO PAST SURGERIES      There were no vitals filed for this visit.  Subjective Assessment - 05/22/18 0759    Subjective  Pt states she is feeling 80% better.  Still has pain with intercourse    Patient Stated Goals  get rid of pain    Currently in Pain?  No/denies                       [facility] Adult PT Treatment/Exercise - 05/22/18 0001      Lumbar Exercises: Prone   Other Prone Lumbar Exercises  sidelying - thoracic rotation with breathing - 6x each side      Manual Therapy   Manual Therapy  Soft tissue mobilization;Myofascial release    Soft tissue mobilization  STM to external pelvic floor and adductors    Myofascial Release  thoracic and abdominal diaphragms with one hand anterior and one hand posterior - 6 fascial planes             PT Education - 05/22/18 0849    Education Details   Access Code:  [email or web address]     Person(s) Educated  Patient    Methods  Explanation;Demonstration;Handout;Verbal cues    Comprehension  Verbalized understanding;Returned demonstration       PT Short Term Goals - 05/12/18 1204      PT SHORT TERM GOAL #2   Title  Pt will report 20% less pain throughout the day    Baseline  noticing it feels a little better    Status  On-going        PT Long Term Goals - 05/22/18 0805      PT LONG TERM GOAL #1   Title  Pt will be ind with advanced HEP    Status  On-going      PT LONG TERM GOAL #2   Title  Pt will demonstrate hip adduction at least 4+/5 MMT bialterally for improved functional activities such as squatting    Status  On-going      PT LONG TERM GOAL #3   Title  Pt will report 1/3 Marinoff scale     Baseline  better      PT LONG TERM GOAL #4   Title  Pt will report at least 70% less pressure in  the vagina during typical daily activities.    Baseline  reports she is 80% improved overall    Status  On-going            Plan - 05/22/18 1244    Clinical Impression Statement  Pt had some fascial release with thoracic diaphragm myofascial to both sides but with tenderness even to light pressure.  Pt is 80% improved per her reports.  She was able to tolerate pelvic floor STM externally today.  Pt will benefit from skilled PT to continue working on improved thoracic mobility and ability to coordinate pelvic and thoracic diaphragms.      PT Treatment/Interventions  ADLs/Self Care Home Management;Biofeedback;Cryotherapy;Electrical Stimulation;Moist Heat;Therapeutic activities;Therapeutic exercise;Neuromuscular re-education;Patient/family education;Manual techniques;Passive range of motion;Dry needling;Taping    PT Next Visit Plan  biofeedback, thoracic rotation and sitting on ball, internal and external STM to pelvic floor, f/u on adding press ups    PT Home Exercise Plan   Access Code: [email or web address]     Consulted and Agree with Plan of Care  Patient        Patient will benefit from skilled therapeutic intervention in order to improve the following deficits and impairments:  Pain, Increased fascial restricitons, Postural dysfunction, Decreased strength, Decreased endurance, Decreased range of motion, Impaired flexibility, Decreased scar mobility, Decreased coordination  Visit Diagnosis: Cramp and spasm  Muscle weakness (generalized)     Problem List Patient Active Problem List   Diagnosis Date Noted  . Pterygium of left eye 03/01/2018  . Chronic left shoulder pain 04/19/2017  . Chronic back pain greater than 3 months duration 04/19/2017  . Dyslipidemia 04/19/2017  . Hypothyroidism 04/07/2017  . HLD (hyperlipidemia) 02/06/2015  . Dyspareunia 05/20/2014  . Unspecified constipation 08/06/2013  . Onychomycosis 08/06/2013  . Hemorrhoid 08/06/2013  . GERD (gastroesophageal reflux disease) 08/06/2013  . Muscle ache 08/06/2013  . LOW BACK PAIN, MILD 03/02/2007  . HYPERGLYCEMIA 11/03/2006    Vincente Poli, PT 05/22/2018, 12:54 PM  [facility] [address], [address] [place], [place], [address] Phone: [phone]   Fax:  [phone]  Name: Courtney Edwards MRN: [id number] Date of Birth: [birth year]

## 2018-06-02 ENCOUNTER — Ambulatory Visit: Payer: Self-pay | Admitting: Physical Therapy

## 2018-06-02 ENCOUNTER — Encounter: Payer: Self-pay | Admitting: Physical Therapy

## 2018-06-02 DIAGNOSIS — R252 Cramp and spasm: Secondary | ICD-10-CM

## 2018-06-02 DIAGNOSIS — M6281 Muscle weakness (generalized): Secondary | ICD-10-CM

## 2018-06-03 NOTE — Therapy (Signed)
[facility] [address] [place], [place], [address] Phone: [phone]   Fax:  [phone]  Physical Therapy Treatment  Patient Details  Name: Courtney Edwards MRN: [id number] Date of Birth: 24-Apr-1976 Referring Provider (PT): Claiborne Rigg   Encounter Date: 06/02/2018  PT End of Session - 06/02/18 0801    Visit Number  8    Date for PT Re-Evaluation  07/17/18    PT Start Time  0801    PT Stop Time  0849    PT Time Calculation (min)  48 min    Activity Tolerance  Patient tolerated treatment well;Patient limited by pain    Behavior During Therapy  [facility] for tasks assessed/performed       Past Medical History:  Diagnosis Date  . Hypothyroidism   . Low blood pressure   . Medical history non-contributory   . Mental disorder   . Post partum depression 2002    Past Surgical History:  Procedure Laterality Date  . LAPAROSCOPY N/A 02/09/2017   Procedure: LAPAROSCOPY DIAGNOSTIC;  Surgeon: Allie Bossier, MD;  Location: [facility];  Service: Gynecology;  Laterality: N/A;  . NO PAST SURGERIES      There were no vitals filed for this visit.  Subjective Assessment - 06/02/18 0801    Subjective  I was very tired after last session and had a lot of pain the next.  Pt had intercourse 1x and pain after not during.  I had a lot of fullness in my stomach and felt constipated.    Patient is accompained by:  Interpreter    Patient Stated Goals  get rid of pain    Currently in Pain?  Yes    Pain Score  3     Pain Location  Vagina    Pain Orientation  Mid    Pain Descriptors / Indicators  Pressure    Pain Type  Chronic pain    Pain Radiating Towards  low back; low abdomen    Pain Onset  More than a month ago    Pain Frequency  Intermittent    Aggravating Factors   intercourse    Multiple Pain Sites  No                       [facility] Adult PT Treatment/Exercise - 06/03/18 0001      Self-Care   Other Self-Care Comments   educated on breathing and relaxation techniques after intercourse to reduce muscle spasms      Manual Therapy   Myofascial Release  respiratory, pelvic, upper thoracic and occipital diaphragm releases, traction to sacrum and occipatl               PT Short Term Goals - 05/12/18 1204      PT SHORT TERM GOAL #2   Title  Pt will report 20% less pain throughout the day    Baseline  noticing it feels a little better    Status  On-going        PT Long Term Goals - 05/22/18 0805      PT LONG TERM GOAL #1   Title  Pt will be ind with advanced HEP    Status  On-going      PT LONG TERM GOAL #2   Title  Pt will demonstrate hip adduction at least 4+/5 MMT bialterally for improved functional activities such as squatting    Status  On-going  PT LONG TERM GOAL #3   Title  Pt will report 1/3 Marinoff scale     Baseline  better      PT LONG TERM GOAL #4   Title  Pt will report at least 70% less pressure in the vagina during typical daily activities.    Baseline  reports she is 80% improved overall    Status  On-going            Plan - 06/02/18 0917    Clinical Impression Statement  Pt presented with increased pain today.  She had difficulty with diaphragmatic breathing.  She had some improvement after manual fascial release.  Pt had extensive education on importance of doing relaxation and breathing techniques.  She continues to have spasming and hypersensativity in pelvic floor muscles with difficutly relaxing after contraction.    PT Treatment/Interventions  ADLs/Self Care Home Management;Biofeedback;Cryotherapy;Electrical Stimulation;Moist Heat;Therapeutic activities;Therapeutic exercise;Neuromuscular re-education;Patient/family education;Manual techniques;Passive range of motion;Dry needling;Taping    PT Next Visit Plan  biofeedback for downtraining    PT Home Exercise Plan   Access Code: [email or web address]     Consulted and Agree with Plan of Care   Patient       Patient will benefit from skilled therapeutic intervention in order to improve the following deficits and impairments:  Pain, Increased fascial restricitons, Postural dysfunction, Decreased strength, Decreased endurance, Decreased range of motion, Impaired flexibility, Decreased scar mobility, Decreased coordination  Visit Diagnosis: Cramp and spasm  Muscle weakness (generalized)     Problem List Patient Active Problem List   Diagnosis Date Noted  . Pterygium of left eye 03/01/2018  . Chronic left shoulder pain 04/19/2017  . Chronic back pain greater than 3 months duration 04/19/2017  . Dyslipidemia 04/19/2017  . Hypothyroidism 04/07/2017  . HLD (hyperlipidemia) 02/06/2015  . Dyspareunia 05/20/2014  . Unspecified constipation 08/06/2013  . Onychomycosis 08/06/2013  . Hemorrhoid 08/06/2013  . GERD (gastroesophageal reflux disease) 08/06/2013  . Muscle ache 08/06/2013  . LOW BACK PAIN, MILD 03/02/2007  . HYPERGLYCEMIA 11/03/2006    Vincente Poli 06/03/2018, 9:50 AM   [facility] [address] [place], [place], [address] Phone: [phone]   Fax:  [phone]  Name: Adamary Savary MRN: [id number] Date of Birth: 11/12/1975

## 2018-06-05 ENCOUNTER — Encounter: Payer: Self-pay | Admitting: Physical Therapy

## 2018-06-05 ENCOUNTER — Ambulatory Visit: Payer: Self-pay | Admitting: Physical Therapy

## 2018-06-05 DIAGNOSIS — M6281 Muscle weakness (generalized): Secondary | ICD-10-CM

## 2018-06-05 DIAGNOSIS — R252 Cramp and spasm: Secondary | ICD-10-CM

## 2018-06-05 NOTE — Patient Instructions (Signed)
Access Code: [email or web address]  URL: [email or web address]  Date: 06/05/2018  Prepared by: Dorie Rank   Exercises  Supine Diaphragmatic Breathing - 10 reps - 1 sets - 3x daily - 7x weekly  Supine Figure 4 Piriformis Stretch - 3 reps - 1 sets - 30 sec hold - 1x daily - 7x weekly  Supine Hamstring Stretch - 3 reps - 1 sets - 30 sec hold - 1x daily - 7x weekly  Supine Piriformis Stretch with Foot on Ground - 3 reps - 1 sets - 30 sec hold - 1x daily - 7x weekly  Supine Hip Adductor Stretch - 3 reps - 1 sets - 30 sec hold - 1x daily - 7x weekly  Thoracic Extension Mobilization on Foam Roll - 10 reps - 1 sets - 5 sec hold - 1x daily - 7x weekly  Prone Press Up on Elbows - 5 reps - 1 sets - 10 sec hold - 1x daily - 7x weekly  Sidelying Thoracic Rotation with Open Book - 3 reps - 1 sets - 30 hold - 1x daily - 7x weekly  Supine Bilateral Hip Internal Rotation Stretch - 3 reps - 1 sets - 30 sec hold - 1x daily - 7x weekly

## 2018-06-05 NOTE — Therapy (Signed)
[facility] [address] [place], [place], [address] Phone: [phone]   Fax:  [phone]  Physical Therapy Treatment  Patient Details  Name: Courtney Edwards MRN: [id number] Date of Birth: May 17, 1976 Referring Provider (PT): Claiborne Rigg   Encounter Date: 06/05/2018  PT End of Session - 06/05/18 0815    Visit Number  9    Date for PT Re-Evaluation  07/17/18    PT Start Time  0804    PT Stop Time  0845    PT Time Calculation (min)  41 min    Activity Tolerance  Patient tolerated treatment well;Patient limited by pain    Behavior During Therapy  [facility] for tasks assessed/performed       Past Medical History:  Diagnosis Date  . Hypothyroidism   . Low blood pressure   . Medical history non-contributory   . Mental disorder   . Post partum depression 2002    Past Surgical History:  Procedure Laterality Date  . LAPAROSCOPY N/A 02/09/2017   Procedure: LAPAROSCOPY DIAGNOSTIC;  Surgeon: Allie Bossier, MD;  Location: [facility];  Service: Gynecology;  Laterality: N/A;  . NO PAST SURGERIES      There were no vitals filed for this visit.  Subjective Assessment - 06/05/18 0816    Subjective  I feel better than last time.  No pain today    Patient Stated Goals  get rid of pain    Currently in Pain?  No/denies                       [facility] Adult PT Treatment/Exercise - 06/05/18 0001      Neuro Re-ed    Neuro Re-ed Details   biofeedback with stretching and working on contract/relax of pelvic floor - able to relax to about 5mV      Exercises   Exercises  Lumbar      Lumbar Exercises: Stretches   Piriformis Stretch  5 reps;10 seconds;Right;Left    Other Lumbar Stretch Exercise  butterfly stretch with breathing    Other Lumbar Stretch Exercise  hip internal rotation stretch - 3 x 30 sec      Lumbar Exercises: Supine   Ab Set  10 reps    Other Supine Lumbar Exercises  pelvic floor  kegel - 4 x 10 sec hold      Manual Therapy   Soft tissue mobilization  TFL and quads Lt LE             PT Education - 06/05/18 0847    Education Details   Access Code: [email or web address]     Person(s) Educated  Patient    Methods  Explanation;Demonstration;Handout;Verbal cues    Comprehension  Verbalized understanding;Returned demonstration       PT Short Term Goals - 05/12/18 1204      PT SHORT TERM GOAL #2   Title  Pt will report 20% less pain throughout the day    Baseline  noticing it feels a little better    Status  On-going        PT Long Term Goals - 05/22/18 0805      PT LONG TERM GOAL #1   Title  Pt will be ind with advanced HEP    Status  On-going      PT LONG TERM GOAL #2   Title  Pt will demonstrate hip adduction at least 4+/5 MMT bialterally for improved functional activities such  as squatting    Status  On-going      PT LONG TERM GOAL #3   Title  Pt will report 1/3 Marinoff scale     Baseline  better      PT LONG TERM GOAL #4   Title  Pt will report at least 70% less pressure in the vagina during typical daily activities.    Baseline  reports she is 80% improved overall    Status  On-going            Plan - 06/05/18 0847    Clinical Impression Statement  Pt was able to relax down to 6-108mV with cues.  Pt had fatigue after 4 reps of pelvic floor contraciton and some pain when activating TrA.  Pt has muscle spasm Lt TFL/upper quads.  Pt responded well to soft tissue release.  Pt continues to need skilled PT to improve resting tone and ability to engage TrA without pain.      PT Treatment/Interventions  ADLs/Self Care Home Management;Biofeedback;Cryotherapy;Electrical Stimulation;Moist Heat;Therapeutic activities;Therapeutic exercise;Neuromuscular re-education;Patient/family education;Manual techniques;Passive range of motion;Dry needling;Taping    PT Next Visit Plan  f/u on STM to Lt hip, biofeedback for downtraining, breathing with bulging, stretch to Lt  TFL and internal rotation stretch, abdominal fascial release, internal STM if tolerated    PT Home Exercise Plan   Access Code: [email or web address]     Consulted and Agree with Plan of Care  Patient       Patient will benefit from skilled therapeutic intervention in order to improve the following deficits and impairments:  Pain, Increased fascial restricitons, Postural dysfunction, Decreased strength, Decreased endurance, Decreased range of motion, Impaired flexibility, Decreased scar mobility, Decreased coordination  Visit Diagnosis: Cramp and spasm  Muscle weakness (generalized)     Problem List Patient Active Problem List   Diagnosis Date Noted  . Pterygium of left eye 03/01/2018  . Chronic left shoulder pain 04/19/2017  . Chronic back pain greater than 3 months duration 04/19/2017  . Dyslipidemia 04/19/2017  . Hypothyroidism 04/07/2017  . HLD (hyperlipidemia) 02/06/2015  . Dyspareunia 05/20/2014  . Unspecified constipation 08/06/2013  . Onychomycosis 08/06/2013  . Hemorrhoid 08/06/2013  . GERD (gastroesophageal reflux disease) 08/06/2013  . Muscle ache 08/06/2013  . LOW BACK PAIN, MILD 03/02/2007  . HYPERGLYCEMIA 11/03/2006    Vincente Poli, PT 06/05/2018, 5:27 PM  [facility] [address], [address] [place], [place], [address] Phone: [phone]   Fax:  [phone]  Name: Kalesha Irving MRN: [id number] Date of Birth: [birth year]

## 2018-06-09 ENCOUNTER — Ambulatory Visit: Payer: Self-pay | Admitting: Physical Therapy

## 2018-06-09 ENCOUNTER — Encounter: Payer: Self-pay | Admitting: Physical Therapy

## 2018-06-09 DIAGNOSIS — M6281 Muscle weakness (generalized): Secondary | ICD-10-CM

## 2018-06-09 DIAGNOSIS — R252 Cramp and spasm: Secondary | ICD-10-CM

## 2018-06-09 NOTE — Therapy (Signed)
[facility] [address] [place], [place], [address] Phone: [phone]   Fax:  [phone]  Physical Therapy Treatment  Patient Details  Name: Courtney Edwards MRN: [id number] Date of Birth: 09-18-1975 Referring Provider (PT): Gildardo Pounds   Encounter Date: 06/09/2018  PT End of Session - 06/09/18 0803    Visit Number  10    Date for PT Re-Evaluation  07/17/18    PT Start Time  0802    PT Stop Time  0845    PT Time Calculation (min)  43 min    Activity Tolerance  Patient tolerated treatment well;Patient limited by pain    Behavior During Therapy  [facility] for tasks assessed/performed       Past Medical History:  Diagnosis Date  . Hypothyroidism   . Low blood pressure   . Medical history non-contributory   . Mental disorder   . Post partum depression 2002    Past Surgical History:  Procedure Laterality Date  . LAPAROSCOPY N/A 02/09/2017   Procedure: LAPAROSCOPY DIAGNOSTIC;  Surgeon: Emily Filbert, MD;  Location: [facility];  Service: Gynecology;  Laterality: N/A;  . NO PAST SURGERIES      There were no vitals filed for this visit.  Subjective Assessment - 06/09/18 0801    Subjective  Pt states she feels good, no pain since previous session.  Hip feels better after last time.    Patient Stated Goals  get rid of pain    Currently in Pain?  No/denies                       [facility] Adult PT Treatment/Exercise - 06/09/18 0001      Neuro Re-ed    Neuro Re-ed Details   sitting on ball circles and bouncing for tactile feedback to relax pelvic floor      Exercises   Exercises  Lumbar      Lumbar Exercises: Stretches   Lower Trunk Rotation  --   on ball - 10x each side   Hip Flexor Stretch  3 reps;20 seconds      Lumbar Exercises: Supine   Bent Knee Raise  20 reps    Large Ball Abdominal Isometric  20 reps      Lumbar Exercises: Sidelying   Clam  Both;20 reps      Manual  Therapy   Manual Therapy  Muscle Energy Technique    Soft tissue mobilization  TFL and quads Lt LE, hip flexors    Myofascial Release  rolling with spiky ball to lateral hip and and quads, TFL    Muscle Energy Technique  2x20 sec to correct ant rotation of Rt ilium; isometric abd/add               PT Short Term Goals - 05/12/18 1204      PT SHORT TERM GOAL #2   Title  Pt will report 20% less pain throughout the day    Baseline  noticing it feels a little better    Status  On-going        PT Long Term Goals - 05/22/18 0805      PT LONG TERM GOAL #1   Title  Pt will be ind with advanced HEP    Status  On-going      PT LONG TERM GOAL #2   Title  Pt will demonstrate hip adduction at least 4+/5 MMT bialterally for improved functional  activities such as squatting    Status  On-going      PT LONG TERM GOAL #3   Title  Pt will report 1/3 Marinoff scale     Baseline  better      PT LONG TERM GOAL #4   Title  Pt will report at least 70% less pressure in the vagina during typical daily activities.    Baseline  reports she is 80% improved overall    Status  On-going            Plan - 06/09/18 0844    Clinical Impression Statement  Pt had anterior rotation of Rt ilium and was corrected with MET.  Pt is able to take more pressure to work on tightness in hip flexors and Lt quad and TFL with less tenderness overall.  Pt di dwell with additon of core strengthening exercises.  She will benefit from skilled PT to progress strength and return to greater function.    PT Treatment/Interventions  ADLs/Self Care Home Management;Biofeedback;Cryotherapy;Electrical Stimulation;Moist Heat;Therapeutic activities;Therapeutic exercise;Neuromuscular re-education;Patient/family education;Manual techniques;Passive range of motion;Dry needling;Taping    PT Next Visit Plan  reassess Rt ant rotation, core and pelvic strengthening with stretching, STM as needed, biofeedback re-assess    PT Home  Exercise Plan   Access Code: [email or web address]     Consulted and Agree with Plan of Care  Patient       Patient will benefit from skilled therapeutic intervention in order to improve the following deficits and impairments:  Pain, Increased fascial restricitons, Postural dysfunction, Decreased strength, Decreased endurance, Decreased range of motion, Impaired flexibility, Decreased scar mobility, Decreased coordination  Visit Diagnosis: Cramp and spasm  Muscle weakness (generalized)     Problem List Patient Active Problem List   Diagnosis Date Noted  . Pterygium of left eye 03/01/2018  . Chronic left shoulder pain 04/19/2017  . Chronic back pain greater than 3 months duration 04/19/2017  . Dyslipidemia 04/19/2017  . Hypothyroidism 04/07/2017  . HLD (hyperlipidemia) 02/06/2015  . Dyspareunia 05/20/2014  . Unspecified constipation 08/06/2013  . Onychomycosis 08/06/2013  . Hemorrhoid 08/06/2013  . GERD (gastroesophageal reflux disease) 08/06/2013  . Muscle ache 08/06/2013  . LOW BACK PAIN, MILD 03/02/2007  . HYPERGLYCEMIA 11/03/2006    Zannie Cove, PT 06/09/2018, 12:19 PM  [facility] [address] [place], [place], [address] Phone: [phone]   Fax:  [phone]  Name: Cindel Daugherty MRN: [id number] Date of Birth: 05-14-1976

## 2018-06-13 ENCOUNTER — Ambulatory Visit: Payer: Self-pay | Admitting: Physical Therapy

## 2018-06-13 ENCOUNTER — Encounter: Payer: Self-pay | Admitting: Physical Therapy

## 2018-06-13 DIAGNOSIS — R252 Cramp and spasm: Secondary | ICD-10-CM

## 2018-06-13 DIAGNOSIS — M6281 Muscle weakness (generalized): Secondary | ICD-10-CM

## 2018-06-13 NOTE — Therapy (Signed)
[facility] [address] [place], [place], [address] Phone: [phone]   Fax:  [phone]  Physical Therapy Treatment  Patient Details  Name: Courtney Edwards MRN: [id number] Date of Birth: 1976/04/06 Referring Provider (PT): Claiborne Rigg   Encounter Date: 06/13/2018  PT End of Session - 06/13/18 0757    Visit Number  11    Date for PT Re-Evaluation  07/17/18    PT Start Time  0757    PT Stop Time  0840    PT Time Calculation (min)  43 min    Activity Tolerance  Patient tolerated treatment well;Patient limited by pain    Behavior During Therapy  [facility] for tasks assessed/performed       Past Medical History:  Diagnosis Date  . Hypothyroidism   . Low blood pressure   . Medical history non-contributory   . Mental disorder   . Post partum depression 2002    Past Surgical History:  Procedure Laterality Date  . LAPAROSCOPY N/A 02/09/2017   Procedure: LAPAROSCOPY DIAGNOSTIC;  Surgeon: Allie Bossier, MD;  Location: [facility];  Service: Gynecology;  Laterality: N/A;  . NO PAST SURGERIES      There were no vitals filed for this visit.  Subjective Assessment - 06/13/18 0758    Subjective  Pt reports feeling good today    Patient Stated Goals  get rid of pain    Currently in Pain?  No/denies         [facility] PT Assessment - 06/13/18 0001      Palpation   SI assessment   normal                   [facility] Adult PT Treatment/Exercise - 06/13/18 0001      Neuro Re-ed    Neuro Re-ed Details   sitting on ball circles and bouncing for tactile feedback to relax pelvic floor      Exercises   Exercises  Lumbar      Lumbar Exercises: Stretches   Lower Trunk Rotation  5 reps   on red ball   Hip Flexor Stretch  3 reps;20 seconds    Quad Stretch  3 reps;20 seconds;Right;Left      Lumbar Exercises: Standing   Shoulder Extension  Strengthening;Both;20 reps;Theraband    Theraband Level  (Shoulder Extension)  Level 3 (Green)    Other Standing Lumbar Exercises  wall slide - with red band around knees 10x ; horizontal abduction red band 10x    Other Standing Lumbar Exercises  side step and hip ext red band around knees - 10x each      Lumbar Exercises: Supine   Bent Knee Raise  20 reps    Large Ball Abdominal Isometric  20 reps      Manual Therapy   Soft tissue mobilization  bilat hip flexors               PT Short Term Goals - 05/12/18 1204      PT SHORT TERM GOAL #2   Title  Pt will report 20% less pain throughout the day    Baseline  noticing it feels a little better    Status  On-going        PT Long Term Goals - 05/22/18 0805      PT LONG TERM GOAL #1   Title  Pt will be ind with advanced HEP    Status  On-going  PT LONG TERM GOAL #2   Title  Pt will demonstrate hip adduction at least 4+/5 MMT bialterally for improved functional activities such as squatting    Status  On-going      PT LONG TERM GOAL #3   Title  Pt will report 1/3 Marinoff scale     Baseline  better      PT LONG TERM GOAL #4   Title  Pt will report at least 70% less pressure in the vagina during typical daily activities.    Baseline  reports she is 80% improved overall    Status  On-going            Plan - 06/13/18 0834    Clinical Impression Statement  Pt had good pelvic alignment.  She was able to tolerate more stengthening. She continues to have very tight and tender hip flexors.  Pt will benefit from skilled PT to continue strengthening.core and pelvic floor.      PT Treatment/Interventions  ADLs/Self Care Home Management;Biofeedback;Cryotherapy;Electrical Stimulation;Moist Heat;Therapeutic activities;Therapeutic exercise;Neuromuscular re-education;Patient/family education;Manual techniques;Passive range of motion;Dry needling;Taping    PT Next Visit Plan  progress core and pelvic strengthening with stretching, STM as needed, biofeedback re-assess    PT Home  Exercise Plan   Access Code: [email or web address]     Consulted and Agree with Plan of Care  Patient       Patient will benefit from skilled therapeutic intervention in order to improve the following deficits and impairments:  Pain, Increased fascial restricitons, Postural dysfunction, Decreased strength, Decreased endurance, Decreased range of motion, Impaired flexibility, Decreased scar mobility, Decreased coordination  Visit Diagnosis: Cramp and spasm  Muscle weakness (generalized)     Problem List Patient Active Problem List   Diagnosis Date Noted  . Pterygium of left eye 03/01/2018  . Chronic left shoulder pain 04/19/2017  . Chronic back pain greater than 3 months duration 04/19/2017  . Dyslipidemia 04/19/2017  . Hypothyroidism 04/07/2017  . HLD (hyperlipidemia) 02/06/2015  . Dyspareunia 05/20/2014  . Unspecified constipation 08/06/2013  . Onychomycosis 08/06/2013  . Hemorrhoid 08/06/2013  . GERD (gastroesophageal reflux disease) 08/06/2013  . Muscle ache 08/06/2013  . LOW BACK PAIN, MILD 03/02/2007  . HYPERGLYCEMIA 11/03/2006    Vincente Poli, PT 06/13/2018, 8:38 AM   [facility] [address] [place], [place], [address] Phone: [phone]   Fax:  [phone]  Name: Courtney Edwards MRN: [id number] Date of Birth: [birth year]

## 2018-06-16 ENCOUNTER — Encounter

## 2018-06-21 ENCOUNTER — Ambulatory Visit: Payer: Self-pay | Attending: Nurse Practitioner | Admitting: Physical Therapy

## 2018-06-21 ENCOUNTER — Encounter: Payer: Self-pay | Admitting: Physical Therapy

## 2018-06-21 DIAGNOSIS — R252 Cramp and spasm: Secondary | ICD-10-CM | POA: Insufficient documentation

## 2018-06-21 DIAGNOSIS — M6281 Muscle weakness (generalized): Secondary | ICD-10-CM | POA: Insufficient documentation

## 2018-06-21 NOTE — Therapy (Signed)
[facility] [address], [address] [place], [place], [address] Phone: [phone]   Fax:  [phone]  Physical Therapy Treatment  Patient Details  Name: Courtney Edwards MRN: [id number] Date of Birth: [birth year] Referring Provider (PT): Claiborne Rigg   Encounter Date: 06/21/2018  PT End of Session - 06/21/18 0756    Visit Number  12    Date for PT Re-Evaluation  07/17/18    PT Start Time  0801    PT Stop Time  0844    PT Time Calculation (min)  43 min    Activity Tolerance  Patient tolerated treatment well;Patient limited by pain    Behavior During Therapy  [facility] for tasks assessed/performed       Past Medical History:  Diagnosis Date  . Hypothyroidism   . Low blood pressure   . Medical history non-contributory   . Mental disorder   . Post partum depression 2002    Past Surgical History:  Procedure Laterality Date  . LAPAROSCOPY N/A 02/09/2017   Procedure: LAPAROSCOPY DIAGNOSTIC;  Surgeon: Allie Bossier, MD;  Location: [facility];  Service: Gynecology;  Laterality: N/A;  . NO PAST SURGERIES      There were no vitals filed for this visit.  Subjective Assessment - 06/21/18 0756    Subjective  Pt states she has been good.  Denies pain today    Patient is accompained by:  Interpreter    Currently in Pain?  No/denies                       [facility] Adult PT Treatment/Exercise - 06/21/18 0001      Neuro Re-ed    Neuro Re-ed Details   sitting on ball circles and bouncing for tactile feedback to relax pelvic floor; sitting on ball chest press and diagonals with yellow ball 15x      Exercises   Exercises  Lumbar      Lumbar Exercises: Stretches   Hip Flexor Stretch  3 reps;20 seconds      Lumbar Exercises: Aerobic   Elliptical  5 min back/fwd - PT present to monirot      Lumbar Exercises: Standing   Other Standing Lumbar Exercises  walking with sports cord - 25# - fwd/back - 10x    Other  Standing Lumbar Exercises  hip flex, abd,ext red band around knees - 10x each      Manual Therapy   Soft tissue mobilization  bilat hip flexors, left thoracic paraspinals    Myofascial Release  respiratory diaphragm               PT Short Term Goals - 05/12/18 1204      PT SHORT TERM GOAL #2   Title  Pt will report 20% less pain throughout the day    Baseline  noticing it feels a little better    Status  On-going        PT Long Term Goals - 05/22/18 0805      PT LONG TERM GOAL #1   Title  Pt will be ind with advanced HEP    Status  On-going      PT LONG TERM GOAL #2   Title  Pt will demonstrate hip adduction at least 4+/5 MMT bialterally for improved functional activities such as squatting    Status  On-going      PT LONG TERM GOAL #3   Title  Pt will report  1/3 Marinoff scale     Baseline  better      PT LONG TERM GOAL #4   Title  Pt will report at least 70% less pressure in the vagina during typical daily activities.    Baseline  reports she is 80% improved overall    Status  On-going            Plan - 06/21/18 0756    PT Treatment/Interventions  ADLs/Self Care Home Management;Biofeedback;Cryotherapy;Electrical Stimulation;Moist Heat;Therapeutic activities;Therapeutic exercise;Neuromuscular re-education;Patient/family education;Manual techniques;Passive range of motion;Dry needling;Taping    PT Home Exercise Plan   Access Code: [email or web address]     Consulted and Agree with Plan of Care  Patient       Patient will benefit from skilled therapeutic intervention in order to improve the following deficits and impairments:  Pain, Increased fascial restricitons, Postural dysfunction, Decreased strength, Decreased endurance, Decreased range of motion, Impaired flexibility, Decreased scar mobility, Decreased coordination  Visit Diagnosis: Cramp and spasm  Muscle weakness (generalized)     Problem List Patient Active Problem List   Diagnosis Date Noted  .  Pterygium of left eye 03/01/2018  . Chronic left shoulder pain 04/19/2017  . Chronic back pain greater than 3 months duration 04/19/2017  . Dyslipidemia 04/19/2017  . Hypothyroidism 04/07/2017  . HLD (hyperlipidemia) 02/06/2015  . Dyspareunia 05/20/2014  . Unspecified constipation 08/06/2013  . Onychomycosis 08/06/2013  . Hemorrhoid 08/06/2013  . GERD (gastroesophageal reflux disease) 08/06/2013  . Muscle ache 08/06/2013  . LOW BACK PAIN, MILD 03/02/2007  . HYPERGLYCEMIA 11/03/2006    Vincente Poli 06/21/2018, 9:39 AM  [facility] [address], [address] [place], [place], [address] Phone: [phone]   Fax:  [phone]  Name: Courtney Edwards MRN: [id number] Date of Birth: [birth year]

## 2018-06-23 ENCOUNTER — Ambulatory Visit: Payer: Self-pay | Admitting: Physical Therapy

## 2018-06-23 DIAGNOSIS — R252 Cramp and spasm: Secondary | ICD-10-CM

## 2018-06-23 DIAGNOSIS — M6281 Muscle weakness (generalized): Secondary | ICD-10-CM

## 2018-06-23 NOTE — Therapy (Signed)
[facility] [address], [address] [place], [place], [address] Phone: [phone]   Fax:  [phone]  Physical Therapy Treatment  Patient Details  Name: Courtney Edwards MRN: [id number] Date of Birth: 13-Jun-1976 Referring Provider (PT): Claiborne Rigg   Encounter Date: 06/23/2018  PT End of Session - 06/23/18 1024    Visit Number  13    Date for PT Re-Evaluation  07/17/18    PT Start Time  1020    PT Stop Time  1107    PT Time Calculation (min)  47 min    Activity Tolerance  Patient tolerated treatment well;Patient limited by pain    Behavior During Therapy  [facility] for tasks assessed/performed       Past Medical History:  Diagnosis Date  . Hypothyroidism   . Low blood pressure   . Medical history non-contributory   . Mental disorder   . Post partum depression 2002    Past Surgical History:  Procedure Laterality Date  . LAPAROSCOPY N/A 02/09/2017   Procedure: LAPAROSCOPY DIAGNOSTIC;  Surgeon: Allie Bossier, MD;  Location: [facility];  Service: Gynecology;  Laterality: N/A;  . NO PAST SURGERIES      There were no vitals filed for this visit.  Subjective Assessment - 06/23/18 1023    Subjective  Pt states she is feeling less pain and feels the exercises are helping    Patient is accompained by:  Interpreter    Pertinent History  5 vaginal deliveries    Patient Stated Goals  get rid of pain    Currently in Pain?  No/denies                       [facility] Adult PT Treatment/Exercise - 06/23/18 0001      Neuro Re-ed    Neuro Re-ed Details   sitting on ball circles and bouncing for tactile feedback to relax pelvic floor; sitting on ball chest press and diagonals with red band 20      Exercises   Exercises  Lumbar      Lumbar Exercises: Stretches   Other Lumbar Stretch Exercise  child's pose with pillows      Lumbar Exercises: Standing   Wall Slides  10 reps;3 seconds   red band around  knees - cues to keep weight evenly distribut   Shoulder Extension  Strengthening;Both;20 reps;Theraband    Theraband Level (Shoulder Extension)  Level 3 (Green)    Other Standing Lumbar Exercises  hip flex, abd,ext red band around knees - 15x each      Lumbar Exercises: Seated   Long Arc Quad on [place]  Strengthening;Left;Right;20 reps    Other Seated Lumbar Exercises  shoulder abduction and diagonals on ball - cues for posture      Lumbar Exercises: Supine   Other Supine Lumbar Exercises  lying supine foam roll - pec stretch, bent knee drops, clams red band      Lumbar Exercises: Quadruped   Single Arm Raise  Right;Left;10 reps      Manual Therapy   Soft tissue mobilization  left hip flexor Lt, Lt quad, Rt thoracic paraspinals               PT Short Term Goals - 05/12/18 1204      PT SHORT TERM GOAL #2   Title  Pt will report 20% less pain throughout the day    Baseline  noticing it feels a little  better    Status  On-going        PT Long Term Goals - 05/22/18 0805      PT LONG TERM GOAL #1   Title  Pt will be ind with advanced HEP    Status  On-going      PT LONG TERM GOAL #2   Title  Pt will demonstrate hip adduction at least 4+/5 MMT bialterally for improved functional activities such as squatting    Status  On-going      PT LONG TERM GOAL #3   Title  Pt will report 1/3 Marinoff scale     Baseline  better      PT LONG TERM GOAL #4   Title  Pt will report at least 70% less pressure in the vagina during typical daily activities.    Baseline  reports she is 80% improved overall    Status  On-going            Plan - 06/23/18 2100    Clinical Impression Statement  Pt continues to progress with level of difficutly for core strengthening.  She is able to tolerate more pressure to hip flexors during manual STM.  Pt demonstrates improvements overall and states she is having minimal pain with intercourse at this time.  She continues to have some pain in  mid-upper back and that is where she feels the most pain now.  Pt has muscle spasms and trigger points in thoracic paraspinals.  Pt will benefit from skilled PT to porgress core strength    PT Treatment/Interventions  ADLs/Self Care Home Management;Biofeedback;Cryotherapy;Electrical Stimulation;Moist Heat;Therapeutic activities;Therapeutic exercise;Neuromuscular re-education;Patient/family education;Manual techniques;Passive range of motion;Dry needling;Taping    PT Next Visit Plan  start with thoracic fascial release and STM, thoracic flexion with rotation, progress core and pelvic strengthening with stretching, STM as needed, biofeedback re-assess    PT Home Exercise Plan   Access Code: [email or web address]     Consulted and Agree with Plan of Care  Patient       Patient will benefit from skilled therapeutic intervention in order to improve the following deficits and impairments:  Pain, Increased fascial restricitons, Postural dysfunction, Decreased strength, Decreased endurance, Decreased range of motion, Impaired flexibility, Decreased scar mobility, Decreased coordination  Visit Diagnosis: Cramp and spasm  Muscle weakness (generalized)     Problem List Patient Active Problem List   Diagnosis Date Noted  . Pterygium of left eye 03/01/2018  . Chronic left shoulder pain 04/19/2017  . Chronic back pain greater than 3 months duration 04/19/2017  . Dyslipidemia 04/19/2017  . Hypothyroidism 04/07/2017  . HLD (hyperlipidemia) 02/06/2015  . Dyspareunia 05/20/2014  . Unspecified constipation 08/06/2013  . Onychomycosis 08/06/2013  . Hemorrhoid 08/06/2013  . GERD (gastroesophageal reflux disease) 08/06/2013  . Muscle ache 08/06/2013  . LOW BACK PAIN, MILD 03/02/2007  . HYPERGLYCEMIA 11/03/2006    Vincente Poli, PT 06/23/2018, 9:06 PM  [facility] [address] [place], [place], [address] Phone: [phone]   Fax:   [phone]  Name: Courtney Edwards MRN: [id number] Date of Birth: 03-04-1976

## 2018-06-26 MED FILL — ?LEVOTHYROXINE 25 MCG TABLE: 25 | 30 days supply | Qty: 30 | Fill #2

## 2018-06-26 MED FILL — ?ATORVASTATIN 40MG TABLET: 40 | 30 days supply | Qty: 30 | Fill #3

## 2018-07-03 ENCOUNTER — Encounter: Payer: Self-pay | Admitting: Physical Therapy

## 2018-07-03 ENCOUNTER — Ambulatory Visit: Payer: Self-pay | Admitting: Physical Therapy

## 2018-07-03 DIAGNOSIS — M6281 Muscle weakness (generalized): Secondary | ICD-10-CM

## 2018-07-03 DIAGNOSIS — R252 Cramp and spasm: Secondary | ICD-10-CM

## 2018-07-03 NOTE — Therapy (Signed)
[facility] [address] [place], [place], [address] Phone: [phone]   Fax:  [phone]  Physical Therapy Treatment  Patient Details  Name: Courtney Edwards MRN: [id number] Date of Birth: 12/01/1975 Referring Provider (PT): Fleming, Zelda W   Encounter Date: 07/03/2018  PT End of Session - 07/03/18 0758    Visit Number  14    Date for PT Re-Evaluation  07/17/18    PT Start Time  0758    PT Stop Time  0840    PT Time Calculation (min)  42 min    Activity Tolerance  Patient tolerated treatment well    Behavior During Therapy  [facility] for tasks assessed/performed       Past Medical History:  Diagnosis Date  . Hypothyroidism   . Low blood pressure   . Medical history non-contributory   . Mental disorder   . Post partum depression 2002    Past Surgical History:  Procedure Laterality Date  . LAPAROSCOPY N/A 02/09/2017   Procedure: LAPAROSCOPY DIAGNOSTIC;  Surgeon: Dove, Myra C, MD;  Location: [facility];  Service: Gynecology;  Laterality: N/A;  . NO PAST SURGERIES      There were no vitals filed for this visit.  Subjective Assessment - 07/03/18 0802    Subjective  Pt states she had a good week.  No pain this morning, but states the back still hurts some and tingly/warm sometimes.    Patient Stated Goals  get rid of pain    Currently in Pain?  No/denies                       [facility] Adult PT Treatment/Exercise - 07/03/18 0001      Exercises   Exercises  Lumbar      Lumbar Exercises: Standing   Other Standing Lumbar Exercises  red tband - horizontal abd and diagonal - 20x each with cues keep core engaged without SB or rotation      Lumbar Exercises: Seated   Other Seated Lumbar Exercises  seated on ball circles both ways    Other Seated Lumbar Exercises  seated thoracic flexion and rotation both ways, PT OP for contract/relax and further stretching - 3 x 10 sec with OP; 3x10 sec  without OP      Lumbar Exercises: Supine   Other Supine Lumbar Exercises  thoracic ext supine on half foam roll - thoracic extension 5 x 10 sec      Manual Therapy   Soft tissue mobilization  T4-L2 paraspinals    Myofascial Release  thoracic and lumbar T4-L2               PT Short Term Goals - 07/03/18 0806      PT SHORT TERM GOAL #2   Title  Pt will report 20% less pain throughout the day    Baseline  50% improved    Status  Achieved        PT Long Term Goals - 07/03/18 0806      PT LONG TERM GOAL #1   Title  Pt will be ind with advanced HEP    Status  On-going      PT LONG TERM GOAL #2   Title  Pt will demonstrate hip adduction at least 4+/5 MMT bialterally for improved functional activities such as squatting            Plan - 07/03/18 0842    Clinical Impression   Statement  Pt continues to progress with some pain goals met.  She is able to do more things without pain.  Pt has rotation to the right with muscle spasms thoughout thoracic and lumbar parapinals.  She responded well to treatment and given thoracic rotation to do at home.  Pt will benefit from skilled PT to porgress core strength and reduce muscle spasms.    PT Treatment/Interventions  ADLs/Self Care Home Management;Biofeedback;Cryotherapy;Electrical Stimulation;Moist Heat;Therapeutic activities;Therapeutic exercise;Neuromuscular re-education;Patient/family education;Manual techniques;Passive range of motion;Dry needling;Taping    PT Next Visit Plan  start with thoracic fascial release and STM, thoracic flexion with rotation, progress core and pelvic strengthening with stretching, STM as needed, biofeedback re-assess    PT Home Exercise Plan   Access Code: [email or web address]     Consulted and Agree with Plan of Care  Patient       Patient will benefit from skilled therapeutic intervention in order to improve the following deficits and impairments:  Pain, Increased fascial restricitons, Postural dysfunction,  Decreased strength, Decreased endurance, Decreased range of motion, Impaired flexibility, Decreased scar mobility, Decreased coordination  Visit Diagnosis: Cramp and spasm  Muscle weakness (generalized)     Problem List Patient Active Problem List   Diagnosis Date Noted  . Pterygium of left eye 03/01/2018  . Chronic left shoulder pain 04/19/2017  . Chronic back pain greater than 3 months duration 04/19/2017  . Dyslipidemia 04/19/2017  . Hypothyroidism 04/07/2017  . HLD (hyperlipidemia) 02/06/2015  . Dyspareunia 05/20/2014  . Unspecified constipation 08/06/2013  . Onychomycosis 08/06/2013  . Hemorrhoid 08/06/2013  . GERD (gastroesophageal reflux disease) 08/06/2013  . Muscle ache 08/06/2013  . LOW BACK PAIN, MILD 03/02/2007  . HYPERGLYCEMIA 11/03/2006    Zannie Cove, PT 07/03/2018, 8:44 AM  [facility] [address], [place], [place], [address] Phone: [phone]   Fax:  [phone]  Name: Courtney Edwards MRN: [id number] Date of Birth: Nov 15, 1975

## 2018-07-07 ENCOUNTER — Encounter: Payer: Self-pay | Admitting: Physical Therapy

## 2018-07-07 ENCOUNTER — Ambulatory Visit: Payer: Self-pay | Admitting: Physical Therapy

## 2018-07-07 DIAGNOSIS — M6281 Muscle weakness (generalized): Secondary | ICD-10-CM

## 2018-07-07 DIAGNOSIS — R252 Cramp and spasm: Secondary | ICD-10-CM

## 2018-07-07 NOTE — Therapy (Signed)
[facility] [address], [address] [place], [place], [address] Phone: [phone]   Fax:  [phone]  Physical Therapy Treatment  Patient Details  Name: Courtney Edwards MRN: [id number] Date of Birth: 12/31/1975 Referring Provider (PT): Claiborne RiggFleming, Zelda W   Encounter Date: 07/07/2018  PT End of Session - 07/07/18 0806    Visit Number  15    Date for PT Re-Evaluation  07/17/18    PT Start Time  0800    PT Stop Time  0840    PT Time Calculation (min)  40 min    Activity Tolerance  Patient tolerated treatment well    Behavior During Therapy  [facility] for tasks assessed/performed       Past Medical History:  Diagnosis Date  . Hypothyroidism   . Low blood pressure   . Medical history non-contributory   . Mental disorder   . Post partum depression 2002    Past Surgical History:  Procedure Laterality Date  . LAPAROSCOPY N/A 02/09/2017   Procedure: LAPAROSCOPY DIAGNOSTIC;  Surgeon: Allie Bossierove, Myra C, MD;  Location: [facility];  Service: Gynecology;  Laterality: N/A;  . NO PAST SURGERIES      There were no vitals filed for this visit.  Subjective Assessment - 07/07/18 0805    Subjective  Pt states no pain this morning.  Yesterday I had pain in the abdomen and the back.    Pertinent History  5 vaginal deliveries    Patient Stated Goals  get rid of pain    Currently in Pain?  No/denies                       [facility]/Exercise - 07/07/18 0001      Exercises   Exercises  Lumbar      Lumbar Exercises: Stretches   Other Lumbar Stretch Exercise  thoracic flexion and rotation - 3 x 20 sec bilat      Lumbar Exercises: Aerobic   UBE (Upper Arm Bike)  L1 x 3/3 fwd/back   sitting on green ball; focus on posture and TrA activation     Lumbar Exercises: Supine   Other Supine Lumbar Exercises  lying supine foam roll - pec stretch, bent knee drops, clams red band; holding red ball flexion with UE;  red band UE diagonals and horizontal abduction 20x; small march    Other Supine Lumbar Exercises  thoracic extension on green ball      Lumbar Exercises: Quadruped   Single Arm Raises Limitations  20x on green pball    Straight Leg Raises Limitations  20x on green pball      Manual Therapy   Soft tissue mobilization  T4-L2 paraspinals    Myofascial Release  thoracic and lumbar T4-L2;                PT Short Term Goals - 07/03/18 0806      PT SHORT TERM GOAL #2   Title  Pt will report 20% less pain throughout the day    Baseline  50% improved    Status  Achieved        PT Long Term Goals - 07/03/18 0806      PT LONG TERM GOAL #1   Title  Pt will be ind with advanced HEP    Status  On-going      PT LONG TERM GOAL #2   Title  Pt will demonstrate hip adduction at  least 4+/5 MMT bialterally for improved functional activities such as squatting            Plan - 07/07/18 0927    Clinical Impression Statement  Pt had some abdominal pain with exercise prone on ball and reported feeling a little harder to breathe.  pt did well with all exercises and no pain at the end of treatment.  She is able to stabilize pelvis on foam roller with cues to activate TrA.  Pt had tight and tender glutes and lumbar paraspinals bilaterally Rt>Lt on lumbar.  She responded well to [facility] to loosen soft tissue.  pt will benefit from skilled PT to ensure successful transition to HEP and return to maximum function.    PT Treatment/Interventions  ADLs/Self Care Home Management;Biofeedback;Cryotherapy;Electrical Stimulation;Moist Heat;Therapeutic activities;Therapeutic exercise;Neuromuscular re-education;Patient/family education;Manual techniques;Passive range of motion;Dry needling;Taping    PT Next Visit Plan  progress core and pelvic strengthening with stretching, STM as needed, biofeedback re-assess, thoracic and ribcage mobility    PT Home Exercise Plan   Access Code: [email or web address]     Consulted and  Agree with Plan of Care  Patient       Patient will benefit from skilled therapeutic intervention in order to improve the following deficits and impairments:  Pain, Increased fascial restricitons, Postural dysfunction, Decreased strength, Decreased endurance, Decreased range of motion, Impaired flexibility, Decreased scar mobility, Decreased coordination  Visit Diagnosis: Cramp and spasm  Muscle weakness (generalized)     Problem List Patient Active Problem List   Diagnosis Date Noted  . Pterygium of left eye 03/01/2018  . Chronic left shoulder pain 04/19/2017  . Chronic back pain greater than 3 months duration 04/19/2017  . Dyslipidemia 04/19/2017  . Hypothyroidism 04/07/2017  . HLD (hyperlipidemia) 02/06/2015  . Dyspareunia 05/20/2014  . Unspecified constipation 08/06/2013  . Onychomycosis 08/06/2013  . Hemorrhoid 08/06/2013  . GERD (gastroesophageal reflux disease) 08/06/2013  . Muscle ache 08/06/2013  . LOW BACK PAIN, MILD 03/02/2007  . HYPERGLYCEMIA 11/03/2006    Vincente Poli, PT 07/07/2018, 10:58 AM  [facility] [address] [place], [place], [address] Phone: [phone]   Fax:  [phone]  Name: Courtney Edwards MRN: [id number] Date of Birth: 23-Sep-1975

## 2018-07-10 ENCOUNTER — Ambulatory Visit: Payer: Self-pay | Admitting: Physical Therapy

## 2018-07-10 ENCOUNTER — Encounter: Payer: Self-pay | Admitting: Physical Therapy

## 2018-07-10 DIAGNOSIS — M6281 Muscle weakness (generalized): Secondary | ICD-10-CM

## 2018-07-10 DIAGNOSIS — R252 Cramp and spasm: Secondary | ICD-10-CM

## 2018-07-10 NOTE — Therapy (Signed)
[facility] [address] [place], [place], [address] Phone: [phone]   Fax:  [phone]  Physical Therapy Treatment  Patient Details  Name: Courtney Edwards MRN: [id number] Date of Birth: [birth year] Referring Provider (PT): Gildardo Pounds   Encounter Date: 07/10/2018  PT End of Session - 07/10/18 0756    Visit Number  16    Date for PT Re-Evaluation  07/17/18    PT Start Time  0756    PT Stop Time  0833    PT Time Calculation (min)  37 min    Activity Tolerance  Patient tolerated treatment well    Behavior During Therapy  [facility] for tasks assessed/performed       Past Medical History:  Diagnosis Date  . Hypothyroidism   . Low blood pressure   . Medical history non-contributory   . Mental disorder   . Post partum depression 2002    Past Surgical History:  Procedure Laterality Date  . LAPAROSCOPY N/A 02/09/2017   Procedure: LAPAROSCOPY DIAGNOSTIC;  Surgeon: Emily Filbert, MD;  Location: [facility];  Service: Gynecology;  Laterality: N/A;  . NO PAST SURGERIES      There were no vitals filed for this visit.  Subjective Assessment - 07/10/18 0800    Subjective  I feel good today.  I have had lower back for a few days, but went away with pain medication.    Patient is accompained by:  Interpreter    Pertinent History  5 vaginal deliveries    Patient Stated Goals  get rid of pain    Currently in Pain?  No/denies         [facility] PT Assessment - 07/10/18 0001      Strength   Right Hip ABduction  4+/5    Right Hip ADduction  4+/5    Left Hip ABduction  4+/5    Left Hip ADduction  4+/5                   [facility] Adult PT Treatment/Exercise - 07/10/18 0001      Exercises   Exercises  Lumbar      Lumbar Exercises: Stretches   Press Ups  5 reps;10 seconds    Piriformis Stretch  5 reps;10 seconds;Right;Left    Other Lumbar Stretch Exercise  thoracic flexion and rotation - 3 x 20  sec bilat    Other Lumbar Stretch Exercise  hamstring stretch      Lumbar Exercises: Aerobic   UBE (Upper Arm Bike)  L1 x 3/3 fwd/back   sitting on green ball; focus on posture and TrA activation     Lumbar Exercises: Sidelying   Hip Abduction  Right;Left;10 reps    Other Sidelying Lumbar Exercises  Rt/Lt sidelying hip adduction - 10x      Lumbar Exercises: Quadruped   Single Arm Raise  Right;Left;5 reps;Limitations   with threading for thoracic rotation              PT Short Term Goals - 07/03/18 0806      PT SHORT TERM GOAL #2   Title  Pt will report 20% less pain throughout the day    Baseline  50% improved    Status  Achieved        PT Long Term Goals - 07/10/18 0830      PT LONG TERM GOAL #1   Title  Pt will be ind with advanced HEP  Status  Achieved      PT LONG TERM GOAL #2   Title  Pt will demonstrate hip adduction at least 4+/5 MMT bialterally for improved functional activities such as squatting    Baseline  4+/5 bilaterally    Status  Achieved      PT LONG TERM GOAL #3   Title  Pt will report 1/3 Marinoff scale     Baseline  just a little pain during, doesn't limit    Status  Achieved      PT LONG TERM GOAL #4   Title  Pt will report at least 70% less pressure in the vagina during typical daily activities.    Baseline  reports she is 80% improved overall    Status  Achieved            Plan - 07/10/18 0853    Clinical Impression Statement  Pt has met her goals and is feeling 50% less pain and able to do more at home.  Pt is able to have intercourse.  She reports she is gradually feeling better and ind with HEP at this time.  Pt will be discharged with HEP today.    PT Treatment/Interventions  ADLs/Self Care Home Management;Biofeedback;Cryotherapy;Electrical Stimulation;Moist Heat;Therapeutic activities;Therapeutic exercise;Neuromuscular re-education;Patient/family education;Manual techniques;Passive range of motion;Dry needling;Taping    PT  Next Visit Plan  discharged today    PT Home Exercise Plan   Access Code: [email or web address]     Consulted and Agree with Plan of Care  Patient       Patient will benefit from skilled therapeutic intervention in order to improve the following deficits and impairments:  Pain, Increased fascial restricitons, Postural dysfunction, Decreased strength, Decreased endurance, Decreased range of motion, Impaired flexibility, Decreased scar mobility, Decreased coordination  Visit Diagnosis: Cramp and spasm  Muscle weakness (generalized)     Problem List Patient Active Problem List   Diagnosis Date Noted  . Pterygium of left eye 03/01/2018  . Chronic left shoulder pain 04/19/2017  . Chronic back pain greater than 3 months duration 04/19/2017  . Dyslipidemia 04/19/2017  . Hypothyroidism 04/07/2017  . HLD (hyperlipidemia) 02/06/2015  . Dyspareunia 05/20/2014  . Unspecified constipation 08/06/2013  . Onychomycosis 08/06/2013  . Hemorrhoid 08/06/2013  . GERD (gastroesophageal reflux disease) 08/06/2013  . Muscle ache 08/06/2013  . LOW BACK PAIN, MILD 03/02/2007  . HYPERGLYCEMIA 11/03/2006    Zannie Cove, PT 07/10/2018, 8:56 AM  [facility] [address] [place], [place], [address] Phone: [phone]   Fax:  [phone]  Name: Courtney Edwards MRN: [id number] Date of Birth: [birth year]  PHYSICAL THERAPY DISCHARGE SUMMARY  Visits from Start of Care: 16  Current functional level related to goals / functional outcomes: See above goals achieved   Remaining deficits: See above   Education / Equipment: HEP Plan: Patient agrees to discharge.  Patient goals were met. Patient is being discharged due to meeting the stated rehab goals.  ?????     [employer], PT 07/10/18 8:57 AM

## 2018-07-24 MED FILL — ?ATORVASTATIN 40MG TABLET: 40 | 30 days supply | Qty: 30 | Fill #4

## 2018-07-24 MED FILL — ?LEVOTHYROXINE 25 MCG TABLE: 25 | 30 days supply | Qty: 30 | Fill #3

## 2018-07-31 ENCOUNTER — Ambulatory Visit: Payer: Self-pay | Attending: Nurse Practitioner

## 2018-08-01 ENCOUNTER — Ambulatory Visit: Payer: Self-pay

## 2018-08-14 IMAGING — CR DG ABDOMEN 1V
1 series · 1 of 1 positions shown · non-contrast
Comparison: Abdominal and pelvic CT scan February 24, 2011

CLINICAL DATA: Generalized abdominal pain for the past 3 years with
increasing symptoms over the past 18 months.

EXAM:
ABDOMEN - 1 VIEW

[abdomen kub]
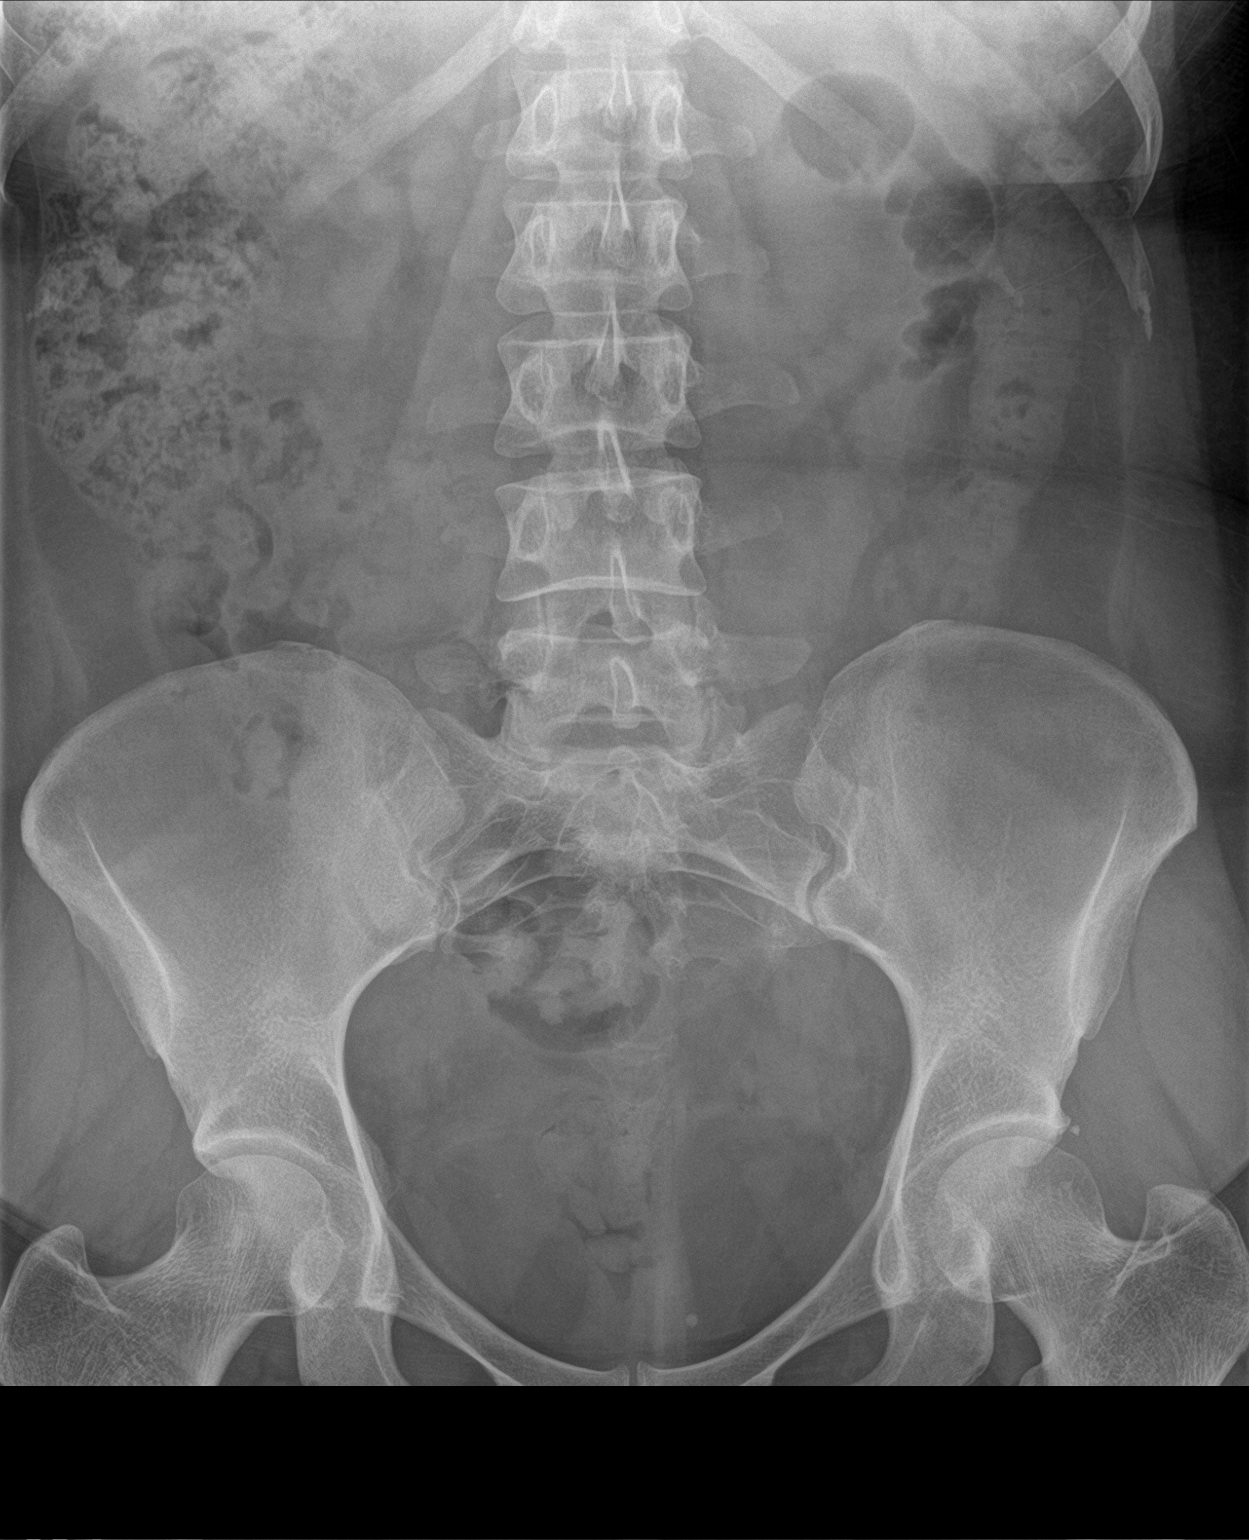

[1 of 1 positions shown; findings below may reference images not displayed]

FINDINGS: The bowel gas pattern is normal. There are no abnormal soft tissue
calcifications. There are phleboliths within the pelvis. The bony
structures exhibit no acute abnormalities.
IMPRESSION: No acute intra-abdominal abnormality is observed. No calcified
urinary tract stones are demonstrated.

## 2018-08-25 ENCOUNTER — Ambulatory Visit: Payer: Self-pay | Admitting: Nurse Practitioner

## 2018-08-28 ENCOUNTER — Telehealth: Payer: Self-pay | Admitting: Nurse Practitioner

## 2018-08-28 NOTE — Telephone Encounter (Signed)
Pt was informed that the CAFA letter will be at the FO waiting for her

## 2018-08-28 NOTE — Telephone Encounter (Signed)
Patient wanted to get a copy of her CAFA letter. She would like to pick it up when she comes in on Thursday to her appt. Please follow up.

## 2018-08-31 ENCOUNTER — Ambulatory Visit: Payer: Self-pay | Attending: Nurse Practitioner | Admitting: Physician Assistant

## 2018-08-31 VITALS — BP 90/61 | HR 61 | Temp 98.8°F | Ht 65.0 in | Wt 181.0 lb

## 2018-08-31 DIAGNOSIS — I959 Hypotension, unspecified: Secondary | ICD-10-CM

## 2018-08-31 DIAGNOSIS — Z789 Other specified health status: Secondary | ICD-10-CM

## 2018-08-31 DIAGNOSIS — R5383 Other fatigue: Secondary | ICD-10-CM

## 2018-08-31 DIAGNOSIS — R531 Weakness: Secondary | ICD-10-CM

## 2018-08-31 DIAGNOSIS — E039 Hypothyroidism, unspecified: Secondary | ICD-10-CM

## 2018-08-31 DIAGNOSIS — R52 Pain, unspecified: Secondary | ICD-10-CM

## 2018-08-31 NOTE — Patient Instructions (Signed)
Drink 80-100 ounces of water daily.  Check blood pressure 3 times/week and record and bring to your next visit.

## 2018-08-31 NOTE — Progress Notes (Signed)
Patient ID: Courtney Edwards, female   DOB: 06/04/1976, [age] y.o.   MRN: [id number]     Courtney Edwards, is a 43 y.o. female  [id number]  [id number]  DOB - 09/02/1975  Subjective:  Chief Complaint and HPI: Courtney Edwards is a 43 y.o. female here today with complaints of "BP over the weekend of 5258."  She says her daughter took her BP with her watch.  At the time she felt weak.  She drank gatorade and put a wet cloth on her neck and felt better.  She only felt bad about 2 hours.  Felt kind of weak all weekend.  Mouth felt dry at the time of the episode. No CP/SOB/HA/Dizziness/changes in vision.  She also had intermittent nausea.  Feels fine today.  Also c/o fluid coming out of L ear.  No pain  [employer] interpreters "Alex" translating  ROS:   Constitutional:  No f/c, No night sweats, No unexplained weight loss. EENT:  No vision changes, No blurry vision, No hearing changes. No other mouth, throat, or ear problems.  Respiratory: No cough, No SOB Cardiac: No CP, no palpitations GI:  No abd pain, No N/V/D. GU: No Urinary s/sx Musculoskeletal: No joint pain Neuro: No headache, no dizziness, no motor weakness.  Skin: No rash Endocrine:  No polydipsia. No polyuria.  Psych: Denies SI/HI  No problems updated.  ALLERGIES: No Known Allergies  PAST MEDICAL HISTORY: Past Medical History:  Diagnosis Date  . Hypothyroidism   . Low blood pressure   . Medical history non-contributory   . Mental disorder   . Post partum depression 2002    MEDICATIONS AT HOME: Prior to Admission medications   Medication Sig Start Date End Date Taking? Authorizing Provider  atorvastatin (LIPITOR) 40 MG tablet Take 1 tablet (40 mg total) by mouth daily at 6 PM. 01/24/18  Yes Claiborne RiggFleming, Zelda W, NP  levothyroxine (SYNTHROID) 25 MCG tablet Take 1 tablet (25 mcg total) by mouth daily before breakfast. 03/01/18  Yes McClung, Angela M, PA-C  lidocaine (XYLOCAINE) 5 % ointment Apply 1  application topically as needed. Patient not taking: Reported on 08/31/2018 01/20/18   Claiborne RiggFleming, Zelda W, NP  omega-3 acid ethyl esters (LOVAZA) 1 g capsule Take 2 capsules (2 g total) by mouth 2 (two) times daily. 01/24/18 02/23/18  Claiborne RiggFleming, Zelda W, NP     Objective:  EXAM:   Vitals:   08/31/18 1058  BP: 90/61  Pulse: 61  Temp: 98.8 F (37.1 C)  TempSrc: Oral  SpO2: 98%  Weight: 181 lb (82.1 kg)  Height: 5\' 5"  (1.651 m)    General appearance : A&OX3. NAD. Non-toxic-appearing HEENT: Atraumatic and Normocephalic.  PERRLA. EOM intact.  TM clear B. Canal WNL Mouth-MMM, post pharynx WNL w/o erythema, No PND. Neck: supple, no JVD. No cervical lymphadenopathy. No thyromegaly Chest/Lungs:  Breathing-non-labored, Good air entry bilaterally, breath sounds normal without rales, rhonchi, or wheezing  CVS: S1 S2 regular, no murmurs, gallops, rubs  Extremities: Bilateral Lower Ext shows no edema, both legs are warm to touch with = pulse throughout Neurology:  CN II-XII grossly intact, Non focal.   Psych:  TP linear. J/I WNL. Normal speech. Appropriate eye contact and affect.  Skin:  No Rash  Data Review Lab Results  Component Value Date   HGBA1C 5.50 01/27/2015     Assessment & Plan   1. Fatigue, unspecified type No red flags on exam - Thyroid Panel With TSH - CBC with Differential/Platelet  2.  Hypothyroidism, unspecified type - Thyroid Panel With TSH - Comprehensive metabolic panel  3. Weakness resolved - Comprehensive metabolic panel  4. Body aches - Vitamin D, 25-hydroxy  5. Hypotension, unspecified hypotension type In reviewing Epic-Bp always <=100/70.  Hydration imperative-drink 80-100 ounces water daily  6. Language barrier [employer] interpreters used and additional time performing visit was required.  Patient have been counseled extensively about nutrition and exercise  F/up 3 weeks for BP  The patient was given clear instructions to go to ER or return to medical  center if symptoms don't improve, worsen or new problems develop. The patient verbalized understanding. The patient was told to call to get lab results if they haven't heard anything in the next week.     Georgian Co, PA-C [facility] [place], [place] [phone]   08/31/2018, 11:18 AM

## 2018-09-01 ENCOUNTER — Other Ambulatory Visit: Payer: Self-pay | Admitting: Physician Assistant

## 2018-09-01 LAB — CBC WITH DIFFERENTIAL/PLATELET
BASOS: 0 %
Basophils Absolute: 0 10*3/uL (ref 0.0–0.2)
EOS (ABSOLUTE): 0.1 10*3/uL (ref 0.0–0.4)
EOS: 1 %
HEMATOCRIT: 38.6 % (ref 34.0–46.6)
HEMOGLOBIN: 12.9 g/dL (ref 11.1–15.9)
Immature Grans (Abs): 0 10*3/uL (ref 0.0–0.1)
Immature Granulocytes: 0 %
LYMPHS ABS: 2.9 10*3/uL (ref 0.7–3.1)
LYMPHS: 37 %
MCH: 29.4 pg (ref 26.6–33.0)
MCHC: 33.4 g/dL (ref 31.5–35.7)
MCV: 88 fL (ref 79–97)
MONOCYTES: 6 %
Monocytes Absolute: 0.5 10*3/uL (ref 0.1–0.9)
NEUTROS ABS: 4.3 10*3/uL (ref 1.4–7.0)
Neutrophils: 56 %
Platelets: 390 10*3/uL (ref 150–450)
RBC: 4.39 x10E6/uL (ref 3.77–5.28)
RDW: 13.4 % (ref 11.7–15.4)
WBC: 7.9 10*3/uL (ref 3.4–10.8)

## 2018-09-01 LAB — COMPREHENSIVE METABOLIC PANEL
ALBUMIN: 4.1 g/dL (ref 3.5–5.5)
ALT: 76 IU/L — AB (ref 0–32)
AST: 36 IU/L (ref 0–40)
Albumin/Globulin Ratio: 1.4 (ref 1.2–2.2)
Alkaline Phosphatase: 80 IU/L (ref 39–117)
BILIRUBIN TOTAL: 0.4 mg/dL (ref 0.0–1.2)
BUN / CREAT RATIO: 13 (ref 9–23)
BUN: 10 mg/dL (ref 6–24)
CALCIUM: 9.1 mg/dL (ref 8.7–10.2)
CHLORIDE: 103 mmol/L (ref 96–106)
CO2: 21 mmol/L (ref 20–29)
Creatinine, Ser: 0.77 mg/dL (ref 0.57–1.00)
GFR, EST AFRICAN AMERICAN: 112 mL/min/{1.73_m2} (ref >59)
GFR, EST NON AFRICAN AMERICAN: 97 mL/min/{1.73_m2} (ref >59)
Globulin, Total: 2.9 g/dL (ref 1.5–4.5)
Glucose: 91 mg/dL (ref 65–99)
Potassium: 4.5 mmol/L (ref 3.5–5.2)
Sodium: 138 mmol/L (ref 134–144)
TOTAL PROTEIN: 7 g/dL (ref 6.0–8.5)

## 2018-09-01 LAB — VITAMIN D 25 HYDROXY (VIT D DEFICIENCY, FRACTURES): VIT D 25 HYDROXY: 17.7 ng/mL — AB (ref 30.0–100.0)

## 2018-09-01 LAB — THYROID PANEL WITH TSH
FREE THYROXINE INDEX: 1.4 (ref 1.2–4.9)
T3 Uptake Ratio: 21 % — ABNORMAL LOW (ref 24–39)
T4, Total: 6.8 ug/dL (ref 4.5–12.0)
TSH: 4.28 u[IU]/mL (ref 0.450–4.500)

## 2018-09-01 MED ORDER — VITAMIN D (ERGOCALCIFEROL) 1.25 MG (50000 UNIT) PO CAPS: 50000.0000 [IU] | ORAL_CAPSULE | ORAL | 0 refills | Status: DC

## 2018-09-01 MED FILL — VIT D2 1.25 MG (50,000 UNIT: 1.25 MG | 84 days supply | Qty: 12 | Fill #0

## 2018-09-07 ENCOUNTER — Telehealth: Payer: Self-pay | Admitting: Nurse Practitioner

## 2018-09-07 NOTE — Telephone Encounter (Signed)
Patient will be called by CMA with lab results.

## 2018-09-07 NOTE — Telephone Encounter (Signed)
Pt came in requesting her lab results

## 2018-09-08 ENCOUNTER — Telehealth: Payer: Self-pay

## 2018-09-08 NOTE — Telephone Encounter (Signed)
[employer] interpre[employer] [place]  Id# [phone]  contacted pt to go over lab results pt didn't answer left a detailed vm informing pt of results and if she has any questions or concerns to give me a call

## 2018-10-16 ENCOUNTER — Ambulatory Visit: Payer: Self-pay | Admitting: Nurse Practitioner

## 2018-11-25 NOTE — Telephone Encounter (Signed)
error 

## 2018-12-11 MED FILL — VIT D2 1.25 MG (50,000 UNIT: 1.25 MG | 28 days supply | Qty: 4 | Fill #1

## 2018-12-11 MED FILL — ?ATORVASTATIN 40MG TABLET: 40 | 60 days supply | Qty: 60 | Fill #5

## 2018-12-11 MED FILL — LEVOTHYROXINE 25 MCG TABLET: 25 | 30 days supply | Qty: 30 | Fill #4

## 2019-02-22 ENCOUNTER — Ambulatory Visit: Payer: Self-pay | Attending: Family Medicine

## 2019-02-22 ENCOUNTER — Other Ambulatory Visit: Payer: Self-pay

## 2019-03-05 ENCOUNTER — Ambulatory Visit: Payer: Self-pay | Attending: Nurse Practitioner | Admitting: Nurse Practitioner

## 2019-03-05 ENCOUNTER — Encounter: Payer: Self-pay | Admitting: Nurse Practitioner

## 2019-03-05 ENCOUNTER — Other Ambulatory Visit: Payer: Self-pay

## 2019-03-05 VITALS — BP 105/71 | HR 61 | Temp 99.3°F | Ht 65.0 in | Wt 187.0 lb

## 2019-03-05 DIAGNOSIS — E559 Vitamin D deficiency, unspecified: Secondary | ICD-10-CM

## 2019-03-05 DIAGNOSIS — E039 Hypothyroidism, unspecified: Secondary | ICD-10-CM

## 2019-03-05 DIAGNOSIS — R7401 Elevation of levels of liver transaminase levels: Secondary | ICD-10-CM

## 2019-03-05 DIAGNOSIS — R1013 Epigastric pain: Secondary | ICD-10-CM

## 2019-03-05 DIAGNOSIS — E785 Hyperlipidemia, unspecified: Secondary | ICD-10-CM

## 2019-03-05 MED ORDER — OMEGA-3-ACID ETHYL ESTERS 1 G PO CAPS: 2.0000 g | ORAL_CAPSULE | Freq: Two times a day (BID) | ORAL | 3 refills | Status: DC

## 2019-03-05 MED ORDER — ATORVASTATIN CALCIUM 40 MG PO TABS: 40.0000 mg | ORAL_TABLET | Freq: Every day | ORAL | 6 refills | Status: DC

## 2019-03-05 MED ORDER — LEVOTHYROXINE SODIUM 25 MCG PO TABS: 25.0000 ug | ORAL_TABLET | Freq: Every day | ORAL | 5 refills | Status: DC

## 2019-03-05 MED ORDER — OMEPRAZOLE 20 MG PO CPDR: 20.0000 mg | DELAYED_RELEASE_CAPSULE | Freq: Every day | ORAL | 2 refills | Status: DC

## 2019-03-05 MED FILL — ?LEVOTHYROXINE 25 MCG TABLE: 25 | 30 days supply | Qty: 30 | Fill #0

## 2019-03-05 MED FILL — ?ATORVASTATIN 40MG TABLET: 40 | 30 days supply | Qty: 30 | Fill #0

## 2019-03-05 MED FILL — ?OMEPRAZOLE 20 MG CAPSULE D: 20 | 30 days supply | Qty: 30 | Fill #0

## 2019-03-05 NOTE — Progress Notes (Signed)
Assessment & Plan:  Courtney Edwards was seen today for follow-up.  Diagnoses and all orders for this visit:  Epigastric pain -     omeprazole (PRILOSEC) 20 MG capsule; Take 1 capsule (20 mg total) by mouth daily. -     CBC -     CMP14+EGFR INSTRUCTIONS: Avoid GERD Triggers: acidic, spicy or fried foods, caffeine, coffee, sodas,  alcohol and chocolate.   Hypothyroidism, unspecified type -     TSH -     levothyroxine (SYNTHROID) 25 MCG tablet; Take 1 tablet (25 mcg total) by mouth daily before breakfast. Lab Results  Component Value Date   TSH 4.160 03/05/2019   Dyslipidemia -     omega-3 acid ethyl esters (LOVAZA) 1 g capsule; Take 2 capsules (2 g total) by mouth 2 (two) times daily. -     atorvastatin (LIPITOR) 40 MG tablet; Take 1 tablet (40 mg total) by mouth daily at 6 PM. -     Lipid panel Lab Results  Component Value Date   LDLCALC 138 (H) 03/05/2019  Chronic. Denies any statin intolerance or myalgias.   Vitamin D deficiency disease -     VITAMIN D 25 Hydroxy (Vit-D Deficiency, Fractures)    Patient has been counseled on age-appropriate routine health concerns for screening and prevention. These are reviewed and up-to-date. Referrals have been placed accordingly. Immunizations are up-to-date or declined.    Subjective:   Chief Complaint  Patient presents with  . Follow-up    Pt. stated she woudl like her stomach to be examine because she is having pain. Pt. stated she think her Thyroid medication is giving her stomach pain.    HPI Courtney Edwards 43 y.o. female presents to office today with complaints of Abdominal pain. She feels it is related to her thyroid medication however she has been on levothyroxine for over 2 years.  VRI was used to communicate directly with patient for the entire encounter including providing detailed patient instructions.   Abdominal Pain: Patient complains of abdominal pain. The pain is described as aching, burning and  pressure-like, and is 4/10 in intensity. Pain is located in the epigastric without radiation. Onset was several months ago. Symptoms have been gradually worsening. Aggravating factors: fatty foods and spicy foods.  Alleviating factors: drinking milk.  The patient denies diarrhea, fever, hematochezia, myalgias, nausea and vomiting. She does endorse a history of intermittent constipation. She endorses abdominal pain more noticeable after eating. Also worse when she does not eat.Pain can lasts for hours. Drinking milk helps the pain go away.   Review of Systems  Constitutional: Negative for fever, malaise/fatigue and weight loss.  HENT: Negative.  Negative for nosebleeds.   Eyes: Negative.  Negative for blurred vision, double vision and photophobia.  Respiratory: Negative.  Negative for cough and shortness of breath.   Cardiovascular: Negative.  Negative for chest pain, palpitations and leg swelling.  Gastrointestinal: Positive for abdominal pain and constipation. Negative for heartburn, nausea and vomiting.  Musculoskeletal: Negative.  Negative for myalgias.  Neurological: Negative.  Negative for dizziness, focal weakness, seizures and headaches.  Psychiatric/Behavioral: Positive for depression. Negative for suicidal ideas.    Past Medical History:  Diagnosis Date  . Hypothyroidism   . Low blood pressure   . Medical history non-contributory   . Mental disorder   . Post partum depression 2002    Past Surgical History:  Procedure Laterality Date  . LAPAROSCOPY N/A 02/09/2017   Procedure: LAPAROSCOPY DIAGNOSTIC;  Surgeon: Emily Filbert,  MD;  Location: [facility];  Service: Gynecology;  Laterality: N/A;  . NO PAST SURGERIES      Family History  Problem Relation Age of Onset  . Hypertension Mother   . Diabetes Mother   . Heart disease Mother     Social History Reviewed with no changes to be made today.   Outpatient Medications Prior to Visit  Medication Sig Dispense Refill  .  atorvastatin (LIPITOR) 40 MG tablet Take 1 tablet (40 mg total) by mouth daily at 6 PM. 30 tablet 6  . levothyroxine (SYNTHROID) 25 MCG tablet Take 1 tablet (25 mcg total) by mouth daily before breakfast. 30 tablet 5  . Vitamin D, Ergocalciferol, (DRISDOL) 1.25 MG (50000 UT) CAPS capsule Take 1 capsule (50,000 Units total) by mouth every 7 (seven) days. (Patient not taking: Reported on 03/05/2019) 16 capsule 0  . lidocaine (XYLOCAINE) 5 % ointment Apply 1 application topically as needed. (Patient not taking: Reported on 08/31/2018) 35.44 g 1  . omega-3 acid ethyl esters (LOVAZA) 1 g capsule Take 2 capsules (2 g total) by mouth 2 (two) times daily. 120 capsule 3   No facility-administered medications prior to visit.     No Known Allergies     Objective:    BP 105/71 (BP Location: Right Arm, Patient Position: Sitting, Cuff Size: Normal)   Pulse 61   Temp 99.3 F (37.4 C) (Oral)   Ht '5\' 5"'$  (1.651 m)   Wt 187 lb (84.8 kg)   LMP 02/06/2019 (LMP Unknown)   SpO2 98%   BMI 31.12 kg/m  Wt Readings from Last 3 Encounters:  03/05/19 187 lb (84.8 kg)  08/31/18 181 lb (82.1 kg)  03/01/18 182 lb (82.6 kg)    Physical Exam Vitals signs and nursing note reviewed.  Constitutional:      Appearance: She is well-developed.  HENT:     Head: Normocephalic and atraumatic.  Neck:     Musculoskeletal: Normal range of motion.  Cardiovascular:     Rate and Rhythm: Normal rate and regular rhythm.     Heart sounds: Normal heart sounds. No murmur. No friction rub. No gallop.   Pulmonary:     Effort: Pulmonary effort is normal. No tachypnea or respiratory distress.     Breath sounds: Normal breath sounds. No decreased breath sounds, wheezing, rhonchi or rales.  Chest:     Chest wall: No tenderness.  Abdominal:     General: Bowel sounds are normal.     Palpations: Abdomen is soft.     Tenderness: There is abdominal tenderness in the epigastric area.  Musculoskeletal: Normal range of motion.  Skin:     General: Skin is warm and dry.  Neurological:     Mental Status: She is alert and oriented to person, place, and time.     Coordination: Coordination normal.  Psychiatric:        Behavior: Behavior normal. Behavior is cooperative.        Thought Content: Thought content normal.        Judgment: Judgment normal.        Patient has been counseled extensively about nutrition and exercise as well as the importance of adherence with medications and regular follow-up. The patient was given clear instructions to go to ER or return to medical center if symptoms don't improve, worsen or new problems develop. The patient verbalized understanding.   Follow-up: No follow-ups on file.   Gildardo Pounds, FNP-BC [place]  [place], [place]   03/08/2019, 11:40 PM

## 2019-03-05 NOTE — Patient Instructions (Signed)
Opciones de alimentos para pacientes adultos con enfermedad de reflujo gastroesofgico Food Choices for Gastroesophageal Reflux Disease, Adult Si tiene enfermedad de reflujo gastroesofgico (ERGE), los alimentos que consume y los hbitos de alimentacin son muy importantes. Elegir los alimentos adecuados puede ayudar a Altria Groupaliviar las molestias. Piense en consultar a un especialista en nutricin (nutricionista) para que lo ayude a Associate Professorhacer buenas elecciones. Consejos para seguir Goodrich Corporationeste plan  Comidas  Elija alimentos saludables con bajo contenido de grasa, como frutas, verduras, cereales integrales, productos lcteos descremados y carne San Marinomagra de [place], de pescado y de Montana[place]ave.  Haga comidas pequeas durante Glass blower/designerel da en lugar de 3 comidas abundantes. Coma lentamente y en un lugar donde est distendido. Evite agacharse o recostarse hasta 2 o 3horas despus de haber comido.  Evite comer 2 a 3horas antes de ir a acostarse.  Evite beber grandes cantidades de lquidos con las comidas.  Evite frer los alimentos a la hora de la coccin. Puede hornear, grillar o asar a la parrilla.  Evite o limite la cantidad de: ? Chocolate. ? Menta y mentol. ? Alcohol. ? Pimienta. ? Caf negro y descafeinado. ? T negro y descafeinado. ? Bebidas con gas (gaseosas). ? Bebidas energizantes y refrescos que contengan cafena.  Limite los alimentos con alto contenido de Tariffvillegrasas, por ejemplo: ? Carnes grasas o alimentos fritos. ? Leche entera, crema, manteca o helado. ? Nueces y Blawnoxmantequillas de frutos secos. ? Pastelera, donas y dulces hechos con Chinamanteca o Indiamargarina.  Evite los alimentos que le ocasionen sntomas. Estos pueden ser distintos para Advertising account plannercada persona. Los alimentos que suelen causan sntomas son los siguientes: ? HaematologistTomates. ? Naranjas, limones y limas. ? Pimientos. ? Comidas condimentadas. ? Cebolla y Kae Hellerajo. ? Vinagre. Estilo de vida  Mantenga un peso saludable. Pregntele a su mdico cul es el peso saludable  para usted. Si necesita perder peso, hable con su mdico para hacerlo de manera segura.  Realice actividad fsica durante, al menos, 30 minutos 5 das por semana o ms, o segn lo indicado por su mdico.  Use ropa suelta.  No fume. Si necesita ayuda para dejar de fumar, consulte al mdico.  Duerma con la cabecera de la cama ms elevada que los pies. Use una cua debajo del colchn o bloques debajo del armazn de la cama para Pharmacologistmantener la cabecera de la cama elevada. Resumen  Si tiene enfermedad de reflujo gastroesofgico (ERGE), las elecciones de alimentos y el Ardmoreestilo de vida son muy importantes para ayudar a Paramedicaliviar los sntomas.  Haga comidas pequeas durante Glass blower/designerel da en lugar de 3 comidas abundantes. Coma lentamente y en un lugar donde est distendido.  Limite los alimentos con alto contenido graso como la carne grasa o los alimentos fritos.  Evite agacharse o recostarse hasta 2 o 3horas despus de haber comido.  Evite la menta y St. Bonaventurehierba buena, la cafena, el alcohol y el chocolate. Esta informacin no tiene Theme park managercomo fin reemplazar el consejo del mdico. Asegrese de hacerle al mdico cualquier pregunta que tenga. Document Released: 02/01/2012 Document Revised: 03/08/2017 Document Reviewed: 03/08/2017 Elsevier Patient Education  2020 Elsevier Inc.  Acidez estomacal Heartburn La acidez estomacal es un tipo de dolor o Dentistmalestar que puede sentirse en la garganta o en el pecho. Con frecuencia se describe como un dolor urente (ardor). Tambin puede causar mal sabor en la boca que se siente cido. La acidez estomacal puede empeorar al acostarse o al inclinarse. Puede ser peor por la noche. Puede ser ocasionada porque el contenido del estmago vuelve  hacia arriba (reflujo) por el tubo que conecta la boca con el estmago (esfago). Siga estas indicaciones en su casa: Comida y bebida   Evite determinados alimentos y bebidas como se lo haya indicado el mdico. Estos pueden incluir: ? Caf y t (con  o sin cafena). ? Bebidas que contengan alcohol. ? Bebidas energticas y deportivas. ? Bebidas gaseosas o refrescos. ? Chocolate y cacao. ? Menta y esencia de Runnelstownmenta. ? Ajo y cebolla. ? Rbano picante. ? Alimentos cidos y condimentados, tales como:  Pimientos.  Arubahile en polvo y curry en polvo.  Vinagre.  Salsas picantes y Occidental Petroleumsalsa barbacoa. ? Los ctricos y sus jugos, tales como:  HideawayNaranjas.  Limones.  Limas. ? Alimentos a base de tomate, tales como:  Salsa roja y pizza con salsa roja.  Arubahile.  Salsa. ? Alimentos fritos y Scientist, physiologicalgrasosos, tales como:  [place].  Papas fritas y papas fritas de bolsa.  Aderezos con alto contenido de Holiday representativegrasa. ? Carnes con alto contenido de Duttongrasa, tales como:  Perros calientes y Primary school teachersalchichas.  Chuletas.  Jamn y tocino. ? Productos lcteos con alto contenido de Bloomingdalegrasa, tales como:  Leche entera.  Mantequilla.  Queso crema.  Consuma pequeas cantidades de comida con ms frecuencia. Evite consumir porciones abundantes.  Evite beber grandes cantidades de lquidos con las comidas.  Evite comer 2 o 3horas antes de acostarse.  Evite recostarse inmediatamente despus de comer.  No haga ejercicios enseguida despus de comer. Estilo de vida      Si tiene sobrepeso, baje una cantidad de peso saludable para usted. Consulte a su mdico para bajar de peso de MetLifemanera segura.  No consuma ningn producto que contenga nicotina o tabaco, lo que incluye cigarrillos, cigarrillos electrnicos y tabaco de Theatre managermascar. Estos pueden empeorar los sntomas. Si necesita ayuda para dejar de fumar, consulte al mdico.  Use ropa holgada. No use nada apretado alrededor de la cintura.  Levante (eleve) la cabecera de la cama aproximadamente 6pulgadas (15cm) para dormir.  Intente reducir J. C. Penneyel nivel de estrs. Si necesita ayuda para hacer esto, consulte al mdico. Indicaciones generales  Est atento a cualquier cambio en los sntomas.  Tome los medicamentos de  venta libre y los recetados solamente como se lo haya indicado el mdico. ? No tome aspirina, ibuprofeno ni otros antiinflamatorios no esteroideos (AINE) a menos que el mdico lo autorice. ? Deje de tomar medicamentos solamente como se lo haya indicado el mdico.  Concurra a todas las visitas de seguimiento como se lo haya indicado el mdico. Esto es importante. Comunquese con un mdico si:  Aparecen nuevos sntomas.  Adelgaza y no sabe por qu est sucediendo esto.  Tiene problemas para tragar, o le duele cuando traga.  Tiene sibilancia o tos persistente.  Los sntomas no mejoran con Scientist, research (medical)el tratamiento.  Tiene acidez estomacal con frecuencia durante ms de 2semanas. Solicite ayuda inmediatamente si:  Goldman SachsSiente dolor en los brazos, el cuello, la Morgantonmandbula, los dientes o la espalda.  Se siente transpirado, mareado o tiene una sensacin de desvanecimiento.  Siente falta de aire o Journalist, newspaperdolor en el pecho.  Vomita, y el vmito tiene un aspecto similar a la sangre o a los posos de caf.  Las deposiciones (heces) son sanguinolentas o negras. Estos sntomas pueden representar un problema grave que constituye Radio broadcast assistantuna emergencia. No espere a ver si los sntomas desaparecen. Solicite atencin mdica de inmediato. Comunquese con el servicio de emergencias de su localidad (911 en los [place]). No conduzca por sus propios medios OfficeMax Incorporatedhasta el hospital.  Resumen  La acidez estomacal es un tipo de dolor que puede sentirse en la garganta o en el pecho. Puede sentirse como un dolor urente (ardor). Tambin puede causar mal sabor en la boca que se siente cido.  Es posible que deba evitar ciertos alimentos y bebidas para ayudar a Paramedicaliviar los sntomas. Pregntele al mdico qu alimentos y bebidas Personnel officerdebe evitar.  Tome los medicamentos de venta libre y los recetados solamente como se lo haya indicado el mdico. No tome aspirina, ibuprofeno ni otros antiinflamatorios no esteroideos (AINE) a menos que el mdico se lo  haya indicado.  Comunquese con el mdico si los sntomas no mejoran o si empeoran. Esta informacin no tiene Theme park managercomo fin reemplazar el consejo del mdico. Asegrese de hacerle al mdico cualquier pregunta que tenga. Document Released: 04/14/2011 Document Revised: 02/09/2018 Document Reviewed: 02/09/2018 Elsevier Patient Education  2020 Elsevier Inc.  Enfermedad de reflujo gastroesofgico en los adultos Gastroesophageal Reflux Disease, Adult El reflujo gastroesofgico (RGE) ocurre cuando el cido del estmago sube por el tubo que conecta la boca con el estmago (esfago). Normalmente, la comida baja por el esfago y se mantiene en el [address]ago, donde se la digiere. Cuando una persona tiene RGE, los alimentos y el cido estomacal suelen volver al esfago. Usted puede tener una enfermedad llamada enfermedad de reflujo gastroesofgico (ERGE) si el reflujo:  Sucede a menudo.  Causa sntomas frecuentes o muy intensos.  Causa problemas tales como dao en el esfago. Cuando esto ocurre, el esfago duele y se hincha (inflama). Con el tiempo, la ERGE puede ocasionar pequeos agujeros (lceras) en el revestimiento del esfago. Cules son las causas? Esta afeccin se debe a un problema en el msculo que se encuentra entre el esfago y Arroyo Secoel estmago. Cuando este msculo est dbil o no es normal, no se cierra correctamente para impedir que los alimentos y el cido regresen del Teaching laboratory technicianestmago. El msculo puede debilitarse debido a lo siguiente:  El consumo de Winntabaco.  ColumbiavilleEmbarazo.  Tener cierto tipo de hernia (hernia de hiato).  Consumo de alcohol.  Ciertos alimentos y bebidas, como caf, chocolate, cebollas y Buckshotmenta. Qu incrementa el riesgo? Es ms probable que tenga esta afeccin si:  Tiene sobrepeso.  Tiene una enfermedad que afecta el tejido conjuntivo.  Botswanasa antiinflamatorios no esteroideos (AINE). Cules son los signos o los sntomas? Los sntomas de esta afeccin incluyen:  Acidez estomacal.   Dificultad o dolor al tragar.  Sensacin de Warehouse managertener un bulto en la garganta.  Sabor amargo en la boca.  Mal aliento.  Tener una gran cantidad de saliva.  Estmago inflamado o con Dentistmalestar.  Eructos.  Dolor en el pecho. El dolor de pecho puede deberse a distintas afecciones. Asegrese de Science writerconsultar a su mdico si tiene Journalist, newspaperdolor en el pecho.  Falta de aire o respiracin ruidosa (sibilancias).  Tos constante (crnica) o durante la noche.  Desgaste de la superficie de los dientes (esmalte dental).  Prdida de peso. Cmo se trata? El tratamiento depender de la gravedad de los sntomas. El mdico puede sugerirle lo siguiente:  Cambios en la dieta.  Medicamentos.  Cipriano MileUna ciruga. Siga estas indicaciones en su casa: Comida y bebida   Siga una dieta como se lo haya indicado el mdico. Es posible que deba evitar alimentos y bebidas, por ejemplo: ? Caf y t (con o sin cafena). ? Bebidas que contengan alcohol. ? Bebidas energticas y deportivas. ? Bebidas gaseosas y refrescos. ? Chocolate y cacao. ? Menta y esencia de Merryvillementa. ? Ajo y cebolla. ?  Rbano picante. ? Alimentos cidos y condimentados. Estos incluyen todos los tipos de pimientos, [place] en polvo, curry en polvo, vinagre, salsas picantes y Manpower Inc. ? Ctricos y sus jugos, por ejemplo, naranjas, limones y limas. ? Alimentos que AutoNation. Estos incluyen salsa roja, [place], salsa picante y pizza con salsa de Dillard. ? Alimentos fritos y Radio broadcast assistant. Estos incluyen donas, papas fritas, papitas fritas de bolsa y aderezos con alto contenido de [place]. ? Carnes con alto contenido de [place]. Estas incluye los perros calientes, chuletas o costillas, embutidos, jamn y tocino. ? Productos lcteos ricos en grasas, como leche [place], [place] y McGraw crema.  Consuma pequeas cantidades de comida con ms frecuencia. Evite consumir porciones abundantes.  Evite beber grandes cantidades de lquidos con las comidas.  Evite comer 2 o  3horas antes de acostarse.  Evite recostarse inmediatamente despus de comer.  No haga ejercicios enseguida despus de comer. Estilo de vida   No consuma ningn producto que contenga nicotina o tabaco. Estos incluyen cigarrillos, cigarrillos electrnicos y tabaco para Higher education careers adviser. Si necesita ayuda para dejar de fumar, consulte al MeadWestvaco.  Intente reducir Schering-Plough de estrs. Si necesita ayuda para hacer esto, consulte al mdico.  Si tiene sobrepeso, baje una cantidad de peso saludable para usted. Consulte a su mdico para bajar de peso de Cisco. Indicaciones generales  Est atento a cualquier cambio en los sntomas.  Tome los medicamentos de venta libre y los recetados solamente como se lo haya indicado el mdico. No tome aspirina, ibuprofeno ni otros AINE a menos que el mdico lo autorice.  Use ropa holgada. No use nada apretado alrededor de la cintura.  Levante (eleve) la cabecera de la cama aproximadamente 6pulgadas (15cm).  Evite inclinarse si al hacerlo empeoran los sntomas.  Concurra a todas las visitas de seguimiento como se lo haya indicado el mdico. Esto es importante. Comunquese con un mdico si:  Aparecen nuevos sntomas.  Adelgaza y no sabe por qu.  Tiene problemas para tragar o le duele cuando traga.  Tiene sibilancias o tos persistente.  Los sntomas no mejoran con Dispensing optician.  Tiene la voz ronca. Solicite ayuda inmediatamente si:  Danaher Corporation, el cuello, la [place], los dientes o la espalda.  Se siente transpirado, mareado o tiene una sensacin de desvanecimiento.  Siente falta de aire o Tourist information centre manager.  Vomita y el vmito tiene un aspecto similar a la sangre o a los posos de caf.  Pierde el conocimiento (se desmaya).  Las deposiciones (heces) son sanguinolentas o negras.  No puede tragar, beber o comer. Resumen  Si una persona tiene enfermedad de reflujo gastroesofgico (ERGE), los alimentos y el cido  estomacal suben al esfago y causan sntomas o problemas tales como dao en el esfago.  El tratamiento depender de la gravedad de los sntomas.  Siga una Lexicographer se lo haya indicado el [place] todos los medicamentos solamente como se lo haya indicado el mdico. Esta informacin no tiene Marine scientist el consejo del mdico. Asegrese de hacerle al mdico cualquier pregunta que tenga. Document Released: 09/04/2010 Document Revised: 03/16/2018 Document Reviewed: 03/16/2018 Elsevier Patient Education  2020 [employer].

## 2019-03-06 LAB — CMP14+EGFR
ALT: 122 IU/L — ABNORMAL HIGH (ref 0–32)
AST: 75 IU/L — ABNORMAL HIGH (ref 0–40)
Albumin/Globulin Ratio: 1.7 (ref 1.2–2.2)
Albumin: 4.5 g/dL (ref 3.8–4.8)
Alkaline Phosphatase: 69 IU/L (ref 39–117)
BUN/Creatinine Ratio: 12 (ref 9–23)
BUN: 9 mg/dL (ref 6–24)
Bilirubin Total: 0.8 mg/dL (ref 0.0–1.2)
CO2: 19 mmol/L — ABNORMAL LOW (ref 20–29)
Calcium: 9.2 mg/dL (ref 8.7–10.2)
Chloride: 104 mmol/L (ref 96–106)
Creatinine, Ser: 0.74 mg/dL (ref 0.57–1.00)
GFR calc Af Amer: 117 mL/min/{1.73_m2} (ref >59)
GFR calc non Af Amer: 102 mL/min/{1.73_m2} (ref >59)
Globulin, Total: 2.7 g/dL (ref 1.5–4.5)
Glucose: 81 mg/dL (ref 65–99)
Potassium: 5 mmol/L (ref 3.5–5.2)
Sodium: 139 mmol/L (ref 134–144)
Total Protein: 7.2 g/dL (ref 6.0–8.5)

## 2019-03-06 LAB — CBC
Hematocrit: 43.8 % (ref 34.0–46.6)
Hemoglobin: 13.9 g/dL (ref 11.1–15.9)
MCH: 30.3 pg (ref 26.6–33.0)
MCHC: 31.7 g/dL (ref 31.5–35.7)
MCV: 95 fL (ref 79–97)
Platelets: 327 10*3/uL (ref 150–450)
RBC: 4.59 x10E6/uL (ref 3.77–5.28)
RDW: 15.1 % (ref 11.7–15.4)
WBC: 8.6 10*3/uL (ref 3.4–10.8)

## 2019-03-06 LAB — LIPID PANEL
Chol/HDL Ratio: 5.2 ratio — ABNORMAL HIGH (ref 0.0–4.4)
Cholesterol, Total: 217 mg/dL — ABNORMAL HIGH (ref 100–199)
HDL: 42 mg/dL (ref >39)
LDL Calculated: 138 mg/dL — ABNORMAL HIGH (ref 0–99)
Triglycerides: 186 mg/dL — ABNORMAL HIGH (ref 0–149)
VLDL Cholesterol Cal: 37 mg/dL (ref 5–40)

## 2019-03-06 LAB — TSH: TSH: 4.16 u[IU]/mL (ref 0.450–4.500)

## 2019-03-06 LAB — VITAMIN D 25 HYDROXY (VIT D DEFICIENCY, FRACTURES): Vit D, 25-Hydroxy: 21 ng/mL — ABNORMAL LOW (ref 30.0–100.0)

## 2019-03-08 ENCOUNTER — Encounter: Payer: Self-pay | Admitting: Nurse Practitioner

## 2019-03-13 ENCOUNTER — Telehealth: Payer: Self-pay | Admitting: Nurse Practitioner

## 2019-03-13 NOTE — Telephone Encounter (Signed)
Pt would like to be called and informed of her lab results..please follow up

## 2019-03-16 NOTE — Telephone Encounter (Signed)
CMA attempt to reach patient to inform on lab results.  Phone was not available.

## 2019-03-19 ENCOUNTER — Other Ambulatory Visit: Payer: Self-pay

## 2019-03-19 ENCOUNTER — Ambulatory Visit (HOSPITAL_COMMUNITY)
Admission: RE | Admit: 2019-03-19 | Discharge: 2019-03-19 | Disposition: A | Discharge status: Home / Self Care | Payer: Self-pay | Source: Ambulatory Visit | Attending: Nurse Practitioner | Admitting: Nurse Practitioner

## 2019-03-19 DIAGNOSIS — R7401 Elevation of levels of liver transaminase levels: Secondary | ICD-10-CM

## 2019-03-19 DIAGNOSIS — R740 Nonspecific elevation of levels of transaminase and lactic acid dehydrogenase [LDH]: Secondary | ICD-10-CM | POA: Insufficient documentation

## 2019-03-19 DIAGNOSIS — R1013 Epigastric pain: Secondary | ICD-10-CM | POA: Insufficient documentation

## 2019-03-20 ENCOUNTER — Telehealth: Payer: Self-pay | Admitting: Nurse Practitioner

## 2019-03-20 NOTE — Telephone Encounter (Signed)
Pt request return call to pt for bill assistance questions.

## 2019-03-27 ENCOUNTER — Ambulatory Visit: Payer: Self-pay | Attending: Nurse Practitioner | Admitting: Nurse Practitioner

## 2019-03-27 ENCOUNTER — Encounter: Payer: Self-pay | Admitting: Nurse Practitioner

## 2019-03-27 ENCOUNTER — Other Ambulatory Visit: Payer: Self-pay

## 2019-03-27 DIAGNOSIS — M62838 Other muscle spasm: Secondary | ICD-10-CM

## 2019-03-27 DIAGNOSIS — R399 Unspecified symptoms and signs involving the genitourinary system: Secondary | ICD-10-CM

## 2019-03-27 DIAGNOSIS — K219 Gastro-esophageal reflux disease without esophagitis: Secondary | ICD-10-CM

## 2019-03-27 NOTE — Progress Notes (Signed)
Virtual Visit via Telephone Note Due to national recommendations of social distancing due to COVID 19, telehealth visit is felt to be most appropriate for this patient at this time.  I discussed the limitations, risks, security and privacy concerns of performing an evaluation and management service by telephone and the availability of in person appointments. I also discussed with the patient that there may be a patient responsible charge related to this service. The patient expressed understanding and agreed to proceed.    I connected with Courtney CosierMiriam Leticia Constancia Edwards on 03/27/19  at   9:50 AM EDT  EDT by telephone and verified that I am speaking with the correct person using two identifiers.   Consent I discussed the limitations, risks, security and privacy concerns of performing an evaluation and management service by telephone and the availability of in person appointments. I also discussed with the patient that there may be a patient responsible charge related to this service. The patient expressed understanding and agreed to proceed.   Location of Patient: Private  Residence   [facility]    Persons participating in Telemedicine visit: Bertram DenverZelda Kelten Enochs FNP-BC YY [place] FranklinBien CMA [address]Merilyn Leticia Janeece RiggersConstancia Edwards  Spanish Interpreter EdnaNeida ID # [id number]   History of Present Illness: Telemedicine visit for: Follow up epigastric pain/GERD.  GERD She was prescribed prilosec 20mg  at her last office appointment with me on 03-05-2019. GERD symptoms include: aching, burning and pressure in the epigastric area. She denies dysphagia.  She has not lost weight. She denies melena, hematochezia, hematemesis, and coffee ground emesis.  Today she reports the prilosec has greatly improved her symptoms.   GU SYMPTOMS Patient complains of GU symptoms for 1 week. Vaginal symptoms include vulvar itching with urinary symptoms of urinary hesitancySTI Risk: Very  low risk of STD exposureDischarge described as: normal and physiologic.Other associated symptoms: none.Menstrual pattern:   Past Medical History:  Diagnosis Date  . Hypothyroidism   . Low blood pressure   . Medical history non-contributory   . Mental disorder   . Post partum depression 2002    Past Surgical History:  Procedure Laterality Date  . LAPAROSCOPY N/A 02/09/2017   Procedure: LAPAROSCOPY DIAGNOSTIC;  Surgeon: Allie Bossierove, Myra C, MD;  Location: [facility];  Service: Gynecology;  Laterality: N/A;  . NO PAST SURGERIES      Family History  Problem Relation Age of Onset  . Hypertension Mother   . Diabetes Mother   . Heart disease Mother     Social History   Socioeconomic History  . Marital status: Married    Spouse name: Not on file  . Number of children: Not on file  . Years of education: Not on file  . Highest education level: Not on file  Occupational History  . Not on file  Social Needs  . Financial resource strain: Not on file  . Food insecurity    Worry: Not on file    Inability: Not on file  . Transportation needs    Medical: Not on file    Non-medical: Not on file  Tobacco Use  . Smoking status: Never Smoker  . Smokeless tobacco: Never Used  Substance and Sexual Activity  . Alcohol use: No  . Drug use: No  . Sexual activity: Yes    Birth control/protection: None  Lifestyle  . Physical activity    Days per week: Not on file    Minutes per session: Not on file  . Stress: Not on  file  Relationships  . Social Herbalist on phone: Not on file    Gets together: Not on file    Attends religious service: Not on file    Active member of club or organization: Not on file    Attends meetings of clubs or organizations: Not on file    Relationship status: Not on file  Other Topics Concern  . Not on file  Social History Narrative  . Not on file     Observations/Objective: Awake, alert and oriented x 3   Review of Systems  Constitutional: Negative  for fever, malaise/fatigue and weight loss.  HENT: Negative.  Negative for nosebleeds.   Eyes: Negative.  Negative for blurred vision, double vision and photophobia.  Respiratory: Negative.  Negative for cough and shortness of breath.   Cardiovascular: Negative.  Negative for chest pain, palpitations and leg swelling.  Gastrointestinal: Positive for heartburn. Negative for nausea and vomiting.  Genitourinary: Positive for urgency. Negative for dysuria, flank pain, frequency and hematuria.       SEE HPI  Musculoskeletal: Positive for myalgias.  Neurological: Negative.  Negative for dizziness, focal weakness, seizures and headaches.  Psychiatric/Behavioral: Negative.  Negative for suicidal ideas.   Diagnoses and all orders for this visit:  Gastroesophageal reflux disease, esophagitis presence not specified Continue prilosec as prescribed.  INSTRUCTIONS: Avoid GERD Triggers: acidic, spicy or fried foods, caffeine, coffee, sodas,  alcohol and chocolate.    Muscle spasm Ambulatory referral to Physical Therapy  GU Symptoms UA POCT Wet PREP   Follow Up Instructions Return for Physical ONLY no labs.     I discussed the assessment and treatment plan with the patient. The patient was provided an opportunity to ask questions and all were answered. The patient agreed with the plan and demonstrated an understanding of the instructions.   The patient was advised to call back or seek an in-person evaluation if the symptoms worsen or if the condition fails to improve as anticipated.  I provided 21 minutes of non-face-to-face time during this encounter including median intraservice time, reviewing previous notes, labs, imaging, medications and explaining diagnosis and management.  Gildardo Pounds, FNP-BC

## 2019-03-28 ENCOUNTER — Other Ambulatory Visit: Payer: Self-pay

## 2019-03-28 ENCOUNTER — Ambulatory Visit: Payer: Self-pay | Attending: Nurse Practitioner

## 2019-03-28 DIAGNOSIS — R399 Unspecified symptoms and signs involving the genitourinary system: Secondary | ICD-10-CM

## 2019-03-28 LAB — POCT URINALYSIS DIPSTICK
Bilirubin, UA: NEGATIVE
Blood, UA: NEGATIVE
Glucose, UA: NEGATIVE
Ketones, UA: NEGATIVE
Leukocytes, UA: NEGATIVE
Nitrite, UA: NEGATIVE
Protein, UA: NEGATIVE
Spec Grav, UA: 1.015 (ref 1.010–1.025)
Urobilinogen, UA: 1 E.U./dL
pH, UA: 6 (ref 5.0–8.0)

## 2019-03-29 LAB — URINALYSIS, COMPLETE
Bilirubin, UA: NEGATIVE
Glucose, UA: NEGATIVE
Ketones, UA: NEGATIVE
Nitrite, UA: NEGATIVE
Protein,UA: NEGATIVE
RBC, UA: NEGATIVE
Specific Gravity, UA: 1.027 (ref 1.005–1.030)
Urobilinogen, Ur: 0.2 mg/dL (ref 0.2–1.0)
pH, UA: 5.5 (ref 5.0–7.5)

## 2019-03-29 LAB — MICROSCOPIC EXAMINATION
Casts: NONE SEEN /lpf
Epithelial Cells (non renal): >10 /hpf — AB (ref 0–10)

## 2019-03-30 LAB — CERVICOVAGINAL ANCILLARY ONLY
Bacterial vaginitis: NEGATIVE
Candida vaginitis: NEGATIVE
Chlamydia: NEGATIVE
Neisseria Gonorrhea: NEGATIVE
Trichomonas: NEGATIVE

## 2019-04-04 MED FILL — ?LEVOTHYROXINE 25 MCG TABLE: 25 | 30 days supply | Qty: 30 | Fill #1

## 2019-04-04 MED FILL — ?OMEPRAZOLE 20MG CAP DR: 20 | 30 days supply | Qty: 30 | Fill #1

## 2019-04-10 ENCOUNTER — Encounter: Payer: Self-pay | Admitting: Physical Therapy

## 2019-04-10 ENCOUNTER — Other Ambulatory Visit: Payer: Self-pay

## 2019-04-10 ENCOUNTER — Ambulatory Visit: Payer: Self-pay | Attending: Nurse Practitioner | Admitting: Physical Therapy

## 2019-04-10 DIAGNOSIS — G8929 Other chronic pain: Secondary | ICD-10-CM | POA: Insufficient documentation

## 2019-04-10 DIAGNOSIS — M545 Low back pain: Secondary | ICD-10-CM | POA: Insufficient documentation

## 2019-04-10 DIAGNOSIS — M25562 Pain in left knee: Secondary | ICD-10-CM | POA: Insufficient documentation

## 2019-04-10 DIAGNOSIS — M25561 Pain in right knee: Secondary | ICD-10-CM | POA: Insufficient documentation

## 2019-04-10 DIAGNOSIS — R262 Difficulty in walking, not elsewhere classified: Secondary | ICD-10-CM | POA: Insufficient documentation

## 2019-04-10 NOTE — Therapy (Signed)
[facility] [address] [place], [place], [address] Phone: [phone]   Fax:  [phone]  Physical Therapy Evaluation  Patient Details  Name: Courtney Edwards MRN: [id number] Date of Birth: 12/01/1975 Referring Provider (PT): Loreen FreudZelda Flemming MD    Encounter Date: 04/10/2019  PT End of Session - 04/10/19 1317    Visit Number  1    Number of Visits  12    Date for PT Re-Evaluation  05/22/19    Authorization Type  CAFA 1/21    PT Start Time  0930    PT Stop Time  1013    PT Time Calculation (min)  43 min    Activity Tolerance  Patient tolerated treatment well    Behavior During Therapy  [facility] for tasks assessed/performed       Past Medical History:  Diagnosis Date  . Hypothyroidism   . Low blood pressure   . Medical history non-contributory   . Mental disorder   . Post partum depression 2002    Past Surgical History:  Procedure Laterality Date  . LAPAROSCOPY N/A 02/09/2017   Procedure: LAPAROSCOPY DIAGNOSTIC;  Surgeon: Allie Bossierove, Myra C, MD;  Location: [facility];  Service: Gynecology;  Laterality: N/A;  . NO PAST SURGERIES      There were no vitals filed for this visit.   Subjective Assessment - 04/10/19 0942    Subjective  Patient has been having bilateral knee pain for 4 years now. She has increased pain when she stands and walks. For 4 months she has had difficulty walking even short distances or going up the stairs.  The right knee is worse then the other. The patient has been having lower back pain for 6 years. She feels like the pain is deep in her back. The pain started when she had her child 6 years ago. The pain is worse when she stands and improves when she lies on her side.    Patient is accompained by:  Interpreter    How long can you stand comfortably?  < a few minutes before she starts to have pain    How long can you walk comfortably?  limited community distances    Diagnostic tests  X-ray:    Currently in Pain?  Yes    Pain Score  7     Pain Location  Knee    Pain Orientation  Right    Pain Descriptors / Indicators  Aching    Pain Type  Chronic pain    Pain Onset  More than a month ago    Pain Frequency  Constant    Aggravating Factors   Standing, steps    Pain Relieving Factors  rest    Effect of Pain on Daily Activities  difficulty standing and walking    Multiple Pain Sites  Yes    Pain Score  5    Pain Location  Back    Pain Orientation  Right;Left;Lower    Pain Descriptors / Indicators  Aching    Pain Type  Chronic pain    Pain Onset  More than a month ago    Pain Frequency  Constant    Aggravating Factors   standing and walking    Pain Relieving Factors  rest    Effect of Pain on Daily Activities  difficulty perfroming daily tasks         [facility] - 04/10/19 0001      Assessment   Medical Diagnosis  Low back pain/ Bilateral knee pain     Referring Provider (PT)  Loreen FreudZelda Flemming MD     Onset Date/Surgical Date  --   knees 4 years, low back 6 years    Hand Dominance  Right    Prior Therapy  No       Precautions   Precautions  None      Restrictions   Weight Bearing Restrictions  No      Balance Screen   Has the patient fallen in the past 6 months  Yes    How many times?  3   Patient stepped on her sons toys    Has the patient had a decrease in activity level because of a fear of falling?   Yes    Is the patient reluctant to leave their home because of a fear of falling?   Yes      Home Environment   Additional Comments  No steps in her house       Prior Function   Level of Independence  Independent    Vocation  Unemployed    Leisure  Nothing       Cognition   Overall Cognitive Status  Within Functional Limits for tasks assessed    Attention  Focused    Focused Attention  Appears intact    Memory  Appears intact    Awareness  Appears intact    Problem Solving  Appears intact      Observation/Other Assessments   Observations   High arches     Focus on Therapeutic Outcomes (FOTO)   could not comprehend questions/ Patient also late       Sensation   Light Touch  Appears Intact      Coordination   Gross Motor Movements are Fluid and Coordinated  Yes    Fine Motor Movements are Fluid and Coordinated  Yes      ROM / Strength   AROM / PROM / Strength  AROM;PROM;Strength      AROM   AROM Assessment Site  Lumbar    Lumbar Flexion  has full flexion but pain towards end range     Lumbar Extension  pain with extension limited 25%     Lumbar - Right Rotation  No pain     Lumbar - Left Rotation  No pain       Strength   Overall Strength Comments  limited ability to contract core     Strength Assessment Site  Knee;Hip    Right/Left Hip  Right;Left    Right Hip Flexion  4/5    Right Hip ABduction  4+/5    Right Hip ADduction  4+/5    Left Hip Flexion  4/5    Left Hip ABduction  4+/5    Left Hip ADduction  4+/5    Right/Left Knee  Right;Left    Right Knee Flexion  4/5    Right Knee Extension  4/5    Left Knee Flexion  4/5    Left Knee Extension  4/5      Palpation   Palpation comment  tightness in bilateral lower lumbar paraspinals       Ambulation/Gait   Gait Comments  decreased hip flexion ilateral; shifts away from the right leg., bilateral toe out                 Objective measurements completed on examination: See above findings.      [facility] Adult PT Treatment/Exercise -  04/10/19 0001      Lumbar Exercises: Standing   Other Standing Lumbar Exercises  tennis ball trigger point release       Lumbar Exercises: Supine   Pelvic Tilt Limitations  x10 with abdominal breathing       Knee/Hip Exercises: Supine   Quad Sets Limitations  x10     Other Supine Knee/Hip Exercises  reviewed patella mobility              PT Education - 04/10/19 1316    Education Details  HEP, symptom mangement; patellar mobilization    Person(s) Educated  Patient    Methods   Explanation;Demonstration;Verbal cues;Tactile cues    Comprehension  Verbalized understanding;Returned demonstration;Tactile cues required;Verbal cues required       PT Short Term Goals - 04/10/19 1537      PT SHORT TERM GOAL #1   Title  Patient will be indpednent with basic HEP for patella mobilization, quad and core strengthening    Time  3    Period  Weeks    Status  New    Target Date  05/01/19      PT SHORT TERM GOAL #2   Title  Patient will increase gross bilateral lower extremity strength to 4+/5    Time  3    Period  Weeks    Status  New    Target Date  05/01/19      PT SHORT TERM GOAL #3   Title  Patient will increase standing tolerance to 15 minutes without increased back pain    Time  3    Period  Weeks    Status  New    Target Date  05/01/19        PT Long Term Goals - 04/10/19 1542      PT LONG TERM GOAL #1   Title  Patient will go up and down 12 steps without pain    Time  6    Period  Weeks    Status  New      PT LONG TERM GOAL #2   Title  Patient will stand for 45 minutes without pain in order to perfrom ADL's    Time  6    Period  Weeks    Status  New             Plan - 04/10/19 1321    Clinical Impression Statement  Patient is a 43 year old female with bilateral knee and lower back pain. She has spasming in her lower back. She has difficulty standing for too long. She reports she has had the pain since pregnancy. She has core weakness Herknees hurt when she stands too long as well. She has decreased hip strength and decreased patella movement. She has a positive meniscal sign but she had general soreness in her knee as well. she would benefit from a strengthening rpogram as well as patella mobilization. She would also benefit from a caore strengthening program.    Personal Factors and Comorbidities  Comorbidity 1;Comorbidity 2    Comorbidities  history of low back pain and    Examination-Activity Limitations  Locomotion  Level;Transfers;Stand;Squat    Examination-Participation Restrictions  Meal Prep    Stability/Clinical Decision Making  Evolving/Moderate complexity    Clinical Decision Making  Moderate    Rehab Potential  Good    PT Frequency  2x / week    PT Duration  8 weeks    PT Treatment/Interventions  ADLs/Self Care  Home Management;Cryotherapy;Electrical Stimulation;Iontophoresis 4mg /ml Dexamethasone;Moist Heat;Ultrasound;DME Instruction;Gait training;Stair training;Functional mobility training;Therapeutic activities;Therapeutic exercise;Patient/family education;Neuromuscular re-education;Manual techniques;Passive range of motion    PT Next Visit Plan  review HEP, continue with patella mobilization; quad sets, SAQ, SLR if able, supine march, clam shell; soft tissue mobilization to lumbar spine , review PPT, consider bridge,    PT Home Exercise Plan  quad set, patella mob, tennis ball trigger point release, posterior pelvic tilt    Consulted and Agree with Plan of Care  Patient       Patient will benefit from skilled therapeutic intervention in order to improve the following deficits and impairments:  Abnormal gait, Increased muscle spasms, Decreased endurance, Difficulty walking, Decreased activity tolerance, Decreased mobility, Decreased strength, Increased edema  Visit Diagnosis: Chronic pain of right knee  Chronic bilateral low back pain without sciatica  Chronic pain of left knee  Difficulty in walking, not elsewhere classified     Problem List Patient Active Problem List   Diagnosis Date Noted  . Pterygium of left eye 03/01/2018  . Chronic left shoulder pain 04/19/2017  . Chronic back pain greater than 3 months duration 04/19/2017  . Dyslipidemia 04/19/2017  . Hypothyroidism 04/07/2017  . HLD (hyperlipidemia) 02/06/2015  . Dyspareunia 05/20/2014  . Unspecified constipation 08/06/2013  . Onychomycosis 08/06/2013  . Hemorrhoid 08/06/2013  . GERD (gastroesophageal reflux disease)  08/06/2013  . Muscle ache 08/06/2013  . LOW BACK PAIN, MILD 03/02/2007  . HYPERGLYCEMIA 11/03/2006    Carney Living PT DPT  04/10/2019, 4:39 PM  [facility] [address] [place], [place], [address] Phone: [phone]   Fax:  [phone]  Name: Courtney Edwards MRN: [id number] Date of Birth: [birth year]

## 2019-04-18 ENCOUNTER — Ambulatory Visit (HOSPITAL_COMMUNITY)
Admission: EM | Admit: 2019-04-18 | Discharge: 2019-04-18 | Disposition: A | Discharge status: Home / Self Care | Payer: Self-pay | Attending: Emergency Medicine | Admitting: Emergency Medicine

## 2019-04-18 ENCOUNTER — Other Ambulatory Visit: Payer: Self-pay

## 2019-04-18 ENCOUNTER — Encounter (HOSPITAL_COMMUNITY): Payer: Self-pay

## 2019-04-18 DIAGNOSIS — H1032 Unspecified acute conjunctivitis, left eye: Secondary | ICD-10-CM

## 2019-04-18 MED ORDER — POLYMYXIN B-TRIMETHOPRIM 10000-0.1 UNIT/ML-% OP SOLN: 1.0000 [drp] | Freq: Four times a day (QID) | OPHTHALMIC | 0 refills | Status: AC

## 2019-04-18 MED FILL — ?ATORVASTATIN 40MG TABLET: 40 | 30 days supply | Qty: 30 | Fill #1

## 2019-04-18 MED FILL — POLYMYXIN B/TMP EYE DROPS: 10000-0.1 | 18 days supply | Qty: 10 | Fill #0

## 2019-04-18 NOTE — ED Provider Notes (Signed)
[place]    CSN: [id number] Arrival date & time: 04/18/19  1249      History   Chief Complaint Chief Complaint  Patient presents with  . Conjunctivitis    HPI Courtney Edwards is a 43 y.o. female.   Patient presents with redness, discomfort, green drainage from her left eye since she woke up this morning.  She denies injury.  She denies eye pain or change in her vision.  She denies other symptoms such as congestion, rhinorrhea, sore throat, cough, fever.  The history is provided by the patient. A language interpreter was used.    Past Medical History:  Diagnosis Date  . Hypothyroidism   . Low blood pressure   . Medical history non-contributory   . Mental disorder   . Post partum depression 2002    Patient Active Problem List   Diagnosis Date Noted  . Pterygium of left eye 03/01/2018  . Chronic left shoulder pain 04/19/2017  . Chronic back pain greater than 3 months duration 04/19/2017  . Dyslipidemia 04/19/2017  . Hypothyroidism 04/07/2017  . HLD (hyperlipidemia) 02/06/2015  . Dyspareunia 05/20/2014  . Unspecified constipation 08/06/2013  . Onychomycosis 08/06/2013  . Hemorrhoid 08/06/2013  . GERD (gastroesophageal reflux disease) 08/06/2013  . Muscle ache 08/06/2013  . LOW BACK PAIN, MILD 03/02/2007  . HYPERGLYCEMIA 11/03/2006    Past Surgical History:  Procedure Laterality Date  . LAPAROSCOPY N/A 02/09/2017   Procedure: LAPAROSCOPY DIAGNOSTIC;  Surgeon: Emily Filbert, MD;  Location: [facility];  Service: Gynecology;  Laterality: N/A;  . NO PAST SURGERIES      OB History    Gravida  5   Para  5   Term  5   Preterm  0   AB  0   Living  5     SAB  0   TAB  0   Ectopic      Multiple  0   Live Births  1            Home Medications    Prior to Admission medications   Medication Sig Start Date End Date Taking? Authorizing Provider  atorvastatin (LIPITOR) 40 MG tablet Take 1 tablet (40 mg total) by mouth daily  at 6 PM. 03/05/19   Gildardo Pounds, NP  levothyroxine (SYNTHROID) 25 MCG tablet Take 1 tablet (25 mcg total) by mouth daily before breakfast. 03/05/19   Gildardo Pounds, NP  omega-3 acid ethyl esters (LOVAZA) 1 g capsule Take 2 capsules (2 g total) by mouth 2 (two) times daily. 03/05/19 04/04/19  Gildardo Pounds, NP  omeprazole (PRILOSEC) 20 MG capsule Take 1 capsule (20 mg total) by mouth daily. 03/05/19 06/03/19  Gildardo Pounds, NP  trimethoprim-polymyxin b (POLYTRIM) ophthalmic solution Place 1 drop into both eyes 4 (four) times daily for 7 days. 04/18/19 04/25/19  Sharion Balloon, NP  Vitamin D, Ergocalciferol, (DRISDOL) 1.25 MG (50000 UT) CAPS capsule Take 1 capsule (50,000 Units total) by mouth every 7 (seven) days. Patient not taking: Reported on 03/05/2019 09/01/18   Argentina Donovan, PA-C    Family History Family History  Problem Relation Age of Onset  . Hypertension Mother   . Diabetes Mother   . Heart disease Mother     Social History Social History   Tobacco Use  . Smoking status: Never Smoker  . Smokeless tobacco: Never Used  Substance Use Topics  . Alcohol use: No  . Drug use: No  Allergies   Patient has no known allergies.   Review of Systems Review of Systems  Constitutional: Negative for chills and fever.  HENT: Negative for ear pain, rhinorrhea and sore throat.   Eyes: Positive for discharge and redness. Negative for pain and visual disturbance.  Respiratory: Negative for cough and shortness of breath.   Cardiovascular: Negative for chest pain and palpitations.  Gastrointestinal: Negative for abdominal pain and vomiting.  Genitourinary: Negative for dysuria and hematuria.  Musculoskeletal: Negative for arthralgias and back pain.  Skin: Negative for color change and rash.  Neurological: Negative for seizures and syncope.  All other systems reviewed and are negative.    Physical Exam Triage Vital Signs ED Triage Vitals  Enc Vitals Group     BP  04/18/19 1344 107/80     Pulse Rate 04/18/19 1344 71     Resp 04/18/19 1344 18     Temp 04/18/19 1344 98.4 F (36.9 C)     Temp src --      SpO2 04/18/19 1344 100 %     Weight 04/18/19 1342 180 lb (81.6 kg)     Height --      Head Circumference --      Peak Flow --      Pain Score 04/18/19 1342 2     Pain Loc --      Pain Edu? --      Excl. in GC? --    No data found.  Updated Vital Signs BP 107/80 (BP Location: Right Arm)   Pulse 71   Temp 98.4 F (36.9 C)   Resp 18   Wt 180 lb (81.6 kg)   LMP 04/15/2019   SpO2 100%   BMI 29.95 kg/m   Visual Acuity Right Eye Distance:   Left Eye Distance:   Bilateral Distance:    Right Eye Near:   Left Eye Near:    Bilateral Near:     Physical Exam Vitals signs and nursing note reviewed.  Constitutional:      General: She is not in acute distress.    Appearance: She is well-developed.  HENT:     Head: Normocephalic and atraumatic.     Right Ear: Tympanic membrane normal.     Left Ear: Tympanic membrane normal.     Mouth/Throat:     Mouth: Mucous membranes are moist.     Pharynx: Oropharynx is clear.  Eyes:     Extraocular Movements: Extraocular movements intact.     Conjunctiva/sclera:     Left eye: Left conjunctiva is injected.     Pupils: Pupils are equal, round, and reactive to light.  Neck:     Musculoskeletal: Neck supple.  Cardiovascular:     Rate and Rhythm: Normal rate and regular rhythm.     Heart sounds: No murmur.  Pulmonary:     Effort: Pulmonary effort is normal. No respiratory distress.     Breath sounds: Normal breath sounds.  Abdominal:     Palpations: Abdomen is soft.     Tenderness: There is no abdominal tenderness. There is no guarding or rebound.  Skin:    General: Skin is warm and dry.     Findings: No rash.  Neurological:     Mental Status: She is alert.      UC Treatments / Results  Labs (all labs ordered are listed, but only abnormal results are displayed) Labs Reviewed - No data  to display  EKG   Radiology No results found.  Procedures Procedures (including critical care time)  Medications Ordered in UC Medications - No data to display  Initial Impression / Assessment and Plan / UC Course  I have reviewed the triage vital signs and the nursing notes.  Pertinent labs & imaging results that were available during my care of the patient were reviewed by me and considered in my medical decision making (see chart for details).     Conjunctivitis, left eye.  Treating with Polytrim eyedrops.  Instructed patient to return here or follow-up with the suggested eye doctor if her symptoms persist or worsen.  Instructed patient to go to the emergency department if she develops acute eye pain or change in her vision.  Patient agrees with plan of care.     Final Clinical Impressions(s) / UC Diagnoses   Final diagnoses:  Acute bacterial conjunctivitis of left eye     Discharge Instructions     Use the eyedrops as prescribed.    Return here or go to the eye doctor if your eye symptoms continue or worsen.    Go to the emergency department if you develop acute eye pain or change in your vision.        ED Prescriptions    Medication Sig Dispense Auth. Provider   trimethoprim-polymyxin b (POLYTRIM) ophthalmic solution Place 1 drop into both eyes 4 (four) times daily for 7 days. 10 mL Mickie Bail, NP     Controlled Substance Prescriptions [place] Controlled Substance Registry consulted? Not Applicable   Mickie Bail, NP 04/18/19 [phone]

## 2019-04-18 NOTE — Discharge Instructions (Addendum)
Use the eyedrops as prescribed.    Return here or go to the eye doctor if your eye symptoms continue or worsen.    Go to the emergency department if you develop acute eye pain or change in your vision.

## 2019-04-18 NOTE — ED Triage Notes (Signed)
Pt states when she woke up this morning her left eye is red and painful.

## 2019-04-19 ENCOUNTER — Other Ambulatory Visit: Payer: Self-pay

## 2019-04-19 ENCOUNTER — Ambulatory Visit: Payer: Self-pay | Attending: Nurse Practitioner | Admitting: Physical Therapy

## 2019-04-19 DIAGNOSIS — R262 Difficulty in walking, not elsewhere classified: Secondary | ICD-10-CM | POA: Insufficient documentation

## 2019-04-19 DIAGNOSIS — M545 Low back pain, unspecified: Secondary | ICD-10-CM

## 2019-04-19 DIAGNOSIS — G8929 Other chronic pain: Secondary | ICD-10-CM | POA: Insufficient documentation

## 2019-04-19 DIAGNOSIS — M25562 Pain in left knee: Secondary | ICD-10-CM | POA: Insufficient documentation

## 2019-04-19 DIAGNOSIS — M6281 Muscle weakness (generalized): Secondary | ICD-10-CM | POA: Insufficient documentation

## 2019-04-19 DIAGNOSIS — M25561 Pain in right knee: Secondary | ICD-10-CM | POA: Insufficient documentation

## 2019-04-19 DIAGNOSIS — R252 Cramp and spasm: Secondary | ICD-10-CM | POA: Insufficient documentation

## 2019-04-19 NOTE — Therapy (Signed)
[facility] [address] [place], [place], [address] Phone: [phone]   Fax:  [phone]  Physical Therapy Treatment  Patient Details  Name: Aggie CosierMiriam Leticia Constancia [name] MRN: [id number] Date of Birth: 11/07/1975 Referring Provider (PT): Loreen FreudZelda Flemming MD    Encounter Date: 04/19/2019  PT End of Session - 04/19/19 0835    Visit Number  2    Number of Visits  12    Date for PT Re-Evaluation  05/22/19    Authorization Type  CAFA 1/21    PT Start Time  0745    PT Stop Time  0835    PT Time Calculation (min)  50 min    Activity Tolerance  Patient tolerated treatment well    Behavior During Therapy  [facility] for tasks assessed/performed       Past Medical History:  Diagnosis Date  . Hypothyroidism   . Low blood pressure   . Medical history non-contributory   . Mental disorder   . Post partum depression 2002    Past Surgical History:  Procedure Laterality Date  . LAPAROSCOPY N/A 02/09/2017   Procedure: LAPAROSCOPY DIAGNOSTIC;  Surgeon: Allie Bossierove, Myra C, MD;  Location: [facility];  Service: Gynecology;  Laterality: N/A;  . NO PAST SURGERIES      There were no vitals filed for this visit.  Subjective Assessment - 04/19/19 0802    Subjective  Relays pain is 8/10 in her knees and bother her the most when she is standing, walking,and doing house chores. Her back pain is less today She says the tennis ball massage at home for her back really helped    Patient is accompained by:  Interpreter    How long can you stand comfortably?  < a few minutes before she starts to have pain    How long can you walk comfortably?  limited community distances    Diagnostic tests  X-ray:    Pain Onset  More than a month ago    Pain Onset  More than a month ago                       [facility] - 04/19/19 0001      Exercises   Exercises  Lumbar;Knee/Hip      Lumbar Exercises: Stretches   Single Knee to Chest  Stretch  Right;Left;2 reps;30 seconds    Lower Trunk Rotation Limitations  5 sec X 10 each side    Pelvic Tilt  --      Knee/Hip Exercises: Stretches   Active Hamstring Stretch  Right;Left;2 reps;30 seconds    Quad Stretch Limitations  prone Rt, left 3X30 sec with strap      Knee/Hip Exercises: Aerobic   Nustep  5 min L2 UE/LE      Knee/Hip Exercises: Supine   Quad Sets Limitations  15    Short Arc Quad Sets Limitations  15    Bridges  15 reps    Straight Leg Raises  Right;Left;15 reps    Straight Leg Raises Limitations  mild pain in low back    Other Supine Knee/Hip Exercises  --      Modalities   Modalities  Cryotherapy      Cryotherapy   Number Minutes Cryotherapy  5 Minutes    Cryotherapy Location  Knee    Type of Cryotherapy  Ice pack      Manual Therapy   Manual therapy comments  patella mobs, knee  P-A mobs in 90 deg, KT tape               PT Short Term Goals - 04/10/19 1537      PT SHORT TERM GOAL #1   Title  Patient will be indpednent with basic HEP for patella mobilization, quad and core strengthening    Time  3    Period  Weeks    Status  New    Target Date  05/01/19      PT SHORT TERM GOAL #2   Title  Patient will increase gross bilateral lower extremity strength to 4+/5    Time  3    Period  Weeks    Status  New    Target Date  05/01/19      PT SHORT TERM GOAL #3   Title  Patient will increase standing tolerance to 15 minutes without increased back pain    Time  3    Period  Weeks    Status  New    Target Date  05/01/19        PT Long Term Goals - 04/10/19 1542      PT LONG TERM GOAL #1   Title  Patient will go up and down 12 steps without pain    Time  6    Period  Weeks    Status  New      PT LONG TERM GOAL #2   Title  Patient will stand for 45 minutes without pain in order to perfrom ADL's    Time  6    Period  Weeks    Status  New            Plan - 04/19/19 8242    Clinical Impression Statement  Pt was treated  with stretching and strengthening to her knees and back. Continued patella mobs and Rt knee is less mobile than Lt. She had good tolerance to exercises but had some mild back pain with SLR. She was trialed with KT tape and ice at end in efforts to decrease pain.    Personal Factors and Comorbidities  Comorbidity 1;Comorbidity 2    Comorbidities  history of low back pain and    Examination-Activity Limitations  Locomotion Level;Transfers;Stand;Squat    Examination-Participation Restrictions  Meal Prep    Stability/Clinical Decision Making  Evolving/Moderate complexity    Rehab Potential  Good    PT Frequency  2x / week    PT Duration  8 weeks    PT Treatment/Interventions  ADLs/Self Care Home Management;Cryotherapy;Electrical Stimulation;Iontophoresis 4mg /ml Dexamethasone;Moist Heat;Ultrasound;DME Instruction;Gait training;Stair training;Functional mobility training;Therapeutic activities;Therapeutic exercise;Patient/family education;Neuromuscular re-education;Manual techniques;Passive range of motion    PT Next Visit Plan  how was KT tape? continue with patella mobilization; quad sets, SAQ, SLR if able, supine march, clam shell; soft tissue mobilization to lumbar spine , review PPT, consider bridge,    PT Home Exercise Plan  quad set, patella mob, tennis ball trigger point release, posterior pelvic tilt    Consulted and Agree with Plan of Care  Patient       Patient will benefit from skilled therapeutic intervention in order to improve the following deficits and impairments:  Abnormal gait, Increased muscle spasms, Decreased endurance, Difficulty walking, Decreased activity tolerance, Decreased mobility, Decreased strength, Increased edema  Visit Diagnosis: Chronic pain of right knee  Chronic bilateral low back pain without sciatica  Chronic pain of left knee  Difficulty in walking, not elsewhere classified     Problem List  Patient Active Problem List   Diagnosis Date Noted  .  Pterygium of left eye 03/01/2018  . Chronic left shoulder pain 04/19/2017  . Chronic back pain greater than 3 months duration 04/19/2017  . Dyslipidemia 04/19/2017  . Hypothyroidism 04/07/2017  . HLD (hyperlipidemia) 02/06/2015  . Dyspareunia 05/20/2014  . Unspecified constipation 08/06/2013  . Onychomycosis 08/06/2013  . Hemorrhoid 08/06/2013  . GERD (gastroesophageal reflux disease) 08/06/2013  . Muscle ache 08/06/2013  . LOW BACK PAIN, MILD 03/02/2007  . HYPERGLYCEMIA 11/03/2006    Birdie Riddle 04/19/2019, 9:31 AM  [facility] [address] [place], [place], [address] Phone: [phone]   Fax:  [phone]  Name: Arionne Pharris MRN: [id number] Date of Birth: 1976/05/12

## 2019-04-24 ENCOUNTER — Ambulatory Visit: Payer: Self-pay | Admitting: Physical Therapy

## 2019-04-24 ENCOUNTER — Other Ambulatory Visit: Payer: Self-pay

## 2019-04-24 DIAGNOSIS — R262 Difficulty in walking, not elsewhere classified: Secondary | ICD-10-CM

## 2019-04-24 DIAGNOSIS — G8929 Other chronic pain: Secondary | ICD-10-CM

## 2019-04-24 DIAGNOSIS — R252 Cramp and spasm: Secondary | ICD-10-CM

## 2019-04-24 DIAGNOSIS — M25562 Pain in left knee: Secondary | ICD-10-CM

## 2019-04-24 DIAGNOSIS — M6281 Muscle weakness (generalized): Secondary | ICD-10-CM

## 2019-04-24 DIAGNOSIS — M545 Low back pain, unspecified: Secondary | ICD-10-CM

## 2019-04-24 NOTE — Therapy (Signed)
[facility] [address] [place], [place], [address] Phone: [phone]   Fax:  [phone]  Physical Therapy Treatment  Patient Details  Name: Courtney Edwards MRN: [id number] Date of Birth: 23-Dec-1975 Referring Provider (PT): Loreen Freud MD    Encounter Date: 04/24/2019  PT End of Session - 04/24/19 1709    Visit Number  3    Number of Visits  12    Date for PT Re-Evaluation  05/22/19    Authorization Type  CAFA 1/21    PT Start Time  1615    PT Stop Time  1700    PT Time Calculation (min)  45 min    Activity Tolerance  Patient tolerated treatment well    Behavior During Therapy  [facility] for tasks assessed/performed       Past Medical History:  Diagnosis Date  . Hypothyroidism   . Low blood pressure   . Medical history non-contributory   . Mental disorder   . Post partum depression 2002    Past Surgical History:  Procedure Laterality Date  . LAPAROSCOPY N/A 02/09/2017   Procedure: LAPAROSCOPY DIAGNOSTIC;  Surgeon: Allie Bossier, MD;  Location: [facility];  Service: Gynecology;  Laterality: N/A;  . NO PAST SURGERIES      There were no vitals filed for this visit.  Subjective Assessment - 04/24/19 1638    Subjective  reports the tape helped a little but came off in 3 days, my pain is still the same    Patient is accompained by:  Interpreter   Dayna [phone]   Currently in Pain?  Yes    Pain Score  8     Pain Location  Knee    Pain Orientation  Right;Left    Pain Descriptors / Indicators  Aching    Pain Type  Chronic pain                       [facility] Adult PT Treatment/Exercise - 04/24/19 0001      Lumbar Exercises: Stretches   Single Knee to Chest Stretch  Right;Left;2 reps;30 seconds    Other Lumbar Stretch Exercise  tried POE but did not improve pain and she had some mild pain with this    Other Lumbar Stretch Exercise  sidelying lumbar-thoracic rotation stretch 5 sec X 15 each side       Lumbar Exercises: Aerobic   Recumbent Bike  5 min no resistance      Lumbar Exercises: Supine   Bridge  15 reps      Knee/Hip Exercises: Stretches   Active Hamstring Stretch  Right;Left;2 reps;30 seconds    Quad Stretch Limitations  prone Rt, left 2X30 sec with strap      Knee/Hip Exercises: Supine   Bridges  20 reps      Manual Therapy   Manual therapy comments  STM/IASTM to knees bilat with biforeeze mixed in to massage cream, LAD mobs for lumbar decompression unilat on both sides, attemped sidelying rotational mobs but had pain with this so just did self thoracic rotation stretching instead               PT Short Term Goals - 04/10/19 1537      PT SHORT TERM GOAL #1   Title  Patient will be indpednent with basic HEP for patella mobilization, quad and core strengthening    Time  3    Period  Weeks    Status  New    Target Date  05/01/19      PT SHORT TERM GOAL #2   Title  Patient will increase gross bilateral lower extremity strength to 4+/5    Time  3    Period  Weeks    Status  New    Target Date  05/01/19      PT SHORT TERM GOAL #3   Title  Patient will increase standing tolerance to 15 minutes without increased back pain    Time  3    Period  Weeks    Status  New    Target Date  05/01/19        PT Long Term Goals - 04/10/19 1542      PT LONG TERM GOAL #1   Title  Patient will go up and down 12 steps without pain    Time  6    Period  Weeks    Status  New      PT LONG TERM GOAL #2   Title  Patient will stand for 45 minutes without pain in order to perfrom ADL's    Time  6    Period  Weeks    Status  New            Plan - 04/24/19 1709    Clinical Impression Statement  Trialed more Manual therapy today for STM/IASTM to her knees as well as LAD for lumbar pain (unable to perform lumbar rotational mobs due to pain). Tried POE for extension but did not help pain and actually caused some pain. Finished with ice to calm down pain. Assess  her response to manual therapy next visit    Personal Factors and Comorbidities  Comorbidity 1;Comorbidity 2    Comorbidities  history of low back pain and    Examination-Activity Limitations  Locomotion Level;Transfers;Stand;Squat    Examination-Participation Restrictions  Meal Prep    Stability/Clinical Decision Making  Evolving/Moderate complexity    Rehab Potential  Good    PT Frequency  2x / week    PT Duration  8 weeks    PT Treatment/Interventions  ADLs/Self Care Home Management;Cryotherapy;Electrical Stimulation;Iontophoresis 4mg /ml Dexamethasone;Moist Heat;Ultrasound;DME Instruction;Gait training;Stair training;Functional mobility training;Therapeutic activities;Therapeutic exercise;Patient/family education;Neuromuscular re-education;Manual techniques;Passive range of motion    PT Next Visit Plan  how was KT tape? continue with patella mobilization; quad sets, SAQ, SLR if able, supine march, clam shell; soft tissue mobilization to lumbar spine , review PPT, consider bridge,    PT Home Exercise Plan  quad set, patella mob, tennis ball trigger point release, posterior pelvic tilt    Consulted and Agree with Plan of Care  Patient       Patient will benefit from skilled therapeutic intervention in order to improve the following deficits and impairments:  Abnormal gait, Increased muscle spasms, Decreased endurance, Difficulty walking, Decreased activity tolerance, Decreased mobility, Decreased strength, Increased edema  Visit Diagnosis: Chronic pain of right knee  Chronic bilateral low back pain without sciatica  Chronic pain of left knee  Difficulty in walking, not elsewhere classified  Cramp and spasm  Muscle weakness (generalized)     Problem List Patient Active Problem List   Diagnosis Date Noted  . Pterygium of left eye 03/01/2018  . Chronic left shoulder pain 04/19/2017  . Chronic back pain greater than 3 months duration 04/19/2017  . Dyslipidemia 04/19/2017  .  Hypothyroidism 04/07/2017  . HLD (hyperlipidemia) 02/06/2015  . Dyspareunia 05/20/2014  . Unspecified constipation 08/06/2013  . Onychomycosis 08/06/2013  .  Hemorrhoid 08/06/2013  . GERD (gastroesophageal reflux disease) 08/06/2013  . Muscle ache 08/06/2013  . LOW BACK PAIN, MILD 03/02/2007  . HYPERGLYCEMIA 11/03/2006    Silvestre Mesi 04/24/2019, 5:14 PM  [facility] [address] [place], [place], [address] Phone: [phone]   Fax:  [phone]  Name: Courtney Edwards MRN: [id number] Date of Birth: 05/25/1976

## 2019-04-26 ENCOUNTER — Other Ambulatory Visit: Payer: Self-pay

## 2019-04-26 ENCOUNTER — Ambulatory Visit: Payer: Self-pay | Admitting: Physical Therapy

## 2019-04-26 DIAGNOSIS — G8929 Other chronic pain: Secondary | ICD-10-CM

## 2019-04-26 DIAGNOSIS — M6281 Muscle weakness (generalized): Secondary | ICD-10-CM

## 2019-04-26 DIAGNOSIS — R262 Difficulty in walking, not elsewhere classified: Secondary | ICD-10-CM

## 2019-04-26 DIAGNOSIS — R252 Cramp and spasm: Secondary | ICD-10-CM

## 2019-04-26 DIAGNOSIS — M25562 Pain in left knee: Secondary | ICD-10-CM

## 2019-04-26 NOTE — Therapy (Signed)
[facility] [address] [place], [place], [address] Phone: [phone]   Fax:  [phone]  Physical Therapy Treatment  Patient Details  Name: Courtney Edwards MRN: [id number] Date of Birth: 09/14/1975 Referring Provider (PT): Loreen FreudZelda Flemming MD    Encounter Date: 04/26/2019  PT End of Session - 04/26/19 1516    Visit Number  4    Number of Visits  12    Date for PT Re-Evaluation  05/22/19    Authorization Type  CAFA 1/21    PT Start Time  1450    PT Stop Time  1535   last 7 min on ice   PT Time Calculation (min)  45 min    Activity Tolerance  Patient tolerated treatment well    Behavior During Therapy  [facility] assessed/performed       Past Medical History:  Diagnosis Date  . Hypothyroidism   . Low blood pressure   . Medical history non-contributory   . Mental disorder   . Post partum depression 2002    Past Surgical History:  Procedure Laterality Date  . LAPAROSCOPY N/A 02/09/2017   Procedure: LAPAROSCOPY DIAGNOSTIC;  Surgeon: Allie Bossierove, Myra C, MD;  Location: [facility];  Service: Gynecology;  Laterality: N/A;  . NO PAST SURGERIES      There were no vitals filed for this visit.  Subjective Assessment - 04/26/19 1515    Subjective  Relays the session helped her back pain some, now from a 8/10 pain down to a 6/10 pain in her back and knees, pain hurts if she walks or stands too long but goes away with rest or sometimes massage    Patient is accompained by:  --   Tiffany via status [phone]   How long can you stand comfortably?  < a few minutes before she starts to have pain    How long can you walk comfortably?  limited community distances    Diagnostic tests  X-ray:    Pain Onset  More than a month ago    Pain Onset  More than a month ago                       [facility]/Exercise - 04/26/19 0001      Lumbar Exercises: Stretches   Active Hamstring Stretch  Right;Left;2  reps;30 seconds    Single Knee to Chest Stretch  Right;Left;2 reps;30 seconds    Quad Stretch  Right;Left;2 reps;30 seconds    Other Lumbar Stretch Exercise  sidelying lumbar-thoracic rotation stretch 5 sec X 15 each side      Lumbar Exercises: Aerobic   Recumbent Bike  nu step L5 5 min UE/LE      Lumbar Exercises: Supine   Bridge  20 reps    Straight Leg Raise  10 reps    Straight Leg Raises Limitations  mild increased pain      Cryotherapy   Number Minutes Cryotherapy  7 Minutes    Cryotherapy Location  Knee;Lumbar Spine    Type of Cryotherapy  Ice pack      Manual Therapy   Manual therapy comments  STM to knees, patella mobs, LAD bilat sides             PT Education - 04/26/19 1551    Education Details  Pt concerned with disc pathology but PT explains this is not likely as she does not have N/T or burrning down her  legs and that the pain goes away with massage and is likely more muscular in nature.    Person(s) Educated  Patient    Methods  Explanation    Comprehension  Verbalized understanding       PT Short Term Goals - 04/10/19 1537      PT SHORT TERM GOAL #1   Title  Patient will be indpednent with basic HEP for patella mobilization, quad and core strengthening    Time  3    Period  Weeks    Status  New    Target Date  05/01/19      PT SHORT TERM GOAL #2   Title  Patient will increase gross bilateral lower extremity strength to 4+/5    Time  3    Period  Weeks    Status  New    Target Date  05/01/19      PT SHORT TERM GOAL #3   Title  Patient will increase standing tolerance to 15 minutes without increased back pain    Time  3    Period  Weeks    Status  New    Target Date  05/01/19        PT Long Term Goals - 04/10/19 1542      PT LONG TERM GOAL #1   Title  Patient will go up and down 12 steps without pain    Time  6    Period  Weeks    Status  New      PT LONG TERM GOAL #2   Title  Patient will stand for 45 minutes without pain in order  to perfrom ADL's    Time  6    Period  Weeks    Status  New            Plan - 04/26/19 1552    Clinical Impression Statement  Continued manual therapy, stretching and ice from last visit as she relays less pain today. Pt encouraged to continue with stretching and strengthening, massage and ice as her pain presents more muscular in nature.    Personal Factors and Comorbidities  Comorbidity 1;Comorbidity 2    Comorbidities  history of low back pain and    Examination-Activity Limitations  Locomotion Level;Transfers;Stand;Squat    Examination-Participation Restrictions  Meal Prep    Stability/Clinical Decision Making  Evolving/Moderate complexity    Rehab Potential  Good    PT Frequency  2x / week    PT Duration  8 weeks    PT Treatment/Interventions  ADLs/Self Care Home Management;Cryotherapy;Electrical Stimulation;Iontophoresis 4mg /ml Dexamethasone;Moist Heat;Ultrasound;DME Instruction;Gait training;Stair training;Functional mobility training;Therapeutic activities;Therapeutic exercise;Patient/family education;Neuromuscular re-education;Manual techniques;Passive range of motion    PT Next Visit Plan  continue with patella mobilization, lumbar stretching; quad sets, SAQ, SLR if able, supine march, clam shell; consider soft tissue mobilization to lumbar spine    PT Home Exercise Plan  quad set, patella mob, tennis ball trigger point release, posterior pelvic tilt    Consulted and Agree with Plan of Care  Patient       Patient will benefit from skilled therapeutic intervention in order to improve the following deficits and impairments:  Abnormal gait, Increased muscle spasms, Decreased endurance, Difficulty walking, Decreased activity tolerance, Decreased mobility, Decreased strength, Increased edema  Visit Diagnosis: Chronic pain of right knee  Chronic bilateral low back pain without sciatica  Chronic pain of left knee  Difficulty in walking, not elsewhere classified  Cramp and  spasm  Muscle weakness (generalized)  Problem List Patient Active Problem List   Diagnosis Date Noted  . Pterygium of left eye 03/01/2018  . Chronic left shoulder pain 04/19/2017  . Chronic back pain greater than 3 months duration 04/19/2017  . Dyslipidemia 04/19/2017  . Hypothyroidism 04/07/2017  . HLD (hyperlipidemia) 02/06/2015  . Dyspareunia 05/20/2014  . Unspecified constipation 08/06/2013  . Onychomycosis 08/06/2013  . Hemorrhoid 08/06/2013  . GERD (gastroesophageal reflux disease) 08/06/2013  . Muscle ache 08/06/2013  . LOW BACK PAIN, MILD 03/02/2007  . HYPERGLYCEMIA 11/03/2006    Birdie Riddle 04/26/2019, 3:55 PM  [facility] [address] [place], [place], [address] Phone: [phone]   Fax:  [phone]  Name: Courtney Edwards MRN: [id number] Date of Birth: 1976/01/02

## 2019-04-30 ENCOUNTER — Other Ambulatory Visit: Payer: Self-pay

## 2019-04-30 ENCOUNTER — Ambulatory Visit: Payer: Self-pay | Admitting: Physical Therapy

## 2019-04-30 ENCOUNTER — Encounter: Payer: Self-pay | Admitting: Physical Therapy

## 2019-04-30 DIAGNOSIS — R262 Difficulty in walking, not elsewhere classified: Secondary | ICD-10-CM

## 2019-04-30 DIAGNOSIS — G8929 Other chronic pain: Secondary | ICD-10-CM

## 2019-04-30 DIAGNOSIS — M25562 Pain in left knee: Secondary | ICD-10-CM

## 2019-04-30 DIAGNOSIS — M6281 Muscle weakness (generalized): Secondary | ICD-10-CM

## 2019-04-30 DIAGNOSIS — R252 Cramp and spasm: Secondary | ICD-10-CM

## 2019-04-30 NOTE — Therapy (Signed)
[place], [place], [address] Phone: [phone]   Fax:  [phone]  Physical Therapy Treatment  Patient Details  Name: Courtney Edwards MRN: [id number] Date of Birth: 1975/12/31 Referring Provider (PT): Archie Patten MD    Encounter Date: 04/30/2019  PT End of Session - 04/30/19 1632    Visit Number  5    Number of Visits  12    Date for PT Re-Evaluation  05/22/19    Authorization Type  CAFA 1/21    PT Start Time  1545    PT Stop Time  1635    PT Time Calculation (min)  50 min    Activity Tolerance  Patient tolerated treatment well    Behavior During Therapy  [facility] for tasks assessed/performed       Past Medical History:  Diagnosis Date  . Hypothyroidism   . Low blood pressure   . Medical history non-contributory   . Mental disorder   . Post partum depression 2002    Past Surgical History:  Procedure Laterality Date  . LAPAROSCOPY N/A 02/09/2017   Procedure: LAPAROSCOPY DIAGNOSTIC;  Surgeon: Emily Filbert, MD;  Location: [facility];  Service: Gynecology;  Laterality: N/A;  . NO PAST SURGERIES      There were no vitals filed for this visit.  Subjective Assessment - 04/30/19 1548    Subjective  Pt. reports that her back and her knees are doing a little better but still with pain 6/10 this PM in both regions.    Patient is accompained by:  Interpreter   Benjamine Mola # [phone]   Currently in Pain?  Yes    Pain Score  6     Pain Location  Knee    Pain Orientation  Right;Left    Pain Descriptors / Indicators  Aching    Pain Type  Chronic pain    Pain Onset  More than a month ago    Pain Frequency  Constant    Aggravating Factors   standing, stairs    Pain Relieving Factors  rest    Effect of Pain on Daily Activities  limits standing and walking tolerance    Pain Score  6    Pain Location  Back    Pain Orientation  Right;Left;Lower    Pain Descriptors / Indicators  Aching    Pain  Type  Chronic pain    Pain Onset  More than a month ago    Pain Frequency  Constant    Aggravating Factors   standing and walking    Pain Relieving Factors  rest                       [facility] Adult PT Treatment/Exercise - 04/30/19 0001      Lumbar Exercises: Stretches   Passive Hamstring Stretch  Right;Left;2 reps;30 seconds      Lumbar Exercises: Aerobic   Nustep  L4-5 x 5 min UE/LE      Lumbar Exercises: Supine   Pelvic Tilt  15 reps    Clam  20 reps    Clam Limitations  red band    Bent Knee Raise  15 reps    Bridge  20 reps    Straight Leg Raise  10 reps   bilat.     Knee/Hip Exercises: Standing   Hip Flexion  AROM;Stengthening;Right;Left;1 set;10 reps;Knee straight    Hip Flexion Limitations  red band    Hip  Abduction  Right;Left;1 set;10 reps    Abduction Limitations  red band    Hip Extension  AROM;Stengthening;Right;Left;1 set;10 reps    Extension Limitations  red band    Functional Squat Limitations  partail squat at counter x 10 reps      Knee/Hip Exercises: Supine   Hip Adduction Isometric  Strengthening;Both;15 reps      Modalities   Modalities  Moist Heat      Moist Heat Therapy   Number Minutes Moist Heat  10 Minutes    Moist Heat Location  Lumbar Spine      Manual Therapy   Manual therapy comments  patellar mobs-medial glides and tilt, LAD bilat. hips oscillations grade I-IV, lumbar STM in prone               PT Short Term Goals - 04/10/19 1537      PT SHORT TERM GOAL #1   Title  Patient will be indpednent with basic HEP for patella mobilization, quad and core strengthening    Time  3    Period  Weeks    Status  New    Target Date  05/01/19      PT SHORT TERM GOAL #2   Title  Patient will increase gross bilateral lower extremity strength to 4+/5    Time  3    Period  Weeks    Status  New    Target Date  05/01/19      PT SHORT TERM GOAL #3   Title  Patient will increase standing tolerance to 15 minutes without  increased back pain    Time  3    Period  Weeks    Status  New    Target Date  05/01/19        PT Long Term Goals - 04/10/19 1542      PT LONG TERM GOAL #1   Title  Patient will go up and down 12 steps without pain    Time  6    Period  Weeks    Status  New      PT LONG TERM GOAL #2   Title  Patient will stand for 45 minutes without pain in order to perfrom ADL's    Time  6    Period  Weeks    Status  New            Plan - 04/30/19 1603    Clinical Impression Statement  Attempted progression standing exercises for knees-tolerated SLR well but demos decreased femoral control with "knee dominant squat". Mild increased pain with squat otherwise session well-tolerated. Fair progress with status complicated by chronic pain history and multiple tx. areas.    Personal Factors and Comorbidities  Comorbidity 1;Comorbidity 2    Comorbidities  history of low back pain and knee pain    Examination-Activity Limitations  Locomotion Level;Transfers;Stand;Squat    Stability/Clinical Decision Making  Evolving/Moderate complexity    Clinical Decision Making  Moderate    Rehab Potential  Good    PT Frequency  2x / week    PT Duration  8 weeks    PT Treatment/Interventions  ADLs/Self Care Home Management;Cryotherapy;Electrical Stimulation;Iontophoresis 4mg /ml Dexamethasone;Moist Heat;Ultrasound;DME Instruction;Gait training;Stair training;Functional mobility training;Therapeutic activities;Therapeutic exercise;Patient/family education;Neuromuscular re-education;Manual techniques;Passive range of motion    PT Next Visit Plan  continue with patella mobilization, lumbar stretching; quad sets, SAQ, SLR if able, supine march, clam shell; consider soft tissue mobilization to lumbar spine    PT Home Exercise Plan  quad set, patella mob, tennis ball trigger point release, posterior pelvic tilt       Patient will benefit from skilled therapeutic intervention in order to improve the following  deficits and impairments:  Abnormal gait, Increased muscle spasms, Decreased endurance, Difficulty walking, Decreased activity tolerance, Decreased mobility, Decreased strength, Increased edema  Visit Diagnosis: Chronic pain of right knee  Chronic pain of left knee  Chronic bilateral low back pain without sciatica  Difficulty in walking, not elsewhere classified  Cramp and spasm  Muscle weakness (generalized)     Problem List Patient Active Problem List   Diagnosis Date Noted  . Pterygium of left eye 03/01/2018  . Chronic left shoulder pain 04/19/2017  . Chronic back pain greater than 3 months duration 04/19/2017  . Dyslipidemia 04/19/2017  . Hypothyroidism 04/07/2017  . HLD (hyperlipidemia) 02/06/2015  . Dyspareunia 05/20/2014  . Unspecified constipation 08/06/2013  . Onychomycosis 08/06/2013  . Hemorrhoid 08/06/2013  . GERD (gastroesophageal reflux disease) 08/06/2013  . Muscle ache 08/06/2013  . LOW BACK PAIN, MILD 03/02/2007  . HYPERGLYCEMIA 11/03/2006    Lazarus Gowdahristopher Baer Hinton, PT, DPT 04/30/19 4:34 PM  [facility] [address] [place], [place], [address] Phone: [phone]   Fax:  [phone]  Name: Courtney Edwards MRN: [id number] Date of Birth: 03/11/1976

## 2019-05-01 ENCOUNTER — Ambulatory Visit: Payer: Self-pay | Admitting: Physical Therapy

## 2019-05-01 ENCOUNTER — Encounter: Payer: Self-pay | Admitting: Physical Therapy

## 2019-05-01 DIAGNOSIS — G8929 Other chronic pain: Secondary | ICD-10-CM

## 2019-05-01 DIAGNOSIS — R262 Difficulty in walking, not elsewhere classified: Secondary | ICD-10-CM

## 2019-05-01 NOTE — Therapy (Signed)
[facility] [address] [place], [place], [address] Phone: [phone]   Fax:  [phone]  Physical Therapy Treatment  Patient Details  Name: Courtney Edwards MRN: [id number] Date of Birth: 06/13/1976 Referring Provider (PT): Loreen FreudZelda Flemming MD    Encounter Date: 05/01/2019  PT End of Session - 05/01/19 1031    Visit Number  6    Number of Visits  12    Date for PT Re-Evaluation  05/22/19    Authorization Type  CAFA 1/21    PT Start Time  1015    PT Stop Time  1058    PT Time Calculation (min)  43 min    Activity Tolerance  Patient tolerated treatment well    Behavior During Therapy  [facility]formed       Past Medical History:  Diagnosis Date  . Hypothyroidism   . Low blood pressure   . Medical history non-contributory   . Mental disorder   . Post partum depression 2002    Past Surgical History:  Procedure Laterality Date  . LAPAROSCOPY N/A 02/09/2017   Procedure: LAPAROSCOPY DIAGNOSTIC;  Surgeon: Allie Bossierove, Myra C, MD;  Location: [facility];  Service: Gynecology;  Laterality: N/A;  . NO PAST SURGERIES      There were no vitals filed for this visit.  Subjective Assessment - 05/01/19 1044    Subjective  Patient feels like she is a little bterr. She feels the session yesterday helped her. She still reports a 6/10 pain    Patient is accompained by:  Interpreter   Gillis SantaGabriela Strutus Spanish [phone]   How long can you stand comfortably?  < a few minutes before she starts to have pain    How long can you walk comfortably?  limited community distances    Currently in Pain?  Yes    Pain Score  6     Pain Location  Knee    Pain Orientation  Right;Left    Pain Descriptors / Indicators  Aching    Pain Type  Chronic pain    Pain Onset  More than a month ago    Pain Frequency  Constant    Aggravating Factors   standing and stairs    Pain Relieving Factors  rest    Effect of Pain on Daily Activities   limits walking tolerance    Multiple Pain Sites  Yes    Pain Score  6    Pain Location  Back    Pain Orientation  Right;Left;Lower    Pain Descriptors / Indicators  Aching    Pain Type  Chronic pain    Pain Onset  More than a month ago    Pain Frequency  Constant    Aggravating Factors   standing and walking    Pain Relieving Factors  rest    Effect of Pain on Daily Activities  difficulty perfroming daily tasks                       [facility]Ex[facility] - 05/01/19 0001      Lumbar Exercises: Stretches   Passive Hamstring Stretch  Right;Left;2 reps;30 seconds    Piriformis Stretch  3 reps;20 seconds      Lumbar Exercises: Aerobic   Nustep  L4-5 x 5 min UE/LE      Lumbar Exercises: Supine   Clam  20 reps    Clam Limitations  red band  Bent Knee Raise  15 reps    Bridge  20 reps    Straight Leg Raise  10 reps   bilat.     Knee/Hip Exercises: Supine   Bridges Limitations  x10    Straight Leg Raises Limitations  x10 each leg       Modalities   Modalities  Moist Heat      Moist Heat Therapy   Number Minutes Moist Heat  10 Minutes    Moist Heat Location  Lumbar Spine      Manual Therapy   Manual Therapy  Soft tissue mobilization;Taping    Manual therapy comments  patellar mobs-medial glides and tilt, LAD bilat. hips oscillations grade I-IV, lumbar STM in prone    Soft tissue mobilization  trigger point release to lumbar spine and upper gluteals     McConnell  demonstrated to patient how to do it. Reviewed proper donning and doffing              PT Education - 05/01/19 1049    Education Details  reviewed self trigger point release. She reports she has not been using the tennis ball because it hurts. Patient edsucted on how to grade her pain with tennis ball.    Person(s) Educated  Patient    Methods  Explanation;Demonstration;Verbal cues;Tactile cues    Comprehension  Verbalized understanding;Returned demonstration;Verbal cues  required;Tactile cues required       PT Short Term Goals - 05/01/19 1456      PT SHORT TERM GOAL #1   Title  Patient will be indpednent with basic HEP for patella mobilization, quad and core strengthening    Time  3    Period  Weeks    Status  On-going    Target Date  05/01/19      PT SHORT TERM GOAL #2   Title  Patient will increase gross bilateral lower extremity strength to 4+/5    Time  3    Period  Weeks    Status  On-going    Target Date  05/01/19      PT SHORT TERM GOAL #3   Title  Patient will increase standing tolerance to 15 minutes without increased back pain    Time  3    Period  Weeks    Status  On-going    Target Date  05/01/19        PT Long Term Goals - 04/10/19 1542      PT LONG TERM GOAL #1   Title  Patient will go up and down 12 steps without pain    Time  6    Period  Weeks    Status  New      PT LONG TERM GOAL #2   Title  Patient will stand for 45 minutes without pain in order to perfrom ADL's    Time  6    Period  Weeks    Status  New            Plan - 05/01/19 1054    Clinical Impression Statement  Therapy reviewed self trigger point release for home today. She continues to have spsaming in her lower back and gluteals. She had not been perfroming trigger point release at home. She reports she tried it once then stopped because it hurt. Therapy also reviewed stretching. Therapy gave her exerciswes first visit but she did not recall getting any exercises. Therapy will review and print off exercises next visit. Marland Kitchen  Personal Factors and Comorbidities  Comorbidity 1;Comorbidity 2    Comorbidities  history of low back pain and knee pain    Examination-Activity Limitations  Locomotion Level;Transfers;Stand;Squat    Examination-Participation Restrictions  Meal Prep    Stability/Clinical Decision Making  Evolving/Moderate complexity    Clinical Decision Making  Moderate    Rehab Potential  Good    PT Frequency  2x / week    PT Duration  8  weeks    PT Treatment/Interventions  ADLs/Self Care Home Management;Cryotherapy;Electrical Stimulation;Iontophoresis 4mg /ml Dexamethasone;Moist Heat;Ultrasound;DME Instruction;Gait training;Stair training;Functional mobility training;Therapeutic activities;Therapeutic exercise;Patient/family education;Neuromuscular re-education;Manual techniques;Passive range of motion    PT Next Visit Plan  continue with patella mobilization, lumbar stretching; quad sets, SAQ, SLR if able, supine march, clam shell; consider soft tissue mobilization to lumbar spine    PT Home Exercise Plan  quad set, patella mob, tennis ball trigger point release, posterior pelvic tilt    Consulted and Agree with Plan of Care  Patient       Patient will benefit from skilled therapeutic intervention in order to improve the following deficits and impairments:  Abnormal gait, Increased muscle spasms, Decreased endurance, Difficulty walking, Decreased activity tolerance, Decreased mobility, Decreased strength, Increased edema  Visit Diagnosis: Chronic pain of right knee  Chronic pain of left knee  Chronic bilateral low back pain without sciatica  Difficulty in walking, not elsewhere classified     Problem List Patient Active Problem List   Diagnosis Date Noted  . Pterygium of left eye 03/01/2018  . Chronic left shoulder pain 04/19/2017  . Chronic back pain greater than 3 months duration 04/19/2017  . Dyslipidemia 04/19/2017  . Hypothyroidism 04/07/2017  . HLD (hyperlipidemia) 02/06/2015  . Dyspareunia 05/20/2014  . Unspecified constipation 08/06/2013  . Onychomycosis 08/06/2013  . Hemorrhoid 08/06/2013  . GERD (gastroesophageal reflux disease) 08/06/2013  . Muscle ache 08/06/2013  . LOW BACK PAIN, MILD 03/02/2007  . HYPERGLYCEMIA 11/03/2006    Carney Living PT DPT  05/01/2019, 2:59 PM  [facility] [address] [place], [place], [address] Phone: [phone]    Fax:  [phone]  Name: Courtney Edwards MRN: [id number] Date of Birth: 09-12-1975

## 2019-05-07 ENCOUNTER — Ambulatory Visit (HOSPITAL_BASED_OUTPATIENT_CLINIC_OR_DEPARTMENT_OTHER): Payer: Self-pay | Admitting: Pharmacist

## 2019-05-07 ENCOUNTER — Ambulatory Visit: Payer: Self-pay | Attending: Nurse Practitioner | Admitting: Nurse Practitioner

## 2019-05-07 ENCOUNTER — Encounter: Payer: Self-pay | Admitting: Nurse Practitioner

## 2019-05-07 ENCOUNTER — Other Ambulatory Visit: Payer: Self-pay

## 2019-05-07 VITALS — BP 93/65 | HR 63 | Temp 98.8°F | Ht 65.0 in | Wt 188.0 lb

## 2019-05-07 DIAGNOSIS — J302 Other seasonal allergic rhinitis: Secondary | ICD-10-CM

## 2019-05-07 DIAGNOSIS — E785 Hyperlipidemia, unspecified: Secondary | ICD-10-CM

## 2019-05-07 DIAGNOSIS — R945 Abnormal results of liver function studies: Secondary | ICD-10-CM

## 2019-05-07 DIAGNOSIS — R1013 Epigastric pain: Secondary | ICD-10-CM

## 2019-05-07 DIAGNOSIS — Z23 Encounter for immunization: Secondary | ICD-10-CM

## 2019-05-07 DIAGNOSIS — K219 Gastro-esophageal reflux disease without esophagitis: Secondary | ICD-10-CM

## 2019-05-07 DIAGNOSIS — R7989 Other specified abnormal findings of blood chemistry: Secondary | ICD-10-CM

## 2019-05-07 DIAGNOSIS — Z0000 Encounter for general adult medical examination without abnormal findings: Secondary | ICD-10-CM

## 2019-05-07 MED ORDER — AZELASTINE HCL 0.05 % OP SOLN: 1.0000 [drp] | Freq: Every day | OPHTHALMIC | 0 refills | Status: AC

## 2019-05-07 MED ORDER — OMEPRAZOLE 20 MG PO CPDR: 20.0000 mg | DELAYED_RELEASE_CAPSULE | Freq: Two times a day (BID) | ORAL | 0 refills | Status: DC

## 2019-05-07 MED FILL — AZELASTINE HCL 0.05% DROPS: 0.05 | 45 days supply | Qty: 6 | Fill #0

## 2019-05-07 MED FILL — ?OMEPRAZOLE 20MG CAP DR: 20 | 30 days supply | Qty: 60 | Fill #0

## 2019-05-07 NOTE — Patient Instructions (Signed)
Mantenimiento de la salud en las mujeres Health Maintenance, Female Adoptar un estilo de vida saludable y recibir atencin preventiva son importantes para promover la salud y el bienestar. Consulte al mdico sobre:  El esquema adecuado para hacerse pruebas y exmenes peridicos.  Cosas que puede hacer por su cuenta para prevenir enfermedades y mantenerse sana. Qu debo saber sobre la dieta, el peso y el ejercicio? Consuma una dieta saludable   Consuma una dieta que incluya muchas verduras, frutas, productos lcteos con bajo contenido de grasa y protenas magras.  No consuma muchos alimentos ricos en grasas slidas, azcares agregados o sodio. Mantenga un peso saludable El ndice de masa muscular (IMC) se utiliza para identificar problemas de peso. Proporciona una estimacin de la grasa corporal basndose en el peso y la altura. Su mdico puede ayudarle a determinar su IMC y a lograr o mantener un peso saludable. Haga ejercicio con regularidad Haga ejercicio con regularidad. Esta es una de las prcticas ms importantes que puede hacer por su salud. La mayora de los adultos deben seguir estas pautas:  Realizar, al menos, 150minutos de actividad fsica por semana. El ejercicio debe aumentar la frecuencia cardaca y hacerlo transpirar (ejercicio de intensidad moderada).  Hacer ejercicios de fortalecimiento por lo menos dos veces por semana. Agregue esto a su plan de ejercicio de intensidad moderada.  Pasar menos tiempo sentados. Incluso la actividad fsica ligera puede ser beneficiosa. Controle sus niveles de colesterol y lpidos en la sangre Comience a realizarse anlisis de lpidos y colesterol en la sangre a los 20aos y luego reptalos cada 5aos. Hgase controlar los niveles de colesterol con mayor frecuencia si:  Sus niveles de lpidos y colesterol son altos.  Es mayor de 40aos.  Presenta un alto riesgo de padecer enfermedades cardacas. Qu debo saber sobre las pruebas de  deteccin del cncer? Segn su historia clnica y sus antecedentes familiares, es posible que deba realizarse pruebas de deteccin del cncer en diferentes edades. Esto puede incluir pruebas de deteccin de lo siguiente:  Cncer de mama.  Cncer de cuello uterino.  Cncer colorrectal.  Cncer de piel.  Cncer de pulmn. Qu debo saber sobre la enfermedad cardaca, la diabetes y la hipertensin arterial? Presin arterial y enfermedad cardaca  La hipertensin arterial causa enfermedades cardacas y aumenta el riesgo de accidente cerebrovascular. Es ms probable que esto se manifieste en las personas que tienen lecturas de presin arterial alta, tienen ascendencia africana o tienen sobrepeso.  Hgase controlar la presin arterial: ? Cada 3 a 5 aos si tiene entre 18 y 39 aos. ? Todos los aos si es mayor de 40aos. Diabetes Realcese exmenes de deteccin de la diabetes con regularidad. Este anlisis revisa el nivel de azcar en la sangre en ayunas. Hgase las pruebas de deteccin:  Cada tresaos despus de los 40aos de edad si tiene un peso normal y un bajo riesgo de padecer diabetes.  Con ms frecuencia y a partir de una edad inferior si tiene sobrepeso o un alto riesgo de padecer diabetes. Qu debo saber sobre la prevencin de infecciones? Hepatitis B Si tiene un riesgo ms alto de contraer hepatitis B, debe someterse a un examen de deteccin de este virus. Hable con el mdico para averiguar si tiene riesgo de contraer la infeccin por hepatitis B. Hepatitis C Se recomienda el anlisis a:  Todos los que nacieron entre 1945 y 1965.  Todas las personas que tengan un riesgo de haber contrado hepatitis C. Enfermedades de transmisin sexual (ETS)  Hgase las   pruebas de deteccin de ITS, incluidas la gonorrea y la clamidia, si: ? Es sexualmente activa y es menor de 24aos. ? Es mayor de 24aos, y el mdico le informa que corre riesgo de tener este tipo de infecciones. ?  La actividad sexual ha cambiado desde que le hicieron la ltima prueba de deteccin y tiene un riesgo mayor de tener clamidia o gonorrea. Pregntele al mdico si usted tiene riesgo.  Pregntele al mdico si usted tiene un alto riesgo de contraer VIH. El mdico tambin puede recomendarle un medicamento recetado para ayudar a evitar la infeccin por el VIH. Si elige tomar medicamentos para prevenir el VIH, primero debe hacerse los anlisis de deteccin del VIH. Luego debe hacerse anlisis cada 3meses mientras est tomando los medicamentos. Embarazo  Si est por dejar de menstruar (fase premenopusica) y usted puede quedar embarazada, busque asesoramiento antes de quedar embarazada.  Tome de 400 a 800microgramos (mcg) de cido flico todos los das si queda embarazada.  Pida mtodos de control de la natalidad (anticonceptivos) si desea evitar un embarazo no deseado. Osteoporosis y menopausia La osteoporosis es una enfermedad en la que los huesos pierden los minerales y la fuerza por el avance de la edad. El resultado pueden ser fracturas en los huesos. Si tiene 65aos o ms, o si est en riesgo de sufrir osteoporosis y fracturas, pregunte a su mdico si debe:  Hacerse pruebas de deteccin de prdida sea.  Tomar un suplemento de calcio o de vitamina D para reducir el riesgo de fracturas.  Recibir terapia de reemplazo hormonal (TRH) para tratar los sntomas de la menopausia. Siga estas instrucciones en su casa: Estilo de vida  No consuma ningn producto que contenga nicotina o tabaco, como cigarrillos, cigarrillos electrnicos y tabaco de mascar. Si necesita ayuda para dejar de fumar, consulte al mdico.  No consuma drogas.  No comparta agujas.  Solicite ayuda a su mdico si necesita apoyo o informacin para abandonar las drogas. Consumo de alcohol  No beba alcohol si: ? Su mdico le indica no hacerlo. ? Est embarazada, puede estar embarazada o est tratando de quedar embarazada.   Si bebe alcohol: ? Limite la cantidad que consume de 0 a 1 medida por da. ? Limite la ingesta si est amamantando.  Est atento a la cantidad de alcohol que hay en las bebidas que toma. En los [place], una medida equivale a una botella de cerveza de 12oz (355ml), un vaso de vino de 5oz (148ml) o un vaso de una bebida alcohlica de alta graduacin de 1oz (44ml). Instrucciones generales  Realcese los estudios de rutina de la salud, dentales y de la vista.  Mantngase al da con las vacunas.  Infrmele a su mdico si: ? Se siente deprimida con frecuencia. ? Alguna vez ha sido vctima de maltrato o no se siente segura en su casa. Resumen  Adoptar un estilo de vida saludable y recibir atencin preventiva son importantes para promover la salud y el bienestar.  Siga las instrucciones del mdico acerca de una dieta saludable, el ejercicio y la realizacin de pruebas o exmenes para detectar enfermedades.  Siga las instrucciones del mdico con respecto al control del colesterol y la presin arterial. Esta informacin no tiene como fin reemplazar el consejo del mdico. Asegrese de hacerle al mdico cualquier pregunta que tenga. Document Released: 07/22/2011 Document Revised: 08/23/2018 Document Reviewed: 08/23/2018 Elsevier Patient Education  2020 [employer]  

## 2019-05-07 NOTE — Progress Notes (Signed)
Assessment & Plan:  Courtney Edwards was seen today for annual exam.  Diagnoses and all orders for this visit:  Encounter for annual physical exam  Elevated LFTs -     Hepatic Function Panel  Gastroesophageal reflux disease, esophagitis presence not specified -     omeprazole (PRILOSEC) 20 MG capsule; Take 1 capsule (20 mg total) by mouth 2 (two) times daily before a meal. INSTRUCTIONS: Avoid GERD Triggers: acidic, spicy or fried foods, caffeine, coffee, sodas,  alcohol and chocolate.  Will increase to twice per day.  INSTRUCTIONS: Avoid GERD Triggers: acidic, spicy or fried foods, caffeine, coffee, sodas,  alcohol and chocolate.    Dyslipidemia -     Hepatic Function Panel INSTRUCTIONS: Work on a low fat, heart healthy diet and participate in regular aerobic exercise program by working out at least 150 minutes per week; 5 days a week-30 minutes per day. Avoid red meat, fried foods. junk foods, sodas, sugary drinks, unhealthy snacking, alcohol and smoking.  Drink at least 48oz of water per day and monitor your carbohydrate intake daily.    Epigastric pain -     omeprazole (PRILOSEC) 20 MG capsule; Take 1 capsule (20 mg total) by mouth 2 (two) times daily before a meal. INSTRUCTIONS: Avoid GERD Triggers: acidic, spicy or fried foods, caffeine, coffee, sodas,  alcohol and chocolate.   Seasonal allergies -     azelastine (OPTIVAR) 0.05 % ophthalmic solution; Place 1 drop into both eyes daily.    Patient has been counseled on age-appropriate routine health concerns for screening and prevention. These are reviewed and up-to-date. Referrals have been placed accordingly. Immunizations are up-to-date or declined.    Subjective:   Chief Complaint  Patient presents with  . Annual Exam   HPI Courtney Edwards 43 y.o. female presents to office today for physical. She was referred today to the [facility] for mammogram.   Review of Systems  Constitutional: Negative for fever,  malaise/fatigue and weight loss.  HENT: Negative.  Negative for nosebleeds.   Eyes: Negative.  Negative for blurred vision, double vision and photophobia.  Respiratory: Negative.  Negative for cough and shortness of breath.   Cardiovascular: Negative.  Negative for chest pain, palpitations and leg swelling.  Gastrointestinal: Positive for abdominal pain and heartburn. Negative for blood in stool, constipation, diarrhea, melena, nausea and vomiting.  Genitourinary: Negative.   Musculoskeletal: Positive for joint pain (right knee ). Negative for myalgias.  Neurological: Negative.  Negative for dizziness, focal weakness, seizures and headaches.  Endo/Heme/Allergies: Positive for environmental allergies.  Psychiatric/Behavioral: Negative.  Negative for suicidal ideas.    Past Medical History:  Diagnosis Date  . Hypothyroidism   . Low blood pressure   . Medical history non-contributory   . Mental disorder   . Post partum depression 2002    Past Surgical History:  Procedure Laterality Date  . LAPAROSCOPY N/A 02/09/2017   Procedure: LAPAROSCOPY DIAGNOSTIC;  Surgeon: Allie Bossier, MD;  Location: [facility];  Service: Gynecology;  Laterality: N/A;  . NO PAST SURGERIES      Family History  Problem Relation Age of Onset  . Hypertension Mother   . Diabetes Mother   . Heart disease Mother     Social History Reviewed with no changes to be made today.   Outpatient Medications Prior to Visit  Medication Sig Dispense Refill  . atorvastatin (LIPITOR) 40 MG tablet Take 1 tablet (40 mg total) by mouth daily at 6 PM. 30 tablet 6  . levothyroxine (  SYNTHROID) 25 MCG tablet Take 1 tablet (25 mcg total) by mouth daily before breakfast. 30 tablet 5  . omeprazole (PRILOSEC) 20 MG capsule Take 1 capsule (20 mg total) by mouth daily. 90 capsule 2  . omega-3 acid ethyl esters (LOVAZA) 1 g capsule Take 2 capsules (2 g total) by mouth 2 (two) times daily. 120 capsule 3  . Vitamin D, Ergocalciferol, (DRISDOL)  1.25 MG (50000 UT) CAPS capsule Take 1 capsule (50,000 Units total) by mouth every 7 (seven) days. (Patient not taking: Reported on 03/05/2019) 16 capsule 0   No facility-administered medications prior to visit.     No Known Allergies     Objective:    BP 93/65 (BP Location: Left Arm, Patient Position: Sitting, Cuff Size: Normal)   Pulse 63   Temp 98.8 F (37.1 C) (Oral)   Ht 5\' 5"  (1.651 m)   Wt 188 lb (85.3 kg)   LMP 04/08/2019 (LMP Unknown)   SpO2 98%   BMI 31.28 kg/m  Wt Readings from Last 3 Encounters:  05/07/19 188 lb (85.3 kg)  04/18/19 180 lb (81.6 kg)  03/05/19 187 lb (84.8 kg)    Physical Exam Constitutional:      Appearance: She is well-developed.  HENT:     Head: Normocephalic and atraumatic.     Right Ear: External ear normal.     Left Ear: External ear normal.     Nose: Nose normal.     Mouth/Throat:     Pharynx: No oropharyngeal exudate.  Eyes:     General: No scleral icterus.       Right eye: No discharge.     Conjunctiva/sclera: Conjunctivae normal.     Pupils: Pupils are equal, round, and reactive to light.  Neck:     Musculoskeletal: Normal range of motion and neck supple.     Thyroid: No thyromegaly.     Trachea: No tracheal deviation.  Cardiovascular:     Rate and Rhythm: Normal rate and regular rhythm.     Pulses:          Dorsalis pedis pulses are 2+ on the right side and 2+ on the left side.       Posterior tibial pulses are 2+ on the right side and 2+ on the left side.     Heart sounds: Normal heart sounds. No murmur. No friction rub.  Pulmonary:     Effort: Pulmonary effort is normal. No accessory muscle usage or respiratory distress.     Breath sounds: Normal breath sounds. No decreased breath sounds, wheezing, rhonchi or rales.  Chest:     Chest wall: No tenderness.     Breasts: Breasts are symmetrical.        Right: No inverted nipple, mass, nipple discharge, skin change or tenderness.        Left: No inverted nipple, mass, nipple  discharge, skin change or tenderness.  Abdominal:     General: Abdomen is protuberant. Bowel sounds are normal.     Palpations: Abdomen is soft. There is no mass.     Tenderness: There is generalized abdominal tenderness and tenderness in the epigastric area. There is no right CVA tenderness, left CVA tenderness, guarding or rebound.  Musculoskeletal:        General: No deformity or signs of injury.     Right lower leg: No edema.     Left lower leg: No edema.  Feet:     Right foot:     Skin integrity: No  blister or skin breakdown.     Left foot:     Skin integrity: No blister or skin breakdown.  Lymphadenopathy:     Cervical: No cervical adenopathy.  Skin:    General: Skin is warm and dry.     Findings: No erythema.  Neurological:     Mental Status: She is alert and oriented to person, place, and time.     Cranial Nerves: No cranial nerve deficit.     Coordination: Coordination normal.     Deep Tendon Reflexes: Reflexes are normal and symmetric.  Psychiatric:        Speech: Speech normal.        Behavior: Behavior normal.        Thought Content: Thought content normal.        Judgment: Judgment normal.         Patient has been counseled extensively about nutrition and exercise as well as the importance of adherence with medications and regular follow-up. The patient was given clear instructions to go to ER or return to medical center if symptoms don't improve, worsen or new problems develop. The patient verbalized understanding.   Follow-up: Return in about 3 months (around 08/06/2019) for Fasting labs and TSH only.   Gildardo Pounds, FNP-BC [facility], [place]   05/09/2019, 12:03 PM

## 2019-05-07 NOTE — Progress Notes (Signed)
Patient presents for vaccination against influenza per orders of Zelda. Consent given. Counseling provided. No contraindications exists. Vaccine administered without incident.   

## 2019-05-08 ENCOUNTER — Ambulatory Visit: Payer: Self-pay | Admitting: Physical Therapy

## 2019-05-08 DIAGNOSIS — G8929 Other chronic pain: Secondary | ICD-10-CM

## 2019-05-08 DIAGNOSIS — M6281 Muscle weakness (generalized): Secondary | ICD-10-CM

## 2019-05-08 DIAGNOSIS — R252 Cramp and spasm: Secondary | ICD-10-CM

## 2019-05-08 DIAGNOSIS — M25562 Pain in left knee: Secondary | ICD-10-CM

## 2019-05-08 DIAGNOSIS — R262 Difficulty in walking, not elsewhere classified: Secondary | ICD-10-CM

## 2019-05-08 LAB — HEPATIC FUNCTION PANEL
ALT: 149 IU/L — ABNORMAL HIGH (ref 0–32)
AST: 81 IU/L — ABNORMAL HIGH (ref 0–40)
Albumin: 4.5 g/dL (ref 3.8–4.8)
Alkaline Phosphatase: 96 IU/L (ref 39–117)
Bilirubin Total: 0.4 mg/dL (ref 0.0–1.2)
Bilirubin, Direct: 0.12 mg/dL (ref 0.00–0.40)
Total Protein: 7.5 g/dL (ref 6.0–8.5)

## 2019-05-08 NOTE — Therapy (Signed)
[facility] [address] [place], [place], [address] Phone: [phone]   Fax:  [phone]  Physical Therapy Treatment  Patient Details  Name: Courtney Edwards MRN: [id number] Date of Birth: 1975/12/16 Referring Provider (PT): Loreen Freud MD    Encounter Date: 05/08/2019  PT End of Session - 05/08/19 1217    Visit Number  7    Number of Visits  12    Date for PT Re-Evaluation  05/22/19    Authorization Type  CAFA 1/21    PT Start Time  1135    PT Stop Time  1217    PT Time Calculation (min)  42 min    Activity Tolerance  Patient tolerated treatment well    Behavior During Therapy  [facility] for tasks assessed/performed       Past Medical History:  Diagnosis Date  . Hypothyroidism   . Low blood pressure   . Medical history non-contributory   . Mental disorder   . Post partum depression 2002    Past Surgical History:  Procedure Laterality Date  . LAPAROSCOPY N/A 02/09/2017   Procedure: LAPAROSCOPY DIAGNOSTIC;  Surgeon: Allie Bossier, MD;  Location: [facility];  Service: Gynecology;  Laterality: N/A;  . NO PAST SURGERIES      There were no vitals filed for this visit.  Subjective Assessment - 05/08/19 1212    Subjective  Knee pain is a lot better, back pain is some better. (does not rate intensity when asked just says its same pain but better)    Patient is accompained by:  Interpreter   Used translator Byrd Hesselbach [phone]   How long can you stand comfortably?  < a few minutes before she starts to have pain    How long can you walk comfortably?  limited community distances    Diagnostic tests  X-ray:    Pain Onset  More than a month ago    Pain Onset  More than a month ago                       [facility] Adult PT Treatment/Exercise - 05/08/19 0001      Lumbar Exercises: Stretches   Single Knee to Chest Stretch  Right;Left;2 reps;30 seconds    Piriformis Stretch  Right;Left;2 reps;30 seconds       Lumbar Exercises: Aerobic   Nustep  L5 x 5 min UE/LE      Lumbar Exercises: Supine   Clam  20 reps    Clam Limitations  green    Bent Knee Raise  15 reps    Bent Knee Raise Limitations  up/up down/down but lacks coordination    Bridge Limitations  2 sets of 10 brige with clam at top but lacks some coordination    Straight Leg Raise  15 reps      Manual Therapy   Manual therapy comments  LAD bilat. hips oscillations grade I-IV, lumbar STM with trial of biofreeze in prone    Soft tissue mobilization  trigger point release to lumbar spine and upper gluteals                PT Short Term Goals - 05/01/19 1456      PT SHORT TERM GOAL #1   Title  Patient will be indpednent with basic HEP for patella mobilization, quad and core strengthening    Time  3    Period  Weeks    Status  On-going  Target Date  05/01/19      PT SHORT TERM GOAL #2   Title  Patient will increase gross bilateral lower extremity strength to 4+/5    Time  3    Period  Weeks    Status  On-going    Target Date  05/01/19      PT SHORT TERM GOAL #3   Title  Patient will increase standing tolerance to 15 minutes without increased back pain    Time  3    Period  Weeks    Status  On-going    Target Date  05/01/19        PT Long Term Goals - 04/10/19 1542      PT LONG TERM GOAL #1   Title  Patient will go up and down 12 steps without pain    Time  6    Period  Weeks    Status  New      PT LONG TERM GOAL #2   Title  Patient will stand for 45 minutes without pain in order to perfrom ADL's    Time  6    Period  Weeks    Status  New            Plan - 05/08/19 1218    Clinical Impression Statement  Continued manual therapy as she feels like this is helping her pain the most. Progressed core strength some today with good pain tolerance but she lacks some coordination.    Personal Factors and Comorbidities  Comorbidity 1;Comorbidity 2    Comorbidities  history of low back pain and knee pain     Examination-Activity Limitations  Locomotion Level;Transfers;Stand;Squat    Examination-Participation Restrictions  Meal Prep    Stability/Clinical Decision Making  Evolving/Moderate complexity    Rehab Potential  Good    PT Frequency  2x / week    PT Duration  8 weeks    PT Treatment/Interventions  ADLs/Self Care Home Management;Cryotherapy;Electrical Stimulation;Iontophoresis 4mg /ml Dexamethasone;Moist Heat;Ultrasound;DME Instruction;Gait training;Stair training;Functional mobility training;Therapeutic activities;Therapeutic exercise;Patient/family education;Neuromuscular re-education;Manual techniques;Passive range of motion    PT Next Visit Plan  continue with patella mobilization, lumbar stretching; quad sets, SAQ, SLR if able, supine march, clam shell; consider soft tissue mobilization to lumbar spine    PT Home Exercise Plan  quad set, patella mob, tennis ball trigger point release, posterior pelvic tilt    Consulted and Agree with Plan of Care  Patient       Patient will benefit from skilled therapeutic intervention in order to improve the following deficits and impairments:  Abnormal gait, Increased muscle spasms, Decreased endurance, Difficulty walking, Decreased activity tolerance, Decreased mobility, Decreased strength, Increased edema  Visit Diagnosis: Chronic pain of right knee  Chronic pain of left knee  Chronic bilateral low back pain without sciatica  Difficulty in walking, not elsewhere classified  Cramp and spasm  Muscle weakness (generalized)     Problem List Patient Active Problem List   Diagnosis Date Noted  . Pterygium of left eye 03/01/2018  . Chronic left shoulder pain 04/19/2017  . Chronic back pain greater than 3 months duration 04/19/2017  . Dyslipidemia 04/19/2017  . Hypothyroidism 04/07/2017  . HLD (hyperlipidemia) 02/06/2015  . Dyspareunia 05/20/2014  . Unspecified constipation 08/06/2013  . Onychomycosis 08/06/2013  . Hemorrhoid 08/06/2013   . GERD (gastroesophageal reflux disease) 08/06/2013  . Muscle ache 08/06/2013  . LOW BACK PAIN, MILD 03/02/2007  . HYPERGLYCEMIA 11/03/2006    Silvestre Mesi 05/08/2019, 12:19 PM  [facility] [address] [name], [place], [address] Phone: [phone]   Fax:  [phone]  Name: Kylan Veach MRN: [id number] Date of Birth: [birth year]

## 2019-05-09 ENCOUNTER — Encounter: Payer: Self-pay | Admitting: Nurse Practitioner

## 2019-05-10 ENCOUNTER — Ambulatory Visit: Payer: Self-pay | Admitting: Physical Therapy

## 2019-05-10 ENCOUNTER — Other Ambulatory Visit: Payer: Self-pay

## 2019-05-10 DIAGNOSIS — M545 Low back pain, unspecified: Secondary | ICD-10-CM

## 2019-05-10 DIAGNOSIS — M6281 Muscle weakness (generalized): Secondary | ICD-10-CM

## 2019-05-10 DIAGNOSIS — G8929 Other chronic pain: Secondary | ICD-10-CM

## 2019-05-10 DIAGNOSIS — M25562 Pain in left knee: Secondary | ICD-10-CM

## 2019-05-10 DIAGNOSIS — R262 Difficulty in walking, not elsewhere classified: Secondary | ICD-10-CM

## 2019-05-10 NOTE — Therapy (Addendum)
[facility] [place], [place], [address] Phone: [phone]   Fax:  [phone]  Physical Therapy Treatment/Discharge   Patient Details  Name: Courtney Edwards MRN: [id number] Date of Birth: Jul 23, 1976 Referring Provider (PT): Archie Patten MD    Encounter Date: 05/10/2019  PT End of Session - 05/10/19 1443    Visit Number  8    Number of Visits  12    Date for PT Re-Evaluation  05/22/19    Authorization Type  CAFA 1/21    PT Start Time  1400    PT Stop Time  1447    PT Time Calculation (min)  47 min    Activity Tolerance  Patient tolerated treatment well    Behavior During Therapy  [facility] for tasks assessed/performed       Past Medical History:  Diagnosis Date  . Hypothyroidism   . Low blood pressure   . Medical history non-contributory   . Mental disorder   . Post partum depression 2002    Past Surgical History:  Procedure Laterality Date  . LAPAROSCOPY N/A 02/09/2017   Procedure: LAPAROSCOPY DIAGNOSTIC;  Surgeon: Emily Filbert, MD;  Location: [facility];  Service: Gynecology;  Laterality: N/A;  . NO PAST SURGERIES      There were no vitals filed for this visit.  Subjective Assessment - 05/10/19 1425    Subjective  Relays the knee and lumbar pain is much better, but having some pain in thoracic (does not rate instensity just says pain for a long time when asked)    Currently in Pain?  Yes         [facility] PT Assessment - 05/10/19 0001      Assessment   Medical Diagnosis  Low back pain/ Bilateral knee pain     Referring Provider (PT)  Archie Patten MD       Strength   Overall Strength Comments  overall 4+/5 bilat hip strength, 5/5 leg strength, poor core strength                   [facility] Adult PT Treatment/Exercise - 05/10/19 0001      Lumbar Exercises: Stretches   Other Lumbar Stretch Exercise  sidelying lumbar-thoracic rotation stretch 5 sec X 15 each side      Lumbar  Exercises: Aerobic   Nustep  L5 x 5 min UE/LE      Lumbar Exercises: Standing   Row  20 reps    Theraband Level (Row)  Level 2 (Red)    Shoulder Extension  20 reps    Theraband Level (Shoulder Extension)  Level 2 (Red)    Other Standing Lumbar Exercises  Horizontal abd and bilat ER with red X 20 ea      Lumbar Exercises: Supine   Bridge  20 reps      Lumbar Exercises: Sidelying   Other Sidelying Lumbar Exercises  hip abd X 15      Cryotherapy   Number Minutes Cryotherapy  8 Minutes    Cryotherapy Location  Lumbar Spine   and thoracic     Manual Therapy   Manual therapy comments  LAD bilat. hips oscillations grade I-IV, lumbar-thoracic central PA mobs grade 3-4    Soft tissue mobilization  trigger point release to lumbar spine, thoracic, and upper trap               PT Short Term Goals - 05/10/19 1427  PT SHORT TERM GOAL #1   Title  Patient will be indpednent with basic HEP for patella mobilization, quad and core strengthening    Time  3    Period  Weeks    Status  Achieved    Target Date  05/01/19      PT SHORT TERM GOAL #2   Title  Patient will increase gross bilateral lower extremity strength to 4+/5    Time  3    Period  Weeks    Status  Achieved    Target Date  05/01/19      PT SHORT TERM GOAL #3   Title  Patient will increase standing tolerance to 15 minutes without increased back pain    Time  3    Period  Weeks    Status  On-going    Target Date  05/01/19        PT Long Term Goals - 05/10/19 1429      PT LONG TERM GOAL #1   Title  Patient will go up and down 12 steps without pain    Status  On-going      PT LONG TERM GOAL #2   Title  Patient will stand for 45 minutes without pain in order to perfrom ADL's    Status  On-going            Plan - 05/10/19 1445    Clinical Impression Statement  had more thoracic pain reported so focused more on thoracic today with stretching, strengtheing, and manual therapy but continued to work on  lumbar as well. She was given theraband at home with new thoracic exrecises for HEP. She is progressing toward her goals.    Personal Factors and Comorbidities  Comorbidity 1;Comorbidity 2    Comorbidities  history of low back pain and knee pain    Examination-Activity Limitations  Locomotion Level;Transfers;Stand;Squat    Examination-Participation Restrictions  Meal Prep    Stability/Clinical Decision Making  Evolving/Moderate complexity    Rehab Potential  Good    PT Frequency  2x / week    PT Duration  8 weeks    PT Treatment/Interventions  ADLs/Self Care Home Management;Cryotherapy;Electrical Stimulation;Iontophoresis 49m/ml Dexamethasone;Moist Heat;Ultrasound;DME Instruction;Gait training;Stair training;Functional mobility training;Therapeutic activities;Therapeutic exercise;Patient/family education;Neuromuscular re-education;Manual techniques;Passive range of motion    PT Next Visit Plan  continue with patella mobilization, lumbar stretching; quad sets, SAQ, SLR if able, supine march, clam shell; consider soft tissue mobilization to lumbar spine    PT Home Exercise Plan  quad set, patella mob, tennis ball trigger point release, posterior pelvic tilt, rows, ext, h abd, bilat ER with red    Consulted and Agree with Plan of Care  Patient       Patient will benefit from skilled therapeutic intervention in order to improve the following deficits and impairments:  Abnormal gait, Increased muscle spasms, Decreased endurance, Difficulty walking, Decreased activity tolerance, Decreased mobility, Decreased strength, Increased edema  Visit Diagnosis: Chronic pain of right knee  Chronic pain of left knee  Chronic bilateral low back pain without sciatica  Difficulty in walking, not elsewhere classified  Muscle weakness (generalized)  PHYSICAL THERAPY DISCHARGE SUMMARY  Visits from Start of Care: 8  Current functional level related to goals / functional outcomes: Improved pain     Remaining deficits: Mild thoracic pain    Education / Equipment: HEP   Plan: Patient agrees to discharge.  Patient goals were not met. Patient is being discharged due to not returning since the last  visit.  ?????       Problem List Patient Active Problem List   Diagnosis Date Noted  . Pterygium of left eye 03/01/2018  . Chronic left shoulder pain 04/19/2017  . Chronic back pain greater than 3 months duration 04/19/2017  . Dyslipidemia 04/19/2017  . Hypothyroidism 04/07/2017  . HLD (hyperlipidemia) 02/06/2015  . Dyspareunia 05/20/2014  . Unspecified constipation 08/06/2013  . Onychomycosis 08/06/2013  . Hemorrhoid 08/06/2013  . GERD (gastroesophageal reflux disease) 08/06/2013  . Muscle ache 08/06/2013  . LOW BACK PAIN, MILD 03/02/2007  . HYPERGLYCEMIA 11/03/2006    Silvestre Mesi 05/10/2019, 2:49 PM  [facility] [address] [place], [place], [address] Phone: [phone]   Fax:  [phone]  Name: Courtney Edwards MRN: [id number] Date of Birth: [birth year]

## 2019-05-17 ENCOUNTER — Other Ambulatory Visit: Payer: Self-pay | Admitting: Nurse Practitioner

## 2019-05-17 DIAGNOSIS — Z1231 Encounter for screening mammogram for malignant neoplasm of breast: Secondary | ICD-10-CM

## 2019-05-23 ENCOUNTER — Ambulatory Visit: Payer: Self-pay | Admitting: Physical Therapy

## 2019-05-23 ENCOUNTER — Other Ambulatory Visit: Payer: Self-pay

## 2019-05-23 ENCOUNTER — Emergency Department (HOSPITAL_COMMUNITY)
Admission: EM | Admit: 2019-05-23 | Discharge: 2019-05-24 | Disposition: A | Discharge status: Home / Self Care | Payer: HRSA Program | Attending: Emergency Medicine | Admitting: Emergency Medicine

## 2019-05-23 ENCOUNTER — Encounter (HOSPITAL_COMMUNITY): Payer: Self-pay | Admitting: Emergency Medicine

## 2019-05-23 ENCOUNTER — Emergency Department (HOSPITAL_COMMUNITY): Payer: HRSA Program

## 2019-05-23 DIAGNOSIS — U071 COVID-19: Secondary | ICD-10-CM | POA: Insufficient documentation

## 2019-05-23 DIAGNOSIS — B349 Viral infection, unspecified: Secondary | ICD-10-CM | POA: Insufficient documentation

## 2019-05-23 DIAGNOSIS — Z79899 Other long term (current) drug therapy: Secondary | ICD-10-CM | POA: Diagnosis not present

## 2019-05-23 DIAGNOSIS — R0602 Shortness of breath: Secondary | ICD-10-CM | POA: Diagnosis present

## 2019-05-23 DIAGNOSIS — E039 Hypothyroidism, unspecified: Secondary | ICD-10-CM | POA: Insufficient documentation

## 2019-05-23 LAB — CBC WITH DIFFERENTIAL/PLATELET
Abs Immature Granulocytes: 0.01 10*3/uL (ref 0.00–0.07)
Basophils Absolute: 0 10*3/uL (ref 0.0–0.1)
Basophils Relative: 0 %
Eosinophils Absolute: 0 10*3/uL (ref 0.0–0.5)
Eosinophils Relative: 0 %
HCT: 42.8 % (ref 36.0–46.0)
Hemoglobin: 13.9 g/dL (ref 12.0–15.0)
Immature Granulocytes: 0 %
Lymphocytes Relative: 44 %
Lymphs Abs: 2.6 10*3/uL (ref 0.7–4.0)
MCH: 29.3 pg (ref 26.0–34.0)
MCHC: 32.5 g/dL (ref 30.0–36.0)
MCV: 90.1 fL (ref 80.0–100.0)
Monocytes Absolute: 0.6 10*3/uL (ref 0.1–1.0)
Monocytes Relative: 10 %
Neutro Abs: 2.7 10*3/uL (ref 1.7–7.7)
Neutrophils Relative %: 46 %
Platelets: 244 10*3/uL (ref 150–400)
RBC: 4.75 MIL/uL (ref 3.87–5.11)
RDW: 13.2 % (ref 11.5–15.5)
WBC: 5.9 10*3/uL (ref 4.0–10.5)
nRBC: 0 % (ref 0.0–0.2)

## 2019-05-23 LAB — COMPREHENSIVE METABOLIC PANEL
ALT: 104 U/L — ABNORMAL HIGH (ref 0–44)
AST: 76 U/L — ABNORMAL HIGH (ref 15–41)
Albumin: 3.7 g/dL (ref 3.5–5.0)
Alkaline Phosphatase: 75 U/L (ref 38–126)
Anion gap: 8 (ref 5–15)
BUN: 8 mg/dL (ref 6–20)
CO2: 24 mmol/L (ref 22–32)
Calcium: 8.5 mg/dL — ABNORMAL LOW (ref 8.9–10.3)
Chloride: 105 mmol/L (ref 98–111)
Creatinine, Ser: 0.75 mg/dL (ref 0.44–1.00)
GFR calc Af Amer: >60 mL/min (ref >60)
GFR calc non Af Amer: >60 mL/min (ref >60)
Glucose, Bld: 110 mg/dL — ABNORMAL HIGH (ref 70–99)
Potassium: 3.8 mmol/L (ref 3.5–5.1)
Sodium: 137 mmol/L (ref 135–145)
Total Bilirubin: 0.7 mg/dL (ref 0.3–1.2)
Total Protein: 7.6 g/dL (ref 6.5–8.1)

## 2019-05-23 LAB — URINALYSIS, ROUTINE W REFLEX MICROSCOPIC
Bacteria, UA: NONE SEEN
Bilirubin Urine: NEGATIVE
Glucose, UA: NEGATIVE mg/dL
Hgb urine dipstick: NEGATIVE
Ketones, ur: NEGATIVE mg/dL
Nitrite: NEGATIVE
Protein, ur: NEGATIVE mg/dL
Specific Gravity, Urine: 1.02 (ref 1.005–1.030)
pH: 6 (ref 5.0–8.0)

## 2019-05-23 LAB — I-STAT BETA HCG BLOOD, ED (MC, WL, AP ONLY): I-stat hCG, quantitative: <5 m[IU]/mL (ref <5)

## 2019-05-23 MED ORDER — ACETAMINOPHEN 325 MG PO TABS
650.0000 mg | ORAL_TABLET | Freq: Once | ORAL | Status: AC
  Administered 2019-05-23: 22:00:00 650 mg via ORAL
  Filled 2019-05-23: qty 2

## 2019-05-23 NOTE — ED Triage Notes (Signed)
Patient reports SOB with productive cough , fever and chills onset Friday last week , patient added right tonsillar swelling/discharge this week .

## 2019-05-24 LAB — GROUP A STREP BY PCR: Group A Strep by PCR: NOT DETECTED

## 2019-05-24 MED ORDER — PREDNISONE 20 MG PO TABS
60.0000 mg | ORAL_TABLET | Freq: Once | ORAL | Status: DC
  Filled 2019-05-24: qty 3

## 2019-05-24 MED ORDER — BENZONATATE 100 MG PO CAPS: 100.0000 mg | ORAL_CAPSULE | Freq: Three times a day (TID) | ORAL | 0 refills | Status: DC

## 2019-05-24 MED ORDER — ALBUTEROL SULFATE HFA 108 (90 BASE) MCG/ACT IN AERS
2.0000 | INHALATION_SPRAY | Freq: Once | RESPIRATORY_TRACT | Status: AC
  Administered 2019-05-24: 01:00:00 2 via RESPIRATORY_TRACT
  Filled 2019-05-24: qty 6.7

## 2019-05-24 MED FILL — BENZONATATE 100 MG CAPS: 100 | 7 days supply | Qty: 21 | Fill #0

## 2019-05-24 NOTE — ED Provider Notes (Addendum)
[facility] EMERGENCY DEPARTMENT Provider Note   CSN: [id number] Arrival date & time: 05/23/19  2124     History   Chief Complaint Chief Complaint  Patient presents with  . Shortness of Breath    HPI Courtney Edwards Samuel Bouche is a 43 y.o. female presenting for evaluation of shortness of breath, cough, and tonsillar swelling.  Patient states 6 days ago she started develop fevers, chills, and tonsillar swelling.  Over the past 2 days, she has developed worsening shortness of breath and cough.  Her fevers have mostly resolved.  Patient states that when she takes a deep breath in, it causes her to cough a lot.  She has associated nasal congestion.  She denies ear pain, sore throat, chest pain, nausea, vomiting, domino pain, urinary symptoms, normal bowel movements.  She denies wheezing.  She has no medical problems, takes medications daily.  No history of asthma or COPD.  Patient states since she became sick, her grandson has also become sick.  No other sick contacts.  No known contact with COVID-19 positive person. She denies leg pain or swelling. She denies recent travel, surgeries, immobilization, h/o cancer, h/o dvt/pe, or hormone use.      HPI  Past Medical History:  Diagnosis Date  . Hypothyroidism   . Low blood pressure   . Medical history non-contributory   . Mental disorder   . Post partum depression 2002    Patient Active Problem List   Diagnosis Date Noted  . Pterygium of left eye 03/01/2018  . Chronic left shoulder pain 04/19/2017  . Chronic back pain greater than 3 months duration 04/19/2017  . Dyslipidemia 04/19/2017  . Hypothyroidism 04/07/2017  . HLD (hyperlipidemia) 02/06/2015  . Dyspareunia 05/20/2014  . Unspecified constipation 08/06/2013  . Onychomycosis 08/06/2013  . Hemorrhoid 08/06/2013  . GERD (gastroesophageal reflux disease) 08/06/2013  . Muscle ache 08/06/2013  . LOW BACK PAIN, MILD 03/02/2007  . HYPERGLYCEMIA 11/03/2006     Past Surgical History:  Procedure Laterality Date  . LAPAROSCOPY N/A 02/09/2017   Procedure: LAPAROSCOPY DIAGNOSTIC;  Surgeon: Allie Bossier, MD;  Location: [facility];  Service: Gynecology;  Laterality: N/A;  . NO PAST SURGERIES       OB History    Gravida  5   Para  5   Term  5   Preterm  0   AB  0   Living  5     SAB  0   TAB  0   Ectopic      Multiple  0   Live Births  1            Home Medications    Prior to Admission medications   Medication Sig Start Date End Date Taking? Authorizing Provider  atorvastatin (LIPITOR) 40 MG tablet Take 1 tablet (40 mg total) by mouth daily at 6 PM. 03/05/19   Claiborne Rigg, NP  azelastine (OPTIVAR) 0.05 % ophthalmic solution Place 1 drop into both eyes daily. 05/07/19 06/06/19  Claiborne Rigg, NP  benzonatate (TESSALON) 100 MG capsule Take 1 capsule (100 mg total) by mouth every 8 (eight) hours. 05/24/19   Maecie Sevcik, PA-C  levothyroxine (SYNTHROID) 25 MCG tablet Take 1 tablet (25 mcg total) by mouth daily before breakfast. 03/05/19   Claiborne Rigg, NP  omega-3 acid ethyl esters (LOVAZA) 1 g capsule Take 2 capsules (2 g total) by mouth 2 (two) times daily. 03/05/19 04/04/19  Claiborne Rigg, NP  omeprazole (  PRILOSEC) 20 MG capsule Take 1 capsule (20 mg total) by mouth 2 (two) times daily before a meal. 05/07/19 08/05/19  Claiborne RiggFleming, Zelda W, NP  Vitamin D, Ergocalciferol, (DRISDOL) 1.25 MG (50000 UT) CAPS capsule Take 1 capsule (50,000 Units total) by mouth every 7 (seven) days. Patient not taking: Reported on 03/05/2019 09/01/18   Anders SimmondsMcClung, Angela M, PA-C    Family History Family History  Problem Relation Age of Onset  . Hypertension Mother   . Diabetes Mother   . Heart disease Mother     Social History Social History   Tobacco Use  . Smoking status: Never Smoker  . Smokeless tobacco: Never Used  Substance Use Topics  . Alcohol use: No  . Drug use: No     Allergies   Patient has no known allergies.    Review of Systems Review of Systems  Constitutional: Positive for fever.  HENT: Positive for congestion.   Respiratory: Positive for cough and shortness of breath.   All other systems reviewed and are negative.    Physical Exam Updated Vital Signs BP (!) 109/51   Pulse 73   Temp 100.3 F (37.9 C) (Oral)   Resp 18   LMP 05/18/2019   SpO2 96%   Physical Exam Vitals signs and nursing note reviewed.  Constitutional:      General: She is not in acute distress.    Appearance: She is well-developed.     Comments: Appears nontoxic  HENT:     Head: Normocephalic and atraumatic.     Mouth/Throat:     Lips: Pink.     Pharynx: Uvula midline. Posterior oropharyngeal erythema present.     Tonsils: 2+ on the right. 2+ on the left.     Comments: Bilateral tonsillar hypertrophy with erythema.  No obvious exudate.  Uvula midline with equal palate rise.  Handling secretions easily.  No trismus.  No muffled voice. Eyes:     Extraocular Movements: Extraocular movements intact.     Conjunctiva/sclera: Conjunctivae normal.     Pupils: Pupils are equal, round, and reactive to light.  Neck:     Musculoskeletal: Normal range of motion and neck supple.  Cardiovascular:     Rate and Rhythm: Normal rate and regular rhythm.     Pulses: Normal pulses.  Pulmonary:     Effort: Pulmonary effort is normal. No respiratory distress.     Breath sounds: Normal breath sounds. No wheezing.     Comments: Speaking in full sentences.  When patient takes a deep breath in, causes of the cough.  No cough otherwise.  Clear lung sounds in all fields. Abdominal:     General: There is no distension.     Palpations: Abdomen is soft. There is no mass.     Tenderness: There is no abdominal tenderness. There is no guarding or rebound.     Comments: No tenderness palpation the abdomen.  Soft without rigidity, guarding, distention.  Negative rebound.  No signs of peritonitis.  Musculoskeletal: Normal range of motion.      Right lower leg: No edema.     Left lower leg: No edema.  Skin:    General: Skin is warm and dry.     Capillary Refill: Capillary refill takes less than 2 seconds.  Neurological:     Mental Status: She is alert and oriented to person, place, and time.      ED Treatments / Results  Labs (all labs ordered are listed, but only abnormal results are  displayed) Labs Reviewed  URINALYSIS, ROUTINE W REFLEX MICROSCOPIC - Abnormal; Notable for the following components:      Result Value   APPearance HAZY (*)    Leukocytes,Ua TRACE (*)    All other components within normal limits  COMPREHENSIVE METABOLIC PANEL - Abnormal; Notable for the following components:   Glucose, Bld 110 (*)    Calcium 8.5 (*)    AST 76 (*)    ALT 104 (*)    All other components within normal limits  GROUP A STREP BY PCR  NOVEL CORONAVIRUS, NAA (HOSP ORDER, SEND-OUT TO REF LAB; TAT 18-24 HRS)  CBC WITH DIFFERENTIAL/PLATELET  I-STAT BETA HCG BLOOD, ED ([facility], [facility], [facility] ONLY)    EKG EKG Interpretation  Date/Time:  Wednesday May 23 2019 21:40:56 EDT Ventricular Rate:  104 PR Interval:  146 QRS Duration: 76 QT Interval:  368 QTC Calculation: 483 R Axis:   41 Text Interpretation:  Sinus tachycardia Cannot rule out Anterior infarct , age undetermined Abnormal ECG Confirmed by Malvin Johns [phone]) on 05/28/2019 8:45:58 AM   Radiology Dg Chest Portable 1 View  Result Date: 05/23/2019 CLINICAL DATA:  Shortness of breath and productive cough EXAM: PORTABLE CHEST 1 VIEW COMPARISON:  None. FINDINGS: The heart size and mediastinal contours are within normal limits. Both lungs are clear. The visualized skeletal structures are unremarkable. IMPRESSION: No active disease. Electronically Signed   By: Inez Catalina M.D.   On: 05/23/2019 21:59    Procedures Procedures (including critical care time)  Medications Ordered in ED Medications  predniSONE (DELTASONE) tablet 60 mg (has no administration in time range)   acetaminophen (TYLENOL) tablet 650 mg (650 mg Oral Given 05/23/19 2148)  albuterol (VENTOLIN HFA) 108 (90 Base) MCG/ACT inhaler 2 puff (2 puffs Inhalation Given 05/24/19 0056)     Initial Impression / Assessment and Plan / ED Course  I have reviewed the triage vital signs and the nursing notes.  Pertinent labs & imaging results that were available during my care of the patient were reviewed by me and considered in my medical decision making (see chart for details).        Patient presenting for 1 week history of fevers, chills, sore throat, cough, shortness of breath.  Physical exam shows patient appears nontoxic.  She is satting well without signs of respiratory distress.  Patient takes deep breath in, causes her cough.  While I do not hear any wheezing, history and exam consistent with bronchitis that patient started with fever and URI symptoms, and since has had worsened cough and shortness of breath.  However, also consider coronavirus despite no known exposure.  Consider other viral illness.  Chest x-ray viewed interpreted by me, no pneumonia pneumothorax, effusion, cardiomegaly.  Will obtain strep due to fever and sore throat.  Labs obtained from triage reassuring, no leukocytosis.  Slight elevation in liver enzymes, likely due to acute illness. No abd pain or signs of GB pathology.  Will give inhaler and prednisone and reassess.  Strep negative.  On reassessment, patient reports improvement with albuterol.  As such, likely bronchitis.  Discussed likely viral illness and symptomatic treatment at home.  Discussed continued use of inhaler as well as Tessalon to decrease cough.  Discussed that results for COVID are pending and importance of quarantine.  Patient to follow-up with primary care if symptoms not improving.  Return precautions given including worsening signs of respiratory status.  At this time, patient appears safe for discharge.  Return precautions given.  Patient  states she  understands and agrees to plan.  Adelene Alethia Berthold [name] was evaluated in Emergency Department on 05/24/2019 for the symptoms described in the history of present illness. She was evaluated in the context of the global COVID-19 pandemic, which necessitated consideration that the patient might be at risk for infection with the SARS-CoV-2 virus that causes COVID-19. Institutional protocols and algorithms that pertain to the evaluation of patients at risk for COVID-19 are in a state of rapid change based on information released by regulatory bodies including the CDC and federal and state organizations. These policies and algorithms were followed during the patient's care in the ED.  Final Clinical Impressions(s) / ED Diagnoses   Final diagnoses:  Viral illness    ED Discharge Orders         Ordered    benzonatate (TESSALON) 100 MG capsule  Every 8 hours     05/24/19 0120           Alveria Apley, PA-C 05/24/19 0434    Nira Conn, MD 05/24/19 0650    Alveria Apley, PA-C 06/04/19 1558    Cardama, Amadeo Garnet, MD 06/13/19 [phone]

## 2019-05-24 NOTE — ED Notes (Signed)
Patient verbalizes understanding of discharge instructions. Opportunity for questioning and answers were provided. Armband removed by staff, pt discharged from ED ambulatory.   

## 2019-05-24 NOTE — Discharge Instructions (Addendum)
Trata los sintomas del virus.  Toma tylenol e ibuprofen por dolor y [place].  [place] el albuterol cada 4 horas por 2 dias. Despues, [place] para dificultad con respiracion.  [place] tesalon por tos.  Los resultos por Covid regresara en algunos dias. Si es positivo, va a recibir Product manager. Si es negative, no va a recibir Product manager. Puede ver los Doctor, general practice.  Queda en casa hasta que tiene los resultos de covid.  Si no esta mejorando, da una cita con el doctor primario para reevaluaction.  Regresa a la sala de emergencia si los sintomas empeoran.   You likely have a viral illness.  This should be treated symptomatically. Use Tylenol or ibuprofen as needed for fevers or body aches. Use the inhaler every 4 hours for the next 2 days.  After that, use as needed for shortness of breath. Use Tessalon to help with cough. Make sure you stay well-hydrated with water. Wash your hands frequently to prevent spread of infection. Follow-up with your primary care doctor next week if your symptoms are not improving. Return to the emergency room if you develop chest pain, difficulty breathing, or any new or worsening symptoms.

## 2019-05-25 ENCOUNTER — Ambulatory Visit: Payer: Self-pay | Admitting: Physical Therapy

## 2019-05-25 LAB — NOVEL CORONAVIRUS, NAA (HOSP ORDER, SEND-OUT TO REF LAB; TAT 18-24 HRS): SARS-CoV-2, NAA: DETECTED — AB

## 2019-05-28 ENCOUNTER — Ambulatory Visit: Payer: Self-pay | Admitting: Physical Therapy

## 2019-05-28 ENCOUNTER — Other Ambulatory Visit: Payer: Self-pay

## 2019-06-11 ENCOUNTER — Other Ambulatory Visit: Payer: Self-pay

## 2019-06-11 DIAGNOSIS — Z20822 Contact with and (suspected) exposure to covid-19: Secondary | ICD-10-CM

## 2019-06-12 LAB — NOVEL CORONAVIRUS, NAA: SARS-CoV-2, NAA: NOT DETECTED

## 2019-07-04 ENCOUNTER — Other Ambulatory Visit: Payer: Self-pay

## 2019-07-04 ENCOUNTER — Ambulatory Visit
Admission: RE | Admit: 2019-07-04 | Discharge: 2019-07-04 | Disposition: A | Discharge status: Home / Self Care | Payer: No Typology Code available for payment source | Source: Ambulatory Visit | Attending: Nurse Practitioner | Admitting: Nurse Practitioner

## 2019-07-04 DIAGNOSIS — Z1231 Encounter for screening mammogram for malignant neoplasm of breast: Secondary | ICD-10-CM

## 2019-07-30 ENCOUNTER — Other Ambulatory Visit: Payer: Self-pay

## 2019-07-30 ENCOUNTER — Ambulatory Visit: Payer: Self-pay | Attending: Nurse Practitioner

## 2019-07-30 DIAGNOSIS — R7989 Other specified abnormal findings of blood chemistry: Secondary | ICD-10-CM

## 2019-07-31 LAB — HEPATIC FUNCTION PANEL
ALT: 78 IU/L — ABNORMAL HIGH (ref 0–32)
AST: 44 IU/L — ABNORMAL HIGH (ref 0–40)
Albumin: 4.2 g/dL (ref 3.8–4.8)
Alkaline Phosphatase: 92 IU/L (ref 39–117)
Bilirubin Total: 0.3 mg/dL (ref 0.0–1.2)
Bilirubin, Direct: 0.1 mg/dL (ref 0.00–0.40)
Total Protein: 7.3 g/dL (ref 6.0–8.5)

## 2019-08-20 ENCOUNTER — Telehealth: Payer: Self-pay | Admitting: Nurse Practitioner

## 2019-08-20 NOTE — Telephone Encounter (Signed)
Pt had questions about her lab results from 07/30/2019. Please follow up when possible

## 2019-08-24 NOTE — Telephone Encounter (Signed)
Spoke to patient and informed on lab results. Pt. Understood.  Spanish interpreter pacific assist with the call.

## 2019-09-18 ENCOUNTER — Ambulatory Visit: Payer: Self-pay | Attending: Nurse Practitioner

## 2019-09-18 ENCOUNTER — Other Ambulatory Visit: Payer: Self-pay

## 2019-10-08 ENCOUNTER — Ambulatory Visit: Payer: Self-pay | Attending: Nurse Practitioner | Admitting: Nurse Practitioner

## 2019-10-08 ENCOUNTER — Other Ambulatory Visit: Payer: Self-pay

## 2019-10-08 ENCOUNTER — Encounter: Payer: Self-pay | Admitting: Nurse Practitioner

## 2019-10-08 VITALS — BP 98/64 | HR 64 | Temp 97.9°F | Ht 65.0 in | Wt 187.6 lb

## 2019-10-08 DIAGNOSIS — R7401 Elevation of levels of liver transaminase levels: Secondary | ICD-10-CM

## 2019-10-08 DIAGNOSIS — R1013 Epigastric pain: Secondary | ICD-10-CM

## 2019-10-08 DIAGNOSIS — E785 Hyperlipidemia, unspecified: Secondary | ICD-10-CM

## 2019-10-08 DIAGNOSIS — E039 Hypothyroidism, unspecified: Secondary | ICD-10-CM

## 2019-10-08 DIAGNOSIS — E559 Vitamin D deficiency, unspecified: Secondary | ICD-10-CM

## 2019-10-08 DIAGNOSIS — K219 Gastro-esophageal reflux disease without esophagitis: Secondary | ICD-10-CM

## 2019-10-08 DIAGNOSIS — Z131 Encounter for screening for diabetes mellitus: Secondary | ICD-10-CM

## 2019-10-08 MED ORDER — OMEPRAZOLE 20 MG PO CPDR: 20.0000 mg | DELAYED_RELEASE_CAPSULE | Freq: Two times a day (BID) | ORAL | 0 refills | Status: DC

## 2019-10-08 MED ORDER — OMEGA-3-ACID ETHYL ESTERS 1 G PO CAPS: 2.0000 g | ORAL_CAPSULE | Freq: Two times a day (BID) | ORAL | 3 refills | Status: DC

## 2019-10-08 MED ORDER — ATORVASTATIN CALCIUM 40 MG PO TABS: 40.0000 mg | ORAL_TABLET | Freq: Every day | ORAL | 6 refills | Status: DC

## 2019-10-08 MED ORDER — LEVOTHYROXINE SODIUM 25 MCG PO TABS: 25.0000 ug | ORAL_TABLET | Freq: Every day | ORAL | 5 refills | Status: DC

## 2019-10-08 MED FILL — ATORVASTATIN CALCIUM 40 MG: 40 | 30 days supply | Qty: 30 | Fill #0

## 2019-10-08 MED FILL — OMEPRAZOLE 20 MG CAP: 20 | 30 days supply | Qty: 60 | Fill #0

## 2019-10-08 MED FILL — LEVOTHYROXINE 25 MCG TABLET: 25 | 30 days supply | Qty: 30 | Fill #0

## 2019-10-08 MED FILL — OMEGA-3 ETHYL ESTERS 1 GM C: 1 | 30 days supply | Qty: 120 | Fill #0

## 2019-10-08 NOTE — Progress Notes (Signed)
Assessment & Plan:  Courtney Edwards was seen today for follow-up.  Diagnoses and all orders for this visit:  Hypothyroidism, unspecified type -     TSH -     levothyroxine (SYNTHROID) 25 MCG tablet; Take 1 tablet (25 mcg total) by mouth daily before breakfast.  Dyslipidemia -     Lipid panel -     atorvastatin (LIPITOR) 40 MG tablet; Take 1 tablet (40 mg total) by mouth daily at 6 PM. -     omega-3 acid ethyl esters (LOVAZA) 1 g capsule; Take 2 capsules (2 g total) by mouth 2 (two) times daily. INSTRUCTIONS: Work on a low fat, heart healthy diet and participate in regular aerobic exercise program by working out at least 150 minutes per week; 5 days a week-30 minutes per day. Avoid red meat/beef/steak,  fried foods. junk foods, sodas, sugary drinks, unhealthy snacking, alcohol and smoking.  Drink at least 80 oz of water per day and monitor your carbohydrate intake daily.    Transaminitis -     CMP14+EGFR Fatty liver disease Discussed diet and exercise; lose 10-15 lbs  Encounter for screening for diabetes mellitus -     Hemoglobin A1c  Gastroesophageal reflux disease without esophagitis -     omeprazole (PRILOSEC) 20 MG capsule; Take 1 capsule (20 mg total) by mouth 2 (two) times daily before a meal. INSTRUCTIONS: Avoid GERD Triggers: acidic, spicy or fried foods, caffeine, coffee, sodas,  alcohol and chocolate.     Patient has been counseled on age-appropriate routine health concerns for screening and prevention. These are reviewed and up-to-date. Referrals have been placed accordingly. Immunizations are up-to-date or declined.    Subjective:   Chief Complaint  Patient presents with  . Follow-up    Pt. is here for a follow up.    HPI Courtney Edwards 44 y.o. female presents to office today for follow up.  has a past medical history of Hypothyroidism, Low blood pressure, Medical history non-contributory, Mental disorder, and Post partum depression (2002).   VRI was  used to communicate directly with patient for the entire encounter including providing detailed patient instructions.    Hypothyroidism Courtney Edwards is a 44 y.o. female who presents for follow up of hypothyroidism. She denies: change in energy level, diarrhea, heat / cold intolerance, nervousness, palpitations and weight changes  Lab Results  Component Value Date   TSH 4.160 03/05/2019  Unfortunately she stopped taking her Synthroid a few months ago after she was called and instructed to only stop her atorvastatin and omega-3 due to transaminitis.  I have instructed her today to resume her Synthroid at 25 mg and that this is not a medication that we would instruct her to stop   . Review of Systems  Constitutional: Negative for fever, malaise/fatigue and weight loss.  HENT: Negative.  Negative for nosebleeds.   Eyes: Negative.  Negative for blurred vision, double vision and photophobia.  Respiratory: Negative.  Negative for cough and shortness of breath.   Cardiovascular: Negative.  Negative for chest pain, palpitations and leg swelling.  Gastrointestinal: Negative.  Negative for heartburn, nausea and vomiting.  Musculoskeletal: Negative.  Negative for myalgias.  Neurological: Negative.  Negative for dizziness, focal weakness, seizures and headaches.  Psychiatric/Behavioral: Negative.  Negative for suicidal ideas.    Past Medical History:  Diagnosis Date  . Hypothyroidism   . Low blood pressure   . Medical history non-contributory   . Mental disorder   . Post partum depression  2002    Past Surgical History:  Procedure Laterality Date  . LAPAROSCOPY N/A 02/09/2017   Procedure: LAPAROSCOPY DIAGNOSTIC;  Surgeon: Emily Filbert, MD;  Location: [facility];  Service: Gynecology;  Laterality: N/A;  . NO PAST SURGERIES      Family History  Problem Relation Age of Onset  . Hypertension Mother   . Diabetes Mother   . Heart disease Mother     Social History Reviewed with  no changes to be made today.   Outpatient Medications Prior to Visit  Medication Sig Dispense Refill  . benzonatate (TESSALON) 100 MG capsule Take 1 capsule (100 mg total) by mouth every 8 (eight) hours. (Patient not taking: Reported on 10/08/2019) 21 capsule 0  . Vitamin D, Ergocalciferol, (DRISDOL) 1.25 MG (50000 UT) CAPS capsule Take 1 capsule (50,000 Units total) by mouth every 7 (seven) days. (Patient not taking: Reported on 03/05/2019) 16 capsule 0  . atorvastatin (LIPITOR) 40 MG tablet Take 1 tablet (40 mg total) by mouth daily at 6 PM. 30 tablet 6  . levothyroxine (SYNTHROID) 25 MCG tablet Take 1 tablet (25 mcg total) by mouth daily before breakfast. 30 tablet 5  . omega-3 acid ethyl esters (LOVAZA) 1 g capsule Take 2 capsules (2 g total) by mouth 2 (two) times daily. 120 capsule 3  . omeprazole (PRILOSEC) 20 MG capsule Take 1 capsule (20 mg total) by mouth 2 (two) times daily before a meal. 180 capsule 0   No facility-administered medications prior to visit.    No Known Allergies     Objective:    BP 98/64 (BP Location: Right Arm, Patient Position: Sitting, Cuff Size: Normal)   Pulse 64   Temp 97.9 F (36.6 C) (Temporal)   Ht _0  (1.651 m)   Wt 187 lb 9.6 oz (85.1 kg)   LMP 09/21/2019   SpO2 97%   BMI 31.22 kg/m  Wt Readings from Last 3 Encounters:  10/08/19 187 lb 9.6 oz (85.1 kg)  05/07/19 188 lb (85.3 kg)  04/18/19 180 lb (81.6 kg)    Physical Exam Vitals and nursing note reviewed.  Constitutional:      Appearance: She is well-developed.  HENT:     Head: Normocephalic and atraumatic.  Cardiovascular:     Rate and Rhythm: Normal rate and regular rhythm.     Heart sounds: Normal heart sounds. No murmur. No friction rub. No gallop.   Pulmonary:     Effort: Pulmonary effort is normal. No tachypnea or respiratory distress.     Breath sounds: Normal breath sounds. No decreased breath sounds, wheezing, rhonchi or rales.  Chest:     Chest wall: No tenderness.    Abdominal:     General: Bowel sounds are normal.     Palpations: Abdomen is soft.  Musculoskeletal:        General: Normal range of motion.     Cervical back: Normal range of motion.  Skin:    General: Skin is warm and dry.  Neurological:     Mental Status: She is alert and oriented to person, place, and time.     Coordination: Coordination normal.  Psychiatric:        Behavior: Behavior normal. Behavior is cooperative.        Thought Content: Thought content normal.        Judgment: Judgment normal.          Patient has been counseled extensively about nutrition and exercise as well as the importance of  adherence with medications and regular follow-up. The patient was given clear instructions to go to ER or return to medical center if symptoms don't improve, worsen or new problems develop. The patient verbalized understanding.   Follow-up: Return in about 6 months (around 04/06/2020).   Gildardo Pounds, FNP-BC [facility] [place], [place]   10/08/2019, 9:18 AM

## 2019-10-08 NOTE — Patient Instructions (Signed)
Opciones de alimentos para pacientes adultos con enfermedad de reflujo gastroesofgico Food Choices for Gastroesophageal Reflux Disease, Adult Si tiene enfermedad de reflujo gastroesofgico (ERGE), los alimentos que consume y los hbitos de alimentacin son muy importantes. Elegir los alimentos adecuados puede ayudar a aliviar las molestias. Piense en consultar a un especialista en nutricin (nutricionista) para que lo ayude a hacer buenas elecciones. Consejos para seguir este plan  Comidas  Elija alimentos saludables con bajo contenido de grasa, como frutas, verduras, cereales integrales, productos lcteos descremados y carne magra de vaca, de pescado y de ave.  Haga comidas pequeas durante el da en lugar de 3 comidas abundantes. Coma lentamente y en un lugar donde est distendido. Evite agacharse o recostarse hasta 2 o 3horas despus de haber comido.  Evite comer 2 a 3horas antes de ir a acostarse.  Evite beber grandes cantidades de lquidos con las comidas.  Evite frer los alimentos a la hora de la coccin. Puede hornear, grillar o asar a la parrilla.  Evite o limite la cantidad de: ? Chocolate. ? Menta y mentol. ? Alcohol. ? Pimienta. ? Caf negro y descafeinado. ? T negro y descafeinado. ? Bebidas con gas (gaseosas). ? Bebidas energizantes y refrescos que contengan cafena.  Limite los alimentos con alto contenido de grasas, por ejemplo: ? Carnes grasas o alimentos fritos. ? Leche entera, crema, manteca o helado. ? Nueces y mantequillas de frutos secos. ? Pastelera, donas y dulces hechos con manteca o margarina.  Evite los alimentos que le ocasionen sntomas. Estos pueden ser distintos para cada persona. Los alimentos que suelen causan sntomas son los siguientes: ? Tomates. ? Naranjas, limones y limas. ? Pimientos. ? Comidas condimentadas. ? Cebolla y ajo. ? Vinagre. Estilo de vida  Mantenga un peso saludable. Pregntele a su mdico cul es el peso saludable  para usted. Si necesita perder peso, hable con su mdico para hacerlo de manera segura.  Realice actividad fsica durante, al menos, 30 minutos 5 das por semana o ms, o segn lo indicado por su mdico.  Use ropa suelta.  No fume. Si necesita ayuda para dejar de fumar, consulte al mdico.  Duerma con la cabecera de la cama ms elevada que los pies. Use una cua debajo del colchn o bloques debajo del armazn de la cama para mantener la cabecera de la cama elevada. Resumen  Si tiene enfermedad de reflujo gastroesofgico (ERGE), las elecciones de alimentos y el estilo de vida son muy importantes para ayudar a aliviar los sntomas.  Haga comidas pequeas durante el da en lugar de 3 comidas abundantes. Coma lentamente y en un lugar donde est distendido.  Limite los alimentos con alto contenido graso como la carne grasa o los alimentos fritos.  Evite agacharse o recostarse hasta 2 o 3horas despus de haber comido.  Evite la menta y hierba buena, la cafena, el alcohol y el chocolate. Esta informacin no tiene como fin reemplazar el consejo del mdico. Asegrese de hacerle al mdico cualquier pregunta que tenga. Document Revised: 03/08/2017 Document Reviewed: 03/08/2017 Elsevier Patient Education  2020 [employer]  

## 2019-10-09 LAB — CMP14+EGFR
ALT: 142 IU/L — ABNORMAL HIGH (ref 0–32)
AST: 91 IU/L — ABNORMAL HIGH (ref 0–40)
Albumin/Globulin Ratio: 1.2 (ref 1.2–2.2)
Albumin: 4.2 g/dL (ref 3.8–4.8)
Alkaline Phosphatase: 90 IU/L (ref 39–117)
BUN/Creatinine Ratio: 14 (ref 9–23)
BUN: 9 mg/dL (ref 6–24)
Bilirubin Total: 0.3 mg/dL (ref 0.0–1.2)
CO2: 22 mmol/L (ref 20–29)
Calcium: 9.5 mg/dL (ref 8.7–10.2)
Chloride: 105 mmol/L (ref 96–106)
Creatinine, Ser: 0.66 mg/dL (ref 0.57–1.00)
GFR calc Af Amer: 127 mL/min/{1.73_m2} (ref >59)
GFR calc non Af Amer: 110 mL/min/{1.73_m2} (ref >59)
Globulin, Total: 3.5 g/dL (ref 1.5–4.5)
Glucose: 103 mg/dL — ABNORMAL HIGH (ref 65–99)
Potassium: 4.7 mmol/L (ref 3.5–5.2)
Sodium: 142 mmol/L (ref 134–144)
Total Protein: 7.7 g/dL (ref 6.0–8.5)

## 2019-10-09 LAB — LIPID PANEL
Chol/HDL Ratio: 6.2 ratio — ABNORMAL HIGH (ref 0.0–4.4)
Cholesterol, Total: 240 mg/dL — ABNORMAL HIGH (ref 100–199)
HDL: 39 mg/dL — ABNORMAL LOW (ref >39)
LDL Chol Calc (NIH): 146 mg/dL — ABNORMAL HIGH (ref 0–99)
Triglycerides: 297 mg/dL — ABNORMAL HIGH (ref 0–149)
VLDL Cholesterol Cal: 55 mg/dL — ABNORMAL HIGH (ref 5–40)

## 2019-10-09 LAB — HEMOGLOBIN A1C
Est. average glucose Bld gHb Est-mCnc: 128 mg/dL
Hgb A1c MFr Bld: 6.1 % — ABNORMAL HIGH (ref 4.8–5.6)

## 2019-10-09 LAB — TSH: TSH: 6.34 u[IU]/mL — ABNORMAL HIGH (ref 0.450–4.500)

## 2019-10-12 ENCOUNTER — Other Ambulatory Visit: Payer: Self-pay | Admitting: Nurse Practitioner

## 2019-10-12 DIAGNOSIS — R7401 Elevation of levels of liver transaminase levels: Secondary | ICD-10-CM

## 2020-02-11 ENCOUNTER — Encounter: Payer: Self-pay | Admitting: Nurse Practitioner

## 2020-02-11 ENCOUNTER — Ambulatory Visit: Payer: Self-pay | Attending: Nurse Practitioner | Admitting: Nurse Practitioner

## 2020-02-11 ENCOUNTER — Other Ambulatory Visit: Payer: Self-pay

## 2020-02-11 VITALS — BP 101/67 | HR 64 | Temp 97.7°F | Ht 65.0 in | Wt 189.0 lb

## 2020-02-11 DIAGNOSIS — R7401 Elevation of levels of liver transaminase levels: Secondary | ICD-10-CM

## 2020-02-11 DIAGNOSIS — Z124 Encounter for screening for malignant neoplasm of cervix: Secondary | ICD-10-CM

## 2020-02-11 DIAGNOSIS — R7303 Prediabetes: Secondary | ICD-10-CM

## 2020-02-11 LAB — GLUCOSE, POCT (MANUAL RESULT ENTRY): POC Glucose: 97 mg/dl (ref 70–99)

## 2020-02-11 NOTE — Progress Notes (Signed)
Assessment & Plan:  Saiya was seen today for gynecologic exam.  Diagnoses and all orders for this visit:  Encounter for Papanicolaou smear for cervical cancer screening -     Cytology - PAP -     Cervicovaginal ancillary only  Prediabetes -     Glucose (CBG)  Transaminitis -     Hepatitis panel, acute -     Gamma GT    Patient has been counseled on age-appropriate routine health concerns for screening and prevention. These are reviewed and up-to-date. Referrals have been placed accordingly. Immunizations are up-to-date or declined.    Subjective:   Chief Complaint  Patient presents with  . Gynecologic Exam    Pt. is here for pap smear.    HPI Charon Smedberg [name] 44 y.o. female presents to office today for PAP smear. She is overdue for blood work today. LFTs were elevated several months ago.   Review of Systems  Constitutional: Negative.  Negative for chills, fever, malaise/fatigue and weight loss.  Respiratory: Negative.  Negative for cough, shortness of breath and wheezing.   Cardiovascular: Negative.  Negative for chest pain, orthopnea and leg swelling.  Gastrointestinal: Negative for abdominal pain.  Genitourinary: Negative.  Negative for flank pain.  Skin: Negative.  Negative for rash.  Psychiatric/Behavioral: Negative for suicidal ideas.    Past Medical History:  Diagnosis Date  . Hypothyroidism   . Low blood pressure   . Medical history non-contributory   . Mental disorder   . Post partum depression 2002    Past Surgical History:  Procedure Laterality Date  . LAPAROSCOPY N/A 02/09/2017   Procedure: LAPAROSCOPY DIAGNOSTIC;  Surgeon: Allie Bossier, MD;  Location: [facility];  Service: Gynecology;  Laterality: N/A;  . NO PAST SURGERIES      Family History  Problem Relation Age of Onset  . Hypertension Mother   . Diabetes Mother   . Heart disease Mother     Social History Reviewed with no changes to be made today.   Outpatient Medications  Prior to Visit  Medication Sig Dispense Refill  . atorvastatin (LIPITOR) 40 MG tablet Take 1 tablet (40 mg total) by mouth daily at 6 PM. (Patient not taking: Reported on 10/08/2019) 30 tablet 6  . benzonatate (TESSALON) 100 MG capsule Take 1 capsule (100 mg total) by mouth every 8 (eight) hours. (Patient not taking: Reported on 10/08/2019) 21 capsule 0  . levothyroxine (SYNTHROID) 25 MCG tablet Take 1 tablet (25 mcg total) by mouth daily before breakfast. (Patient not taking: Reported on 10/08/2019) 30 tablet 5  . omega-3 acid ethyl esters (LOVAZA) 1 g capsule Take 2 capsules (2 g total) by mouth 2 (two) times daily. (Patient not taking: Reported on 10/08/2019) 120 capsule 3  . omeprazole (PRILOSEC) 20 MG capsule Take 1 capsule (20 mg total) by mouth 2 (two) times daily before a meal. (Patient not taking: Reported on 10/08/2019) 180 capsule 0  . Vitamin D, Ergocalciferol, (DRISDOL) 1.25 MG (50000 UT) CAPS capsule Take 1 capsule (50,000 Units total) by mouth every 7 (seven) days. (Patient not taking: Reported on 03/05/2019) 16 capsule 0   No facility-administered medications prior to visit.    No Known Allergies     Objective:    BP 101/67 (BP Location: Left Arm, Patient Position: Sitting, Cuff Size: Normal)   Pulse 64   Temp 97.7 F (36.5 C) (Temporal)   Ht 5\' 5"  (1.651 m)   Wt 189 lb (85.7 kg)   SpO2  100%   BMI 31.45 kg/m  Wt Readings from Last 3 Encounters:  02/11/20 189 lb (85.7 kg)  10/08/19 187 lb 9.6 oz (85.1 kg)  05/07/19 188 lb (85.3 kg)    Physical Exam Exam conducted with a chaperone present.  Constitutional:      Appearance: She is well-developed.  HENT:     Head: Normocephalic.  Cardiovascular:     Rate and Rhythm: Normal rate and regular rhythm.     Heart sounds: Normal heart sounds.  Pulmonary:     Effort: Pulmonary effort is normal.     Breath sounds: Normal breath sounds.  Abdominal:     General: Bowel sounds are normal.     Palpations: Abdomen is soft.      Hernia: There is no hernia in the left inguinal area.  Genitourinary:    Exam position: Lithotomy position.     Labia:        Right: No rash, tenderness, lesion or injury.        Left: No rash, tenderness, lesion or injury.      Vagina: Normal. No signs of injury and foreign body. No vaginal discharge, erythema, tenderness or bleeding.     Cervix: No cervical motion tenderness or friability.     Uterus: Not deviated and not enlarged.      Adnexa:        Right: No mass, tenderness or fullness.         Left: No mass, tenderness or fullness.       Rectum: Normal. No external hemorrhoid.  Lymphadenopathy:     Lower Body: No right inguinal adenopathy. No left inguinal adenopathy.  Skin:    General: Skin is warm and dry.  Neurological:     Mental Status: She is alert and oriented to person, place, and time.  Psychiatric:        Behavior: Behavior normal.        Thought Content: Thought content normal.        Judgment: Judgment normal.          Patient has been counseled extensively about nutrition and exercise as well as the importance of adherence with medications and regular follow-up. The patient was given clear instructions to go to ER or return to medical center if symptoms don't improve, worsen or new problems develop. The patient verbalized understanding.   Follow-up: No follow-ups on file.   Claiborne Rigg, FNP-BC [facility] [place], [place] [phone]   02/11/2020, 11:37 AM

## 2020-02-12 LAB — CERVICOVAGINAL ANCILLARY ONLY
Bacterial Vaginitis (gardnerella): NEGATIVE
Candida Glabrata: NEGATIVE
Candida Vaginitis: NEGATIVE
Chlamydia: NEGATIVE
Comment: NEGATIVE
Comment: NEGATIVE
Comment: NEGATIVE
Comment: NEGATIVE
Comment: NEGATIVE
Comment: NORMAL
Neisseria Gonorrhea: NEGATIVE
Trichomonas: NEGATIVE

## 2020-02-12 LAB — GAMMA GT: GGT: 34 IU/L (ref 0–60)

## 2020-02-12 LAB — HEPATITIS PANEL, ACUTE
Hep A IgM: NEGATIVE
Hep B C IgM: NEGATIVE
Hep C Virus Ab: <0.1 s/co ratio (ref 0.0–0.9)
Hepatitis B Surface Ag: NEGATIVE

## 2020-02-13 ENCOUNTER — Other Ambulatory Visit: Payer: Self-pay | Admitting: Nurse Practitioner

## 2020-02-13 DIAGNOSIS — R7401 Elevation of levels of liver transaminase levels: Secondary | ICD-10-CM

## 2020-02-13 LAB — CYTOLOGY - PAP
Comment: NEGATIVE
Diagnosis: NEGATIVE
High risk HPV: NEGATIVE

## 2020-02-21 ENCOUNTER — Telehealth: Payer: Self-pay | Admitting: Nurse Practitioner

## 2020-02-21 NOTE — Telephone Encounter (Signed)
Please f/u   Copied from CRM #[id number]. Topic: General - Other >> Feb 19, 2020  4:42 PM Dalphine Handing A wrote: Patient is requesting a callback from nurse to go over lab results

## 2020-02-22 NOTE — Telephone Encounter (Signed)
Spoke to patient and informed on results. Spanish pacific interpreter assist w/ the call.

## 2020-02-27 ENCOUNTER — Other Ambulatory Visit: Payer: Self-pay | Admitting: Nurse Practitioner

## 2020-02-27 DIAGNOSIS — E039 Hypothyroidism, unspecified: Secondary | ICD-10-CM

## 2020-02-28 ENCOUNTER — Ambulatory Visit: Payer: Self-pay | Attending: Nurse Practitioner

## 2020-02-28 ENCOUNTER — Other Ambulatory Visit: Payer: Self-pay

## 2020-02-29 LAB — CMP14+EGFR
ALT: 109 IU/L — ABNORMAL HIGH (ref 0–32)
AST: 71 IU/L — ABNORMAL HIGH (ref 0–40)
Albumin/Globulin Ratio: 1.3 (ref 1.2–2.2)
Albumin: 4.3 g/dL (ref 3.8–4.8)
Alkaline Phosphatase: 79 IU/L (ref 48–121)
BUN/Creatinine Ratio: 10 (ref 9–23)
BUN: 7 mg/dL (ref 6–24)
Bilirubin Total: 0.7 mg/dL (ref 0.0–1.2)
CO2: 21 mmol/L (ref 20–29)
Calcium: 9.4 mg/dL (ref 8.7–10.2)
Chloride: 105 mmol/L (ref 96–106)
Creatinine, Ser: 0.67 mg/dL (ref 0.57–1.00)
GFR calc Af Amer: 126 mL/min/{1.73_m2} (ref >59)
GFR calc non Af Amer: 110 mL/min/{1.73_m2} (ref >59)
Globulin, Total: 3.2 g/dL (ref 1.5–4.5)
Glucose: 99 mg/dL (ref 65–99)
Potassium: 4.4 mmol/L (ref 3.5–5.2)
Sodium: 139 mmol/L (ref 134–144)
Total Protein: 7.5 g/dL (ref 6.0–8.5)

## 2020-02-29 LAB — THYROID PANEL WITH TSH
Free Thyroxine Index: 1.3 (ref 1.2–4.9)
T3 Uptake Ratio: 22 % — ABNORMAL LOW (ref 24–39)
T4, Total: 5.9 ug/dL (ref 4.5–12.0)
TSH: 5.52 u[IU]/mL — ABNORMAL HIGH (ref 0.450–4.500)

## 2020-03-01 ENCOUNTER — Other Ambulatory Visit: Payer: Self-pay | Admitting: Nurse Practitioner

## 2020-03-01 DIAGNOSIS — R1013 Epigastric pain: Secondary | ICD-10-CM

## 2020-03-01 DIAGNOSIS — E785 Hyperlipidemia, unspecified: Secondary | ICD-10-CM

## 2020-03-01 DIAGNOSIS — E039 Hypothyroidism, unspecified: Secondary | ICD-10-CM

## 2020-03-01 MED ORDER — OMEGA-3-ACID ETHYL ESTERS 1 G PO CAPS: 2.0000 g | ORAL_CAPSULE | Freq: Two times a day (BID) | ORAL | 3 refills | Status: DC

## 2020-03-01 MED ORDER — OMEPRAZOLE 20 MG PO CPDR: 20.0000 mg | DELAYED_RELEASE_CAPSULE | Freq: Two times a day (BID) | ORAL | 0 refills | Status: DC

## 2020-03-01 MED ORDER — LEVOTHYROXINE SODIUM 25 MCG PO TABS: 25.0000 ug | ORAL_TABLET | Freq: Every day | ORAL | 5 refills | Status: DC

## 2020-03-03 MED FILL — LEVOTHYROXINE SODIUM 25 MCG: 25 | 30 days supply | Qty: 30 | Fill #0

## 2020-03-03 MED FILL — OMEGA-3 ETHYL ESTERS 1 GM C: 1 | 30 days supply | Qty: 120 | Fill #0

## 2020-03-03 MED FILL — ?OMEPRAZOLE 20 MG CPDR: 20 | 30 days supply | Qty: 60 | Fill #0

## 2020-03-25 MED FILL — ?LEVOTHYROXINE SODIUM 25 MC: 25 | 30 days supply | Qty: 30 | Fill #0

## 2020-04-03 ENCOUNTER — Other Ambulatory Visit: Payer: No Typology Code available for payment source

## 2020-04-03 ENCOUNTER — Other Ambulatory Visit: Payer: Self-pay | Admitting: Nurse Practitioner

## 2020-04-03 DIAGNOSIS — E039 Hypothyroidism, unspecified: Secondary | ICD-10-CM

## 2020-04-07 ENCOUNTER — Ambulatory Visit: Payer: Self-pay

## 2020-04-07 ENCOUNTER — Other Ambulatory Visit: Payer: Self-pay | Admitting: Nurse Practitioner

## 2020-04-07 ENCOUNTER — Ambulatory Visit: Payer: No Typology Code available for payment source

## 2020-04-07 ENCOUNTER — Other Ambulatory Visit: Payer: Self-pay

## 2020-04-07 DIAGNOSIS — E039 Hypothyroidism, unspecified: Secondary | ICD-10-CM

## 2020-04-07 DIAGNOSIS — E7800 Pure hypercholesterolemia, unspecified: Secondary | ICD-10-CM

## 2020-04-08 LAB — TSH: TSH: 5.12 u[IU]/mL — ABNORMAL HIGH (ref 0.450–4.500)

## 2020-04-10 ENCOUNTER — Other Ambulatory Visit: Payer: Self-pay | Admitting: Nurse Practitioner

## 2020-04-10 DIAGNOSIS — E039 Hypothyroidism, unspecified: Secondary | ICD-10-CM

## 2020-04-10 MED ORDER — LEVOTHYROXINE SODIUM 50 MCG PO TABS: 50.0000 ug | ORAL_TABLET | Freq: Every day | ORAL | 1 refills | Status: DC

## 2020-04-11 MED FILL — ?LEVOTHYROXINE 50 MCG TABLE: 50 | 30 days supply | Qty: 30 | Fill #0

## 2020-05-01 ENCOUNTER — Other Ambulatory Visit: Payer: Self-pay | Admitting: Nurse Practitioner

## 2020-05-01 ENCOUNTER — Encounter: Payer: Self-pay | Admitting: Family

## 2020-05-01 ENCOUNTER — Ambulatory Visit: Payer: Self-pay | Attending: Family | Admitting: Family

## 2020-05-01 ENCOUNTER — Other Ambulatory Visit: Payer: Self-pay

## 2020-05-01 VITALS — BP 100/64 | HR 65 | Temp 97.7°F | Ht 65.0 in | Wt 185.4 lb

## 2020-05-01 DIAGNOSIS — Z789 Other specified health status: Secondary | ICD-10-CM

## 2020-05-01 DIAGNOSIS — R42 Dizziness and giddiness: Secondary | ICD-10-CM

## 2020-05-01 DIAGNOSIS — R1013 Epigastric pain: Secondary | ICD-10-CM

## 2020-05-01 DIAGNOSIS — E785 Hyperlipidemia, unspecified: Secondary | ICD-10-CM

## 2020-05-01 MED ORDER — MECLIZINE HCL 12.5 MG PO TABS
12.5000 mg | ORAL_TABLET | Freq: Three times a day (TID) | ORAL | 0 refills | Status: AC | PRN
Start: 2020-05-01 — End: 2020-05-31

## 2020-05-01 MED FILL — MECLIZINE 25 MG TABLET: 25 | 10 days supply | Qty: 15 | Fill #0

## 2020-05-01 MED FILL — OMEGA-3 ETHYL ESTERS 1 GM C: 1 | 30 days supply | Qty: 120 | Fill #0

## 2020-05-01 MED FILL — ?ATORVASTATIN 40MG TABLET: 40 | 30 days supply | Qty: 30 | Fill #0

## 2020-05-01 MED FILL — ?OMEPRAZOLE 20 MG CPDR: 20 | 30 days supply | Qty: 60 | Fill #0

## 2020-05-01 NOTE — Patient Instructions (Addendum)
Meclizine for dizziness. Follow-up with primary provider in 2 weeks or sooner if needed.   Meclizina para mareos. Haga un seguimiento con el proveedor de atencin primaria en 2 semanas o antes si es necesario.    Mareos Dizziness Los mareos son un problema muy frecuente. Causan sensacin de inestabilidad o de desvanecimiento. Puede sentir que se va a desmayar. Los [employer] pueden provocarle una lesin si se tropieza o se cae. La causa puede deberse a [employer], tales como los siguientes:  Medicamentos.  No tener suficiente agua en el cuerpo (deshidratacin).  Enfermedad. Siga estas indicaciones en su casa: Comida y bebida   Beba suficiente lquido para mantener el pis (orina) claro o de color amarillo plido. Esto evita la deshidratacin. Trate de beber ms lquidos transparentes, como agua.  No beba alcohol.  Limite la cantidad de cafena que bebe o come si el mdico se lo indica.  Limite la cantidad de sal (sodio) que bebe o come si el mdico se lo indica. Actividad   Evite los movimientos rpidos. ? Cuando se levante de una silla, sujtese hasta sentirse bien. ? Por la maana, sintese primero a un lado de la cama. Cuando se sienta bien, pngase lentamente de pie mientras se sostiene de algo. Haga esto hasta que se sienta seguro en cuanto al equilibrio.  Mueva las piernas con frecuencia si debe estar de pie en un lugar durante mucho tiempo. Mientras est de pie, contraiga y relaje los msculos de las piernas.  No conduzca vehculos ni opere maquinaria pesada si se siente mareado.  Evite agacharse si se siente mareado. En su casa, coloque los objetos en algn lugar que le resulte fcil alcanzarlos sin agacharse. Estilo de vida  No consuma ningn producto que contenga nicotina o tabaco, como cigarrillos y Administrator, Civil Service. Si necesita ayuda para dejar de fumar, consulte al mdico.  Intente bajar el nivel de estrs. Para hacerlo, puede usar mtodos como el yoga o  la meditacin. Hable con el mdico si necesita ayuda. Instrucciones generales  Controle sus mareos para ver si hay cambios.  Tome los medicamentos de venta libre y los recetados solamente como se lo haya indicado el mdico. Hable con el mdico si cree que la causa de sus mareos es algn medicamento que est tomando.  Infrmele a un amigo o a un familiar si se siente mareado. Pdale a esta persona que llame al mdico si observa cambios en su comportamiento.  Concurra a todas las visitas de control como se lo haya indicado el mdico. Esto es importante. Comunquese con un mdico si:  Los [employer].  Los [employer] o la sensacin de Production assistant, radio.  Siente malestar estomacal (nuseas).  Tiene problemas para escuchar.  Aparecen nuevos sntomas.  Siente inestabilidad al estar de pie.  Siente que la Biochemist, clinical vueltas. Solicite ayuda de inmediato si:  Vomita o tiene heces acuosas (diarrea), y no puede comer o beber nada.  Tiene dificultad para hacer lo siguiente: ? Hablar. ? Caminar. ? Tragar. ? Usar los brazos, las manos o las piernas.  Se siente constantemente dbil.  No piensa con claridad o tiene dificultad para armar oraciones. Es posible que un amigo o un familiar adviertan que esto ocurre.  Tiene los siguientes sntomas: ? Journalist, newspaper. ? Dolor en el vientre (abdomen). ? Falta de aire. ? Sudoracin.  Cambios en la visin.  Sangrado.  Dolor de cabeza muy intenso.  Dolor o rigidez en el cuello.  Grant Ruts. Estos sntomas pueden indicar una  emergencia. No espere hasta que los sntomas desaparezcan. Solicite atencin mdica de inmediato. Comunquese con el servicio de emergencias de su localidad (911 en los [place]). No conduzca por sus propios medios [employer]. Resumen  Los mareos causan sensacin de inestabilidad o de desvanecimiento. Puede sentir que se va a desmayar.  Beba suficiente lquido para mantener el pis (orina)  claro o de color amarillo plido. No beba alcohol.  Evite los movimientos rpidos si se siente mareado.  Controle sus mareos para ver si hay cambios. Esta informacin no tiene Theme park manager el consejo del mdico. Asegrese de hacerle al mdico cualquier pregunta que tenga. Document Revised: 02/03/2017 Document Reviewed: 02/03/2017 Elsevier Patient Education  2020 [employer].

## 2020-05-01 NOTE — Progress Notes (Signed)
Patient ID: Courtney Edwards, female    DOB: October 23, 1975  MRN: [id number]  CC: Dizziness   Subjective: Courtney Edwards is a 44 y.o. female with history of GERD, hypothyroidism, and hyperlipidemia who presents for dizziness.   1. DIZZINESS: Duration: months, last occurrence March 14, 2020 Description of symptoms: reports she cannot describe the dizziness, reports she did have pressure on  Jaw bone and couldn't open mouth for some time on two occasions, reports pressure in forehead one one occasion  Duration of episode: not sure Dizziness frequency: three times  Provoking factors: outside heat  Aggravating factors:  heat outside  Triggered by rolling over in bed: no Triggered by bending over: no Aggravated by head movement: no Aggravated by exertion, coughing, loud noises: no Recent head injury: no Recent or current viral symptoms: no History of vasovagal episodes: no Nausea: no Vomiting: no Tinnitus: reports she did hear a loud sound in her ear on one occasion   Hearing loss: no Aural fullness: no Headache: no Photophobia/phonophobia: no Unsteady gait: no Postural instability: no Diplopia, dysarthria, dysphagia or weakness: no Related to exertion: yes, espcially when in heat outside  Pallor: no Diaphoresis: no Dyspnea: no Chest pain: no   Patient Active Problem List   Diagnosis Date Noted  . Pterygium of left eye 03/01/2018  . Chronic left shoulder pain 04/19/2017  . Chronic back pain greater than 3 months duration 04/19/2017  . Dyslipidemia 04/19/2017  . Hypothyroidism 04/07/2017  . HLD (hyperlipidemia) 02/06/2015  . Dyspareunia 05/20/2014  . Unspecified constipation 08/06/2013  . Onychomycosis 08/06/2013  . Hemorrhoid 08/06/2013  . GERD (gastroesophageal reflux disease) 08/06/2013  . Muscle ache 08/06/2013  . LOW BACK PAIN, MILD 03/02/2007  . HYPERGLYCEMIA 11/03/2006     Current Outpatient Medications on File Prior to Visit  Medication  Sig Dispense Refill  . atorvastatin (LIPITOR) 40 MG tablet Take 1 tablet (40 mg total) by mouth daily at 6 PM. (Patient not taking: Reported on 10/08/2019) 30 tablet 6  . benzonatate (TESSALON) 100 MG capsule Take 1 capsule (100 mg total) by mouth every 8 (eight) hours. (Patient not taking: Reported on 10/08/2019) 21 capsule 0  . levothyroxine (SYNTHROID) 50 MCG tablet Take 1 tablet (50 mcg total) by mouth daily before breakfast. 30 tablet 1  . omega-3 acid ethyl esters (LOVAZA) 1 g capsule Take 2 capsules (2 g total) by mouth 2 (two) times daily. 120 capsule 3  . omeprazole (PRILOSEC) 20 MG capsule Take 1 capsule (20 mg total) by mouth 2 (two) times daily before a meal. 180 capsule 0  . Vitamin D, Ergocalciferol, (DRISDOL) 1.25 MG (50000 UT) CAPS capsule Take 1 capsule (50,000 Units total) by mouth every 7 (seven) days. (Patient not taking: Reported on 03/05/2019) 16 capsule 0   No current facility-administered medications on file prior to visit.    No Known Allergies  Social History   Socioeconomic History  . Marital status: Married    Spouse name: Not on file  . Number of children: Not on file  . Years of education: Not on file  . Highest education level: Not on file  Occupational History  . Not on file  Tobacco Use  . Smoking status: Never Smoker  . Smokeless tobacco: Never Used  Substance and Sexual Activity  . Alcohol use: No  . Drug use: No  . Sexual activity: Yes    Birth control/protection: None  Other Topics Concern  . Not on file  Social History Narrative  .  Not on file   Social Determinants of Health   Financial Resource Strain:   . Difficulty of Paying Living Expenses: Not on file  Food Insecurity:   . Worried About Programme researcher, broadcasting/film/video in the Last Year: Not on file  . Ran Out of Food in the Last Year: Not on file  Transportation Needs:   . Lack of Transportation (Medical): Not on file  . Lack of Transportation (Non-Medical): Not on file  Physical Activity:   .  Days of Exercise per Week: Not on file  . Minutes of Exercise per Session: Not on file  Stress:   . Feeling of Stress : Not on file  Social Connections:   . Frequency of Communication with Friends and Family: Not on file  . Frequency of Social Gatherings with Friends and Family: Not on file  . Attends Religious Services: Not on file  . Active Member of Clubs or Organizations: Not on file  . Attends Banker Meetings: Not on file  . Marital Status: Not on file  Intimate Partner Violence:   . Fear of Current or Ex-Partner: Not on file  . Emotionally Abused: Not on file  . Physically Abused: Not on file  . Sexually Abused: Not on file    Family History  Problem Relation Age of Onset  . Hypertension Mother   . Diabetes Mother   . Heart disease Mother     Past Surgical History:  Procedure Laterality Date  . LAPAROSCOPY N/A 02/09/2017   Procedure: LAPAROSCOPY DIAGNOSTIC;  Surgeon: Allie Bossier, MD;  Location: [facility];  Service: Gynecology;  Laterality: N/A;  . NO PAST SURGERIES      ROS: Review of Systems Negative except as stated above  PHYSICAL EXAM: Vitals with BMI 05/01/2020 02/11/2020 10/08/2019  Height 5\' 5"  5\' 5"  5\' 5"   Weight 185 lbs 6 oz 189 lbs 187 lbs 10 oz  BMI 30.85 31.45 31.22  Systolic 100 101 98  Diastolic 64 67 64  Pulse 65 64 64   Wt Readings from Last 3 Encounters:  05/01/20 185 lb 6.4 oz (84.1 kg)  02/11/20 189 lb (85.7 kg)  10/08/19 187 lb 9.6 oz (85.1 kg)   Physical Exam General appearance - alert, well appearing, and in no distress and oriented to person, place, and time Mental status - alert, oriented to person, place, and time, normal mood, behavior, speech, dress, motor activity, and thought processes Eyes - pupils equal and reactive, extraocular eye movements intact Ears - bilateral TM's and external ear canals normal Nose - normal and patent, no erythema, discharge or polyps Mouth - mucous membranes moist, pharynx normal without  lesions Neck - supple, no significant adenopathy Lymphatics - no palpable lymphadenopathy, no hepatosplenomegaly Chest - clear to auscultation, no wheezes, rales or rhonchi, symmetric air entry, no tachypnea, retractions or cyanosis Heart - normal rate, regular rhythm, normal S1, S2, no murmurs, rubs, clicks or gallops Neurological - alert, oriented, normal speech, no focal findings or movement disorder noted, cranial nerves II through XII intact, motor and sensory grossly normal bilaterally, normal muscle tone, no tremors, strength 5/5, Romberg sign negative, normal gait and station  ASSESSMENT AND PLAN: 1. Dizziness: - Patient with dizziness from what appears to be related to heat outside/from the sun. - Reports she has been dizzy for a total of three times. Last occurrence of dizziness 03/14/2020. - Encouraged patient to stay hydrated.  - Meclizine for dizziness.  - Patient was given clear instructions to  go to Emergency Department or return to medical center if symptoms don't improve, worsen, or new problems develop.The patient verbalized understanding. - Follow-up with primary provider in 2 weeks or sooner if needed.  - meclizine (ANTIVERT) 12.5 MG tablet; Take 1 tablet (12.5 mg total) by mouth 3 (three) times daily as needed.  Dispense: 30 tablet; Refill: 0  2. Language barrier: - [employer] participated during today's visit.  - Interpreter Name: Jacqlyn Larsen, ID#: [id number]    Patient was given the opportunity to ask questions.  Patient verbalized understanding of the plan and was able to repeat key elements of the plan. Patient was given clear instructions to go to Emergency Department or return to medical center if symptoms don't improve, worsen, or new problems develop.The patient verbalized understanding.   Rema Fendt, NP

## 2020-05-01 NOTE — Telephone Encounter (Signed)
Medication Refill - Medication: atorvastatin (LIPITOR) 40 MG tablet,omeprazole (PRILOSEC) 20 MG capsule,omega-3 acid ethyl esters (LOVAZA) 1 g capsule   Has the patient contacted their pharmacy? yes (Agent: If no, request that the patient contact the pharmacy for the refill.) (Agent: If yes, when and what did the pharmacy advise?)Contact PCP  Preferred Pharmacy (with phone number or street name):  [facility] - [place], [place] - [address] Phone:  [phone]  Fax:  [phone]       Agent: Please be advised that RX refills may take up to 3 business days. We ask that you follow-up with your pharmacy.

## 2020-05-02 MED ORDER — OMEPRAZOLE 20 MG PO CPDR: 20.0000 mg | DELAYED_RELEASE_CAPSULE | Freq: Two times a day (BID) | ORAL | 0 refills | Status: DC

## 2020-05-02 MED ORDER — ATORVASTATIN CALCIUM 40 MG PO TABS: 40.0000 mg | ORAL_TABLET | Freq: Every day | ORAL | 6 refills | Status: DC

## 2020-05-02 MED ORDER — OMEGA-3-ACID ETHYL ESTERS 1 G PO CAPS: 2.0000 g | ORAL_CAPSULE | Freq: Two times a day (BID) | ORAL | 3 refills | Status: DC

## 2020-05-21 ENCOUNTER — Other Ambulatory Visit: Payer: Self-pay | Admitting: Nurse Practitioner

## 2020-05-21 DIAGNOSIS — E039 Hypothyroidism, unspecified: Secondary | ICD-10-CM

## 2020-05-21 DIAGNOSIS — R7303 Prediabetes: Secondary | ICD-10-CM

## 2020-05-21 DIAGNOSIS — E785 Hyperlipidemia, unspecified: Secondary | ICD-10-CM

## 2020-05-21 DIAGNOSIS — R7401 Elevation of levels of liver transaminase levels: Secondary | ICD-10-CM

## 2020-05-21 DIAGNOSIS — R7989 Other specified abnormal findings of blood chemistry: Secondary | ICD-10-CM

## 2020-05-22 ENCOUNTER — Other Ambulatory Visit: Payer: Self-pay

## 2020-05-22 ENCOUNTER — Ambulatory Visit: Payer: Self-pay | Attending: Nurse Practitioner

## 2020-05-22 DIAGNOSIS — E785 Hyperlipidemia, unspecified: Secondary | ICD-10-CM

## 2020-05-22 DIAGNOSIS — E039 Hypothyroidism, unspecified: Secondary | ICD-10-CM

## 2020-05-22 DIAGNOSIS — R7303 Prediabetes: Secondary | ICD-10-CM

## 2020-05-22 DIAGNOSIS — R7401 Elevation of levels of liver transaminase levels: Secondary | ICD-10-CM

## 2020-05-22 MED FILL — OMEGA-3 ETHYL ESTERS 1 GM C: 1 | 30 days supply | Qty: 120 | Fill #0

## 2020-05-22 MED FILL — ?ATORVASTATIN 40MG TABLET: 40 | 30 days supply | Qty: 30 | Fill #0

## 2020-05-22 MED FILL — ?LEVOTHYROXINE 50 MCG TABLE: 50 | 30 days supply | Qty: 30 | Fill #1

## 2020-05-22 MED FILL — OMEPRAZOLE 20 MG CAP: 20 | 30 days supply | Qty: 60 | Fill #0

## 2020-05-23 LAB — LIPID PANEL
Chol/HDL Ratio: 5.6 ratio — ABNORMAL HIGH (ref 0.0–4.4)
Cholesterol, Total: 231 mg/dL — ABNORMAL HIGH (ref 100–199)
HDL: 41 mg/dL (ref >39)
LDL Chol Calc (NIH): 146 mg/dL — ABNORMAL HIGH (ref 0–99)
Triglycerides: 240 mg/dL — ABNORMAL HIGH (ref 0–149)
VLDL Cholesterol Cal: 44 mg/dL — ABNORMAL HIGH (ref 5–40)

## 2020-05-23 LAB — T3, FREE: T3, Free: 3.2 pg/mL (ref 2.0–4.4)

## 2020-05-23 LAB — HEPATIC FUNCTION PANEL
ALT: 102 IU/L — ABNORMAL HIGH (ref 0–32)
AST: 65 IU/L — ABNORMAL HIGH (ref 0–40)
Albumin: 4.5 g/dL (ref 3.8–4.8)
Alkaline Phosphatase: 95 IU/L (ref 44–121)
Bilirubin Total: 0.4 mg/dL (ref 0.0–1.2)
Bilirubin, Direct: 0.12 mg/dL (ref 0.00–0.40)
Total Protein: 7.6 g/dL (ref 6.0–8.5)

## 2020-05-23 LAB — T4, FREE: Free T4: 0.91 ng/dL (ref 0.82–1.77)

## 2020-05-23 LAB — TSH: TSH: 1.65 u[IU]/mL (ref 0.450–4.500)

## 2020-05-26 ENCOUNTER — Other Ambulatory Visit: Payer: Self-pay

## 2020-05-26 ENCOUNTER — Ambulatory Visit: Payer: Self-pay | Attending: Nurse Practitioner | Admitting: Pharmacist

## 2020-05-26 ENCOUNTER — Other Ambulatory Visit: Payer: Self-pay | Admitting: Nurse Practitioner

## 2020-05-26 DIAGNOSIS — R7401 Elevation of levels of liver transaminase levels: Secondary | ICD-10-CM

## 2020-05-26 DIAGNOSIS — E785 Hyperlipidemia, unspecified: Secondary | ICD-10-CM

## 2020-05-26 DIAGNOSIS — K760 Fatty (change of) liver, not elsewhere classified: Secondary | ICD-10-CM

## 2020-05-26 DIAGNOSIS — E039 Hypothyroidism, unspecified: Secondary | ICD-10-CM

## 2020-05-26 DIAGNOSIS — Z23 Encounter for immunization: Secondary | ICD-10-CM

## 2020-05-26 MED ORDER — ATORVASTATIN CALCIUM 40 MG PO TABS: 40.0000 mg | ORAL_TABLET | Freq: Every day | ORAL | 1 refills | Status: DC

## 2020-05-26 MED ORDER — OMEGA-3-ACID ETHYL ESTERS 1 G PO CAPS: 2.0000 g | ORAL_CAPSULE | Freq: Two times a day (BID) | ORAL | 3 refills | Status: DC

## 2020-05-26 MED ORDER — LEVOTHYROXINE SODIUM 50 MCG PO TABS: 50.0000 ug | ORAL_TABLET | Freq: Every day | ORAL | 1 refills | Status: DC

## 2020-05-26 NOTE — Progress Notes (Signed)
Patient presents for vaccination against influenza per orders of Zelda. Consent given. Counseling provided. No contraindications exists. Vaccine administered without incident.  ° °Luke Van Ausdall, PharmD, CPP °Clinical Pharmacist °[facility] °[phone] ° °

## 2020-06-18 ENCOUNTER — Encounter: Payer: Self-pay | Admitting: Physician Assistant

## 2020-07-03 ENCOUNTER — Ambulatory Visit: Payer: No Typology Code available for payment source | Admitting: Physician Assistant

## 2020-07-07 MED FILL — ?ATORVASTATIN 40MG TABLET: 40 | 30 days supply | Qty: 30 | Fill #1

## 2020-07-07 MED FILL — ?LEVOTHYROXINE 50 MCG TABLE: 50 | 30 days supply | Qty: 30 | Fill #0

## 2020-07-24 ENCOUNTER — Encounter: Payer: Self-pay | Admitting: Nurse Practitioner

## 2020-07-24 ENCOUNTER — Other Ambulatory Visit (INDEPENDENT_AMBULATORY_CARE_PROVIDER_SITE_OTHER): Payer: Self-pay

## 2020-07-24 ENCOUNTER — Ambulatory Visit (INDEPENDENT_AMBULATORY_CARE_PROVIDER_SITE_OTHER): Payer: Self-pay | Admitting: Nurse Practitioner

## 2020-07-24 VITALS — BP 106/70 | HR 67 | Ht 65.0 in | Wt 183.0 lb

## 2020-07-24 DIAGNOSIS — R7989 Other specified abnormal findings of blood chemistry: Secondary | ICD-10-CM

## 2020-07-24 DIAGNOSIS — K219 Gastro-esophageal reflux disease without esophagitis: Secondary | ICD-10-CM

## 2020-07-24 LAB — PROTIME-INR
INR: 1.1 ratio — ABNORMAL HIGH (ref 0.8–1.0)
Prothrombin Time: 11.9 s (ref 9.6–13.1)

## 2020-07-24 LAB — IBC PANEL
Iron: 204 ug/dL — ABNORMAL HIGH (ref 42–145)
Saturation Ratios: 40.7 % (ref 20.0–50.0)
Transferrin: 358 mg/dL (ref 212.0–360.0)

## 2020-07-24 LAB — FERRITIN: Ferritin: 29.8 ng/mL (ref 10.0–291.0)

## 2020-07-24 NOTE — Patient Instructions (Addendum)
If you are age 42 or older, your body mass index should be between 23-30. Your Body mass index is 30.45 kg/m. If this is out of the aforementioned range listed, please consider follow up with your Primary Care Provider.  If you are age 48 or younger, your body mass index should be between 19-25. Your Body mass index is 30.45 kg/m. If this is out of the aformentioned range listed, please consider follow up with your Primary Care Provider.   Your provider has requested that you go to the basement level for lab work before leaving today. Press "B" on the elevator. The lab is located at the first door on the left as you exit the elevator.  Continue Omeprazole 20 mg 30 minutes before having lunch.  You have been scheduled to have a follow up with Courtney Cluster, NP on August 21, 2020 at 9:00 am.  Thank you for entrusting me with your care and choosing [facility].  Courtney Cluster, NP   Recuento de carbohidratos para la diabetes mellitus en los adultos Carbohydrate Counting for Diabetes Mellitus, Adult  El recuento de carbohidratos es un mtodo para llevar un registro de la cantidad de carbohidratos que se ingieren. La ingesta natural de carbohidratos aumenta la cantidad de azcar (glucosa) en la sangre. El recuento de la cantidad de carbohidratos que se ingieren sirve para que el nivel de glucosa en sangre permanezca dentro de los lmites [place], lo que ayuda a Pharmacologist la diabetes (diabetes mellitus) bajo control. Es importante saber la cantidad de carbohidratos que se pueden ingerir en cada comida sin correr Surveyor, minerals. Esto es Government social research officer. Un especialista en alimentacin y nutricin (nutricionista certificado) puede ayudarlo a crear un plan de alimentacin y a calcular la cantidad de carbohidratos que debe ingerir en cada comida y colacin. Los siguientes alimentos incluyen carbohidratos:  Granos, como panes y cereales.  Frijoles secos y productos con  soja.  Verduras con almidn, como papas, guisantes y maz.  Nils Pyle y jugos de frutas.  Leche y Dentist.  Dulces y colaciones, como pasteles, galletas, caramelos, papas fritas de bolsa y refrescos. Cmo se calculan los carbohidratos? Hay dos maneras de calcular los carbohidratos de los alimentos. Puede usar cualquiera de [address] o [place] combinacin de [employer]. Leer la etiqueta de "informacin nutricional" de los alimentos envasados La lista de "informacin nutricional" est incluida en las etiquetas de casi todas las bebidas y los alimentos envasados de los [place]. Incluye lo siguiente:  El tamao de la porcin.  Informacin sobre los nutrientes de cada porcin, incluidos los gramos (g) de carbohidratos por porcin. Para usar la "informacin nutricional":  Decida cuntas porciones va a comer.  Multiplique la cantidad de porciones por el nmero de carbohidratos por porcin.  El resultado es la cantidad total de carbohidratos que comer. Conocer los tamaos de las porciones estndar de otros alimentos Cuando coma alimentos que contengan carbohidratos y que no estn envasados o no incluyan la "informacin nutricional" en la etiqueta, debe medir las porciones para poder calcular la cantidad de carbohidratos:  Mida los alimentos que comer con una balanza de alimentos o una taza medidora, si es necesario.  Decida cuntas porciones de Programmer, systems.  Multiplique el nmero de porciones por15. La mayora de los alimentos con alto contenido de carbohidratos contienen unos 15g de carbohidratos por porcin. ? Por ejemplo, si come 8onzas (170g) de fresas, habr comido 2porciones y 30g de carbohidratos (2porciones x 15g=30g).  En el caso  de las comidas que contienen mezclas de ms de un alimento, como las sopas y los guisos, debe calcular los carbohidratos de cada alimento que se incluye. La siguiente lista contiene los tamaos de porciones estndar de los  alimentos ricos en carbohidratos ms comunes. Cada una de estas porciones tiene aproximadamente 15g de carbohidratos:  pan de hamburguesa o muffin ingls.  onza (22ml) de jarabe.   onza (14g) de mermelada.  1rebanada de pan.  1tortilla de seis pulgadas.  3onzas (85g) de arroz o pasta cocidos.  4onzas (113g) de frijoles secos cocidos.  4onzas (113g) de verduras con almidn, como guisantes, maz o papas.  4onzas (113g) de cereal caliente.  4 onzas (113g) de pur de papas o de una papa grande al horno.  4onzas (113g) de frutas en lata o congeladas.  4onzas ( ) de jugo de frutas.  4a 6galletas.  6croquetas de pollo.  6onzas (170g) de cereales secos sin azcar.  6onzas (170g) de yogur descremado sin ningn agregado o de yogur endulzado con edulcorante artificial.  8onzas ( ) de [place].  8 onzas (170g) de frutas frescas o una fruta pequea.  24 onzas (680g) de palomitas de maz. Ejemplo de recuento de carbohidratos Ejemplo de comida  3 onzas (85g) de pechugas de pollo.  6onzas (170g) de arroz integral.  4onzas (113g) de maz.  8onzas ( ) de leche.  8onzas (170g) de fresas con crema batida sin azcar. Clculo de carbohidratos 1. Identifique los alimentos que contienen carbohidratos: ? Arroz. ? Maz. ? Leche. ? Jinny Sanders. 2. Calcule cuntas porciones come de cada alimento: ? 2 porciones de arroz. ? 1 porcin de maz. ? 1 porcin de leche. ? 1 porcin de fresas. 3. Multiplique cada nmero de porciones por 15g: ? 2 porciones de arroz x 15 g = 30 g. ? 1 porcin de maz x 15 g = 15 g. ? 1 porcin de leche x 15 g = 15 g. ? 1 porcin de fresas x 15 g = 15 g. 4. Sume todas las cantidades para conocer el total de gramos de carbohidratos consumidos: ? 30g + 15g + 15g + 15g = 75g de carbohidratos en total. Resumen  El recuento de carbohidratos es un mtodo para llevar un registro de la cantidad de  carbohidratos que se ingieren.  La ingesta natural de carbohidratos aumenta la cantidad de azcar (glucosa) en la sangre.  El recuento de la cantidad de carbohidratos que se ingieren sirve para Futures trader de glucosa en sangre dentro de los lmites [place], lo que ayuda a Pharmacologist la diabetes bajo control.  Un especialista en alimentacin y nutricin (nutricionista certificado) puede ayudarlo a crear un plan de alimentacin y a calcular la cantidad de carbohidratos que debe ingerir en cada comida y colacin. Esta informacin no tiene Theme park manager el consejo del mdico. Asegrese de hacerle al mdico cualquier pregunta que tenga. Document Revised: 05/24/2017 Document Reviewed: 01/14/2016 Elsevier Patient Education  2020 [employer].

## 2020-07-24 NOTE — Progress Notes (Signed)
ASSESSMENT AND PLAN    # 44 yo non English speaking female with chronically elevated liver enzymes,  2-3 x ULN. History of fatty liver on prior US.  --Need to exclude other etiologies of chronic liver disease, will obtain complete serologic workup. Hep B surface Ag, Neg HCV negative. Doesn't consume Etoh.  --Discussed weight loss as part of management of fatty liver disease. Needs low carbohydrate diet, will provide literature in Spanish.  --She has been on Lipitor for about ~ 3 years which could be contributing to elevation of liver enzymes.   --Follow up with me in a few weeks to discuss results and see how she is doing on Omeprazole.   # Epigastric discomfort since starting Synthroid three years ago. Still has frequent epigastric burning with occasional nausea and recently started having intermittent postprandial burning in throat.  Prescribed Omeprazole but wouldn't take out of concern that it would cause more upper abdominal burning like thyroid medication did.  --I told her that the Synthroid is not likely causing her upper abdominal burning but that the timing was most likely coincidental.   --Anti reflux measures discussed. She is limiting caffeine and goes to bed on empty stomach --Recommend she try Omeprazole 20 mg Q am 30 minutes before lunch ( symptoms worse at lunch)/ She has a Rx at home --If symptoms do not improve on PPI then consider EGD.  # Hypothyroidism. TSH mildly elevated between Feb 2021 and Aug 2021 but normal two months ago.       HISTORY OF PRESENT ILLNESS     Primary Gastroenterologist :new- Ileene Patrick, MD  Chief Complaint : elevated liver enzymes, upper abdominal burning, burning in throat.   Courtney Edwards is a 44 y.o. female with PMH / PSH significant for,  but not necessarily limited to: Hypothyroidism, hyperlipidemia, obesity.   **Visit done with assistance of in person Spanish interpreter.    Patient is new to the practice,  referred by PCP for evaluation of elevated LFTs but she has other GI concerns.  Patient told three years ago that she had fatty liver and was given some dietary restrictions to include limiting red mild, milk and tortillas. She has not lost any weight. She does avoid red meat and tries to consume less rice, breads.  She doesn't consume Etoh. No FMH of liver disease. She doesn't take herbs / supplements except for Omega 3.   Patient gives a history of epigastric discomfort since starting Synthroid medication three years ago. Burning not related to eating. She sometimes has associated nausea.  Prescribed Omeprazole but wouldn't take because she though it may make the upper abdominal burning worse. She doesn't take NSAIDS. Recently has developed occasional burning in throat after eating, especially after lunch.    Data Reviewed:  February 2018 abdominal ultrasound for a 3-year history of abdominal pain --Fatty liver.  No gallstones or evidence for acute cholecystitis  July 2018 CT scan abd/pelvis w/ contrast  --No liver lesions, gallbladder normal-appearing, no bowel pathology.   August 2020 abdominal ultrasound for epigastric pain and elevated LFTs --Fatty liver versus hepatocellular disease  September 2020 AST 81, ALT 149  December 2020 AST 44, ALT 78  February 2021 AST 91, ALT 142  June 2021  GGT normal.  hepatitis A, B, C serologies negative  July 2021 AST 71 ALT 109  04/07/2020 TSH 5.16 May 2020 AST 65, ALT 102   Previous Endoscopic Evaluations / Pertinent Studies:  none   Past  Medical History:  Diagnosis Date  . Hypothyroidism   . Low blood pressure   . Medical history non-contributory   . Mental disorder   . Post partum depression 2002     Past Surgical History:  Procedure Laterality Date  . LAPAROSCOPY N/A 02/09/2017   Procedure: LAPAROSCOPY DIAGNOSTIC;  Surgeon: Allie Bossier, MD;  Location: [facility];  Service: Gynecology;  Laterality: N/A;  . NO PAST  SURGERIES     Family History  Problem Relation Age of Onset  . Hypertension Mother   . Diabetes Mother   . Heart disease Mother   . Colon cancer Neg Hx   . Pancreatic cancer Neg Hx   . Esophageal cancer Neg Hx   . Rectal cancer Neg Hx   . Stomach cancer Neg Hx   . Liver cancer Neg Hx    Social History   Tobacco Use  . Smoking status: Never Smoker  . Smokeless tobacco: Never Used  Substance Use Topics  . Alcohol use: No  . Drug use: No   Current Outpatient Medications  Medication Sig Dispense Refill  . atorvastatin (LIPITOR) 40 MG tablet Take 1 tablet (40 mg total) by mouth daily at 6 PM. 90 tablet 1  . Vitamin D, Ergocalciferol, (DRISDOL) 1.25 MG (50000 UT) CAPS capsule Take 1 capsule (50,000 Units total) by mouth every 7 (seven) days. 16 capsule 0  . levothyroxine (SYNTHROID) 50 MCG tablet Take 1 tablet (50 mcg total) by mouth daily before breakfast. 30 tablet 1  . omega-3 acid ethyl esters (LOVAZA) 1 g capsule Take 2 capsules (2 g total) by mouth 2 (two) times daily. 120 capsule 3   No current facility-administered medications for this visit.   No Known Allergies   Review of Systems: All systems reviewed and negative except where noted in HPI.   PHYSICAL EXAM :    Wt Readings from Last 3 Encounters:  07/24/20 183 lb (83 kg)  05/01/20 185 lb 6.4 oz (84.1 kg)  02/11/20 189 lb (85.7 kg)    BP 106/70   Pulse 67   Ht 5\' 5"  (1.651 m)   Wt 183 lb (83 kg)   SpO2 94%   BMI 30.45 kg/m  Constitutional:  Pleasant Hispanic female in no acute distress. Psychiatric: Normal mood and affect. Behavior is normal. EENT: Pupils normal.  Conjunctivae are normal. No scleral icterus. Neck supple.  Cardiovascular: Normal rate, regular rhythm. No edema Pulmonary/chest: Effort normal and breath sounds normal. No wheezing, rales or rhonchi. Abdominal: Soft, nondistended, nontender. Bowel sounds active throughout. There are no masses palpable. No hepatomegaly. Neurological: Alert and  oriented to person place and time. Skin: Skin is warm and dry. No rashes noted.  , NP  07/24/2020, 9:45 AM  Cc:  Referring Provider 14/04/2020, NP

## 2020-07-24 NOTE — Progress Notes (Signed)
Agree with assessment and plan as outlined.  

## 2020-07-25 LAB — IGA: Immunoglobulin A: 238 mg/dL (ref 47–310)

## 2020-07-27 LAB — MITOCHONDRIAL ANTIBODIES: Mitochondrial M2 Ab, IgG: <=20 U

## 2020-07-27 LAB — ANA: Anti Nuclear Antibody (ANA): NEGATIVE

## 2020-07-27 LAB — ALPHA-1-ANTITRYPSIN: A-1 Antitrypsin, Ser: 144 mg/dL (ref 83–199)

## 2020-07-27 LAB — ANTI-SMOOTH MUSCLE ANTIBODY, IGG: Actin (Smooth Muscle) Antibody (IGG): <20 U (ref <20)

## 2020-07-27 LAB — HEPATITIS B SURFACE ANTIBODY,QUALITATIVE: Hep B S Ab: REACTIVE — AB

## 2020-07-27 LAB — TISSUE TRANSGLUTAMINASE, IGA: (tTG) Ab, IgA: <1 U/mL

## 2020-07-27 LAB — CERULOPLASMIN: Ceruloplasmin: 29 mg/dL (ref 18–53)

## 2020-08-21 ENCOUNTER — Encounter: Payer: Self-pay | Admitting: Nurse Practitioner

## 2020-08-21 ENCOUNTER — Ambulatory Visit (INDEPENDENT_AMBULATORY_CARE_PROVIDER_SITE_OTHER): Payer: Self-pay | Admitting: Nurse Practitioner

## 2020-08-21 VITALS — BP 110/60 | HR 56 | Ht 65.0 in | Wt 183.2 lb

## 2020-08-21 DIAGNOSIS — K760 Fatty (change of) liver, not elsewhere classified: Secondary | ICD-10-CM

## 2020-08-21 DIAGNOSIS — R1013 Epigastric pain: Secondary | ICD-10-CM

## 2020-08-21 DIAGNOSIS — R7989 Other specified abnormal findings of blood chemistry: Secondary | ICD-10-CM

## 2020-08-21 NOTE — Progress Notes (Signed)
ASSESSMENT AND PLAN    # 45 yo old Spanish speaking female with chronic epigastric burning, worse on empty stomach.  No meaningful improvement with PPI. No alarm symptoms such as anemia, weight loss or GI bleeding.    --Will proceed with EGD for persistent epigastric burning. The risks and benefits of EGD were discussed and the patient agrees to proceed.   # Probable acid reflux with burning in throat. Symptoms resolved with PPI --Continue PPI  # Chronic elevation of liver enzymes 3-4 x ULN. [place] in 2018 and Aug 2020 suggest fatty liver disease. Normal complete serologic workup for other etiologies which she is very happy about.  Suspect NASH. Statin may also be contributing to enzyme elevation. -- We discussed need for weight loss. She says she doesn't eat much meat but I explained that reducing carbohydrate intake would be most helpful.  --If she loses weight but enzymes don't improve then consider holding statin and / or proceeding with liver biopsy. --She should follow up with me in ~ 3 months.   --Her Hep B surface antibody is positive so doesn't need vaccine   HISTORY OF PRESENT ILLNESS     Primary Gastroenterologist : Ileene Patrick, MD   Chief Complaint : follow up on abnormal liver tests, heartburn,  and upper abdominal pain   Courtney Edwards is a 45 y.o. female, non-English speaker with PMH / PSH significant for,  but not necessarily limited to: Hypothyroidism, hyperlipidemia, and obesity.  This visit is being done with the assistance of an in person Spanish interpreter. Patient established care here 07/24/2020 for evaluation of elevated liver tests and epigastric pain.  She has a history of fatty liver disease by ultrasound.  We discussed weight loss as part of fatty liver disease management and low carbohydrate diet literature in Spanish was provided.  Regarding the epigastric pain patient thought it was related to Synthroid. Symptoms worse around lunch  time.  I recommended omeprazole 20 mg prior to lunch .  She is here for follow-up   Interval History:   She has had only minimal improvement in epigastric pain on Omeprazole.  However she was having burning in her throat and that has resolved . The burning epigastric pain is worse on an empty stomach. She feels the burning right now.  She doesn't takes NSAIDS. She has no knew complaints.    Past Medical History:  Diagnosis Date  . Hypothyroidism   . Low blood pressure   . Medical history non-contributory   . Mental disorder   . Post partum depression 2002    Current Medications, Allergies, Past Surgical History, Family History and Social History were reviewed in [employer] record.   Current Outpatient Medications  Medication Sig Dispense Refill  . atorvastatin (LIPITOR) 40 MG tablet Take 1 tablet (40 mg total) by mouth daily at 6 PM. 90 tablet 1  . levothyroxine (SYNTHROID) 50 MCG tablet Take 1 tablet (50 mcg total) by mouth daily before breakfast. 30 tablet 1  . omega-3 acid ethyl esters (LOVAZA) 1 g capsule Take 2 capsules (2 g total) by mouth 2 (two) times daily. 120 capsule 3  . omeprazole (PRILOSEC) 20 MG capsule Take 1 capsule (20 mg total) by mouth 2 (two) times daily before a meal. (Patient taking differently: Take 20 mg by mouth daily.) 180 capsule 0   No current facility-administered medications for this visit.    Review of Systems: No chest pain. No shortness of breath.  No urinary complaints.   PHYSICAL EXAM :    Wt Readings from Last 3 Encounters:  08/21/20 183 lb 3.2 oz (83.1 kg)  07/24/20 183 lb (83 kg)  05/01/20 185 lb 6.4 oz (84.1 kg)    BP 110/60   Pulse (!) 56   Ht 5\' 5"  (1.651 m)   Wt 183 lb 3.2 oz (83.1 kg)   BMI 30.49 kg/m  Constitutional:  Pleasant female in no acute distress. Psychiatric: Normal mood and affect. Behavior is normal. EENT: Pupils normal.  Conjunctivae are normal. No scleral icterus. Neck supple.   Cardiovascular: Normal rate, regular rhythm. No edema Pulmonary/chest: Effort normal and breath sounds normal. No wheezing, rales or rhonchi. Abdominal: Soft, nondistended, nontender. Bowel sounds active throughout. There are no masses palpable. No hepatomegaly. Neurological: Alert and oriented to person place and time. Skin: Skin is warm and dry. No rashes noted.  , NP  08/21/2020, 9:09 AM

## 2020-08-21 NOTE — Patient Instructions (Signed)
Si tiene 65 aos o ms, su ndice de [employer] corporal debe estar entre 23-30. Su ndice de masa corporal es de 30,49 kg / m. Si est fuera del rango mencionado anteriormente, considere hacer un seguimiento con su Proveedor de Marine scientist.  Si tiene 64 aos o menos, su ndice de [employer] corporal debe estar entre 19-25. Su ndice de masa corporal es de 30,49 kg / m. Si esto est fuera del rango mencionado anteriormente, considere hacer un seguimiento con su Proveedor de Marine scientist.  Se le ha programado una endoscopia. Siga las instrucciones escritas que se le dieron en su visita de hoy. Si [place] inhaladores (aunque solo sea necesario), Armed forces operational officer del procedimiento.  Aumente su omeprazol a dos veces [employer].  Seguimiento pendiente de su endoscopia o segn sea necesario.  Gracias por confiarme su atencin y elegir [facility]  Willette Cluster, NP-C   Recuento de carbohidratos para la diabetes mellitus en los adultos Carbohydrate Counting for Diabetes Mellitus, Adult  El recuento de carbohidratos es un mtodo para llevar un registro de la cantidad de carbohidratos que se ingieren. La ingesta natural de carbohidratos aumenta la cantidad de azcar (glucosa) en la sangre. El recuento de la cantidad de carbohidratos que se ingieren sirve para que el nivel de glucosa en sangre permanezca dentro de los lmites [place], lo que ayuda a Pharmacologist la diabetes (diabetes mellitus) bajo control. Es importante saber la cantidad de carbohidratos que se pueden ingerir en cada comida sin correr Surveyor, minerals. Esto es Government social research officer. Un especialista en alimentacin y nutricin (nutricionista certificado) puede ayudarlo a crear un plan de alimentacin y a calcular la cantidad de carbohidratos que debe ingerir en cada comida y colacin. Los siguientes alimentos incluyen carbohidratos:  Granos, como panes y cereales.  Frijoles secos y productos con soja.  Verduras con almidn, como  papas, guisantes y maz.  Nils Pyle y jugos de frutas.  Leche y Dentist.  Dulces y colaciones, como pasteles, galletas, caramelos, papas fritas de bolsa y refrescos. Cmo se calculan los carbohidratos? Hay dos maneras de calcular los carbohidratos de los alimentos. Puede usar cualquiera de [address] o [place] combinacin de [place]. Leer la etiqueta de "informacin nutricional" de los alimentos envasados La lista de "informacin nutricional" est incluida en las etiquetas de casi todas las bebidas y los alimentos envasados de los [place]. Incluye lo siguiente:  El tamao de la porcin.  Informacin sobre los nutrientes de cada porcin, incluidos los gramos (g) de carbohidratos por porcin. Para usar la "informacin nutricional":  Decida cuntas porciones va a comer.  Multiplique la cantidad de porciones por el nmero de carbohidratos por porcin.  El resultado es la cantidad total de carbohidratos que comer. Conocer los tamaos de las porciones estndar de otros alimentos Cuando coma alimentos que contengan carbohidratos y que no estn envasados o no incluyan la "informacin nutricional" en la etiqueta, debe medir las porciones para poder calcular la cantidad de carbohidratos:  Mida los alimentos que comer con una balanza de alimentos o una taza medidora, si es necesario.  Decida cuntas porciones de Programmer, systems.  Multiplique el nmero de porciones por15. La mayora de los alimentos con alto contenido de carbohidratos contienen unos 15g de carbohidratos por porcin. ? Por ejemplo, si come 8onzas (170g) de fresas, habr comido 2porciones y 30g de carbohidratos (2porciones x 15g=30g).  En el caso de las comidas que contienen mezclas de ms de un alimento, como las sopas y [place]  guisos, debe calcular los carbohidratos de cada alimento que se incluye. La siguiente lista contiene los tamaos de porciones estndar de los alimentos ricos en carbohidratos ms  comunes. Cada una de estas porciones tiene aproximadamente 15g de carbohidratos:  pan de hamburguesa o muffin ingls.  onza (78ml) de jarabe.   onza (14g) de mermelada.  1rebanada de pan.  1tortilla de seis pulgadas.  3onzas (85g) de arroz o pasta cocidos.  4onzas (113g) de frijoles secos cocidos.  4onzas (113g) de verduras con almidn, como guisantes, maz o papas.  4onzas (113g) de cereal caliente.  4 onzas (113g) de pur de papas o de una papa grande al horno.  4onzas (113g) de frutas en lata o congeladas.  4onzas ( ) de jugo de frutas.  4a 6galletas.  6croquetas de pollo.  6onzas (170g) de cereales secos sin azcar.  6onzas (170g) de yogur descremado sin ningn agregado o de yogur endulzado con edulcorante artificial.  8onzas ( ) de [place].  8 onzas (170g) de frutas frescas o una fruta pequea.  24 onzas (680g) de palomitas de maz. Ejemplo de recuento de carbohidratos Ejemplo de comida  3 onzas (85g) de pechugas de pollo.  6onzas (170g) de arroz integral.  4onzas (113g) de maz.  8onzas ( ) de leche.  8onzas (170g) de fresas con crema batida sin azcar. Clculo de carbohidratos 1. Identifique los alimentos que contienen carbohidratos: ? Arroz. ? Maz. ? Leche. ? Jinny Sanders. 2. Calcule cuntas porciones come de cada alimento: ? 2 porciones de arroz. ? 1 porcin de maz. ? 1 porcin de leche. ? 1 porcin de fresas. 3. Multiplique cada nmero de porciones por 15g: ? 2 porciones de arroz x 15 g = 30 g. ? 1 porcin de maz x 15 g = 15 g. ? 1 porcin de leche x 15 g = 15 g. ? 1 porcin de fresas x 15 g = 15 g. 4. Sume todas las cantidades para conocer el total de gramos de carbohidratos consumidos: ? 30g + 15g + 15g + 15g = 75g de carbohidratos en total. Resumen  El recuento de carbohidratos es un mtodo para llevar un registro de la cantidad de carbohidratos que se ingieren.  La  ingesta natural de carbohidratos aumenta la cantidad de azcar (glucosa) en la sangre.  El recuento de la cantidad de carbohidratos que se ingieren sirve para Futures trader de glucosa en sangre dentro de los lmites [place], lo que ayuda a Pharmacologist la diabetes bajo control.  Un especialista en alimentacin y nutricin (nutricionista certificado) puede ayudarlo a crear un plan de alimentacin y a calcular la cantidad de carbohidratos que debe ingerir en cada comida y colacin. Esta informacin no tiene Theme park manager el consejo del mdico. Asegrese de hacerle al mdico cualquier pregunta que tenga. Document Revised: 05/24/2017 Document Reviewed: 01/14/2016 Elsevier Patient Education  2020 [employer].

## 2020-08-22 NOTE — Progress Notes (Signed)
Agree with assessment and plan as outlined.  

## 2020-08-25 MED FILL — LEVOTHYROXINE SODIUM 50 MCG: 50 | 30 days supply | Qty: 30 | Fill #1

## 2020-09-08 ENCOUNTER — Other Ambulatory Visit: Payer: Self-pay

## 2020-09-08 ENCOUNTER — Ambulatory Visit (INDEPENDENT_AMBULATORY_CARE_PROVIDER_SITE_OTHER): Payer: No Typology Code available for payment source

## 2020-09-08 ENCOUNTER — Other Ambulatory Visit (HOSPITAL_COMMUNITY): Payer: Self-pay | Admitting: Family Medicine

## 2020-09-08 ENCOUNTER — Encounter (HOSPITAL_COMMUNITY): Payer: Self-pay

## 2020-09-08 ENCOUNTER — Ambulatory Visit (HOSPITAL_COMMUNITY)
Admission: EM | Admit: 2020-09-08 | Discharge: 2020-09-08 | Disposition: A | Discharge status: Home / Self Care | Payer: No Typology Code available for payment source | Attending: Family Medicine | Admitting: Family Medicine

## 2020-09-08 DIAGNOSIS — M549 Dorsalgia, unspecified: Secondary | ICD-10-CM

## 2020-09-08 DIAGNOSIS — M545 Low back pain, unspecified: Secondary | ICD-10-CM

## 2020-09-08 DIAGNOSIS — W19XXXA Unspecified fall, initial encounter: Secondary | ICD-10-CM

## 2020-09-08 MED ORDER — PREDNISONE 20 MG PO TABS: 40.0000 mg | ORAL_TABLET | Freq: Every day | ORAL | 0 refills | Status: DC

## 2020-09-08 MED ORDER — CYCLOBENZAPRINE HCL 5 MG PO TABS: 5.0000 mg | ORAL_TABLET | Freq: Every evening | ORAL | 0 refills | Status: DC | PRN

## 2020-09-08 MED FILL — ?PREDNISONE 20MG TABLET: 20 | 3 days supply | Qty: 6 | Fill #0

## 2020-09-08 MED FILL — CYCLOBENZAPRINE 5 MG TABLET: 5 | 10 days supply | Qty: 10 | Fill #0

## 2020-09-08 NOTE — ED Provider Notes (Signed)
[facility]    CSN: [id number] Arrival date & time: 09/08/20  1779      History   Chief Complaint Chief Complaint  Patient presents with  . Fall    X 2 weeks ago at work    HPI Courtney Edwards is a 45 y.o. female.   Spanish medical interpreter used today to facilitate visit with patient's consent. Patient presenting today with 4 day history of low back pain and b/l leg pain, upper back pain, mildly blurry vision since a fall onto her backside onto concrete. Did not hit head directly or lose consciousness, and was ambulatory from scene of fall. States no memory issues, numbness, tingling, weakness, N/V. Taking advil with mild relief so far. History of intermittent back pain.      Past Medical History:  Diagnosis Date  . Hypothyroidism   . Low blood pressure   . Medical history non-contributory   . Mental disorder   . Post partum depression 2002    Patient Active Problem List   Diagnosis Date Noted  . Pterygium of left eye 03/01/2018  . Chronic left shoulder pain 04/19/2017  . Chronic back pain greater than 3 months duration 04/19/2017  . Dyslipidemia 04/19/2017  . Hypothyroidism 04/07/2017  . HLD (hyperlipidemia) 02/06/2015  . Dyspareunia 05/20/2014  . Unspecified constipation 08/06/2013  . Onychomycosis 08/06/2013  . Hemorrhoid 08/06/2013  . GERD (gastroesophageal reflux disease) 08/06/2013  . Muscle ache 08/06/2013  . LOW BACK PAIN, MILD 03/02/2007  . HYPERGLYCEMIA 11/03/2006    Past Surgical History:  Procedure Laterality Date  . LAPAROSCOPY N/A 02/09/2017   Procedure: LAPAROSCOPY DIAGNOSTIC;  Surgeon: Allie Bossier, MD;  Location: [facility];  Service: Gynecology;  Laterality: N/A;  . NO PAST SURGERIES      OB History    Gravida  5   Para  5   Term  5   Preterm  0   AB  0   Living  5     SAB  0   IAB  0   Ectopic      Multiple  0   Live Births  1            Home Medications    Prior to Admission  medications   Medication Sig Start Date End Date Taking? Authorizing Provider  atorvastatin (LIPITOR) 40 MG tablet Take 1 tablet (40 mg total) by mouth daily at 6 PM. 05/26/20 08/24/20 Yes Claiborne Rigg, NP  levothyroxine (SYNTHROID) 50 MCG tablet Take 1 tablet (50 mcg total) by mouth daily before breakfast. 05/26/20 06/25/20 Yes Claiborne Rigg, NP  omega-3 acid ethyl esters (LOVAZA) 1 g capsule Take 2 capsules (2 g total) by mouth 2 (two) times daily. 05/26/20 06/25/20 Yes Claiborne Rigg, NP  omeprazole (PRILOSEC) 20 MG capsule Take 1 capsule (20 mg total) by mouth 2 (two) times daily before a meal. Patient taking differently: Take 20 mg by mouth daily. 05/02/20 07/31/20 Yes Claiborne Rigg, NP  predniSONE (DELTASONE) 20 MG tablet Take 2 tablets (40 mg total) by mouth daily with breakfast. 09/08/20  Yes Particia Nearing, PA-C  cyclobenzaprine (FLEXERIL) 5 MG tablet Take 1 tablet (5 mg total) by mouth at bedtime as needed for muscle spasms. 09/08/20   Particia Nearing, PA-C    Family History Family History  Problem Relation Age of Onset  . Hypertension Mother   . Diabetes Mother   . Heart disease Mother   . Colon cancer  Neg Hx   . Pancreatic cancer Neg Hx   . Esophageal cancer Neg Hx   . Rectal cancer Neg Hx   . Stomach cancer Neg Hx   . Liver cancer Neg Hx     Social History Social History   Tobacco Use  . Smoking status: Never Smoker  . Smokeless tobacco: Never Used  Vaping Use  . Vaping Use: Never used  Substance Use Topics  . Alcohol use: No  . Drug use: No     Allergies   Patient has no known allergies.   Review of Systems Review of Systems PER HPI   Physical Exam Triage Vital Signs ED Triage Vitals  Enc Vitals Group     BP 09/08/20 0837 (!) 102/59     Pulse Rate 09/08/20 0837 61     Resp 09/08/20 0837 17     Temp 09/08/20 0837 98.2 F (36.8 C)     Temp Source 09/08/20 0837 Oral     SpO2 09/08/20 0837 100 %     Weight --      Height --       Head Circumference --      Peak Flow --      Pain Score 09/08/20 0840 7     Pain Loc --      Pain Edu? --      Excl. in GC? --    No data found.  Updated Vital Signs BP (!) 102/59 (BP Location: Right Arm)   Pulse 61   Temp 98.2 F (36.8 C) (Oral)   Resp 17   LMP  (LMP Unknown)   SpO2 100%   Visual Acuity Right Eye Distance: 20/25 (Without correction ) Left Eye Distance: 20/30 (Without correction) Bilateral Distance: 20/25 (Without correction)  Right Eye Near:   Left Eye Near:    Bilateral Near:     Physical Exam Vitals and nursing note reviewed.  Constitutional:      Appearance: Normal appearance. She is not ill-appearing.  HENT:     Head: Atraumatic.  Eyes:     Extraocular Movements: Extraocular movements intact.     Conjunctiva/sclera: Conjunctivae normal.  Cardiovascular:     Rate and Rhythm: Normal rate and regular rhythm.     Heart sounds: Normal heart sounds.  Pulmonary:     Effort: Pulmonary effort is normal.     Breath sounds: Normal breath sounds.  Musculoskeletal:        General: Tenderness (midline lumbar spine ttp diffusely) present. No swelling or deformity. Normal range of motion.     Cervical back: Normal range of motion and neck supple.     Comments: Strength 5/5 all 4 extremities - SLR b/l LEs  Skin:    General: Skin is warm and dry.     Findings: No bruising or erythema.  Neurological:     General: No focal deficit present.     Mental Status: She is alert and oriented to person, place, and time.     Cranial Nerves: No cranial nerve deficit.     Motor: No weakness.     Gait: Gait normal.  Psychiatric:        Mood and Affect: Mood normal.        Thought Content: Thought content normal.        Judgment: Judgment normal.      UC Treatments / Results  Labs (all labs ordered are listed, but only abnormal results are displayed) Labs Reviewed - No data to display  EKG   Radiology DG Lumbar Spine Complete  Result Date:  09/08/2020 CLINICAL DATA:  Pain following fall EXAM: LUMBAR SPINE - COMPLETE 4+ VIEW COMPARISON:  April 14, 2017 FINDINGS: Frontal, lateral, spot lumbosacral lateral, and bilateral oblique views were obtained. There are 5 non-rib-bearing lumbar type vertebral bodies. No fracture or spondylolisthesis. Disc spaces appear normal. There is no appreciable facet arthropathy. IMPRESSION: No fracture or spondylolisthesis.  No evident arthropathy. Electronically Signed   By: Bretta Bang III M.D.   On: 09/08/2020 09:36    Procedures Procedures (including critical care time)  Medications Ordered in UC Medications - No data to display  Initial Impression / Assessment and Plan / UC Course  I have reviewed the triage vital signs and the nursing notes.  Pertinent labs & imaging results that were available during my care of the patient were reviewed by me and considered in my medical decision making (see chart for details).     Neurologic exam, visual acuity, and x-ray lumbar spine all benign, reassuring. She is well appearing with stable vital signs. Will treat with short prednisone burst, flexeril and a few days off work to rest. Strict return precautions given.   Final Clinical Impressions(s) / UC Diagnoses   Final diagnoses:  Acute bilateral low back pain without sciatica  Upper back pain  Fall, initial encounter   Discharge Instructions   None    ED Prescriptions    Medication Sig Dispense Auth. Provider   predniSONE (DELTASONE) 20 MG tablet Take 2 tablets (40 mg total) by mouth daily with breakfast. 6 tablet Particia Nearing, PA-C   cyclobenzaprine (FLEXERIL) 5 MG tablet  (Status: Discontinued) Take 1 tablet (5 mg total) by mouth at bedtime as needed for muscle spasms. 10 tablet Particia Nearing, PA-C   cyclobenzaprine (FLEXERIL) 5 MG tablet Take 1 tablet (5 mg total) by mouth at bedtime as needed for muscle spasms. 10 tablet Particia Nearing, [place]     PDMP not  reviewed this encounter.   Roosvelt Maser [place], [place] 09/08/20 [phone]

## 2020-09-08 NOTE — ED Triage Notes (Signed)
Patient states that 2 weeks ago she fell at work and landed on her lower back. Pt complains of lower back pain as well as bilateral leg pain. Pt is aox4 and ambulatory.

## 2020-09-16 ENCOUNTER — Other Ambulatory Visit: Payer: Self-pay | Admitting: Gastroenterology

## 2020-09-17 LAB — SARS CORONAVIRUS 2 (TAT 6-24 HRS): SARS Coronavirus 2: NEGATIVE

## 2020-09-18 ENCOUNTER — Ambulatory Visit (AMBULATORY_SURGERY_CENTER): Payer: Self-pay | Admitting: Gastroenterology

## 2020-09-18 ENCOUNTER — Encounter: Payer: Self-pay | Admitting: Gastroenterology

## 2020-09-18 ENCOUNTER — Other Ambulatory Visit: Payer: Self-pay

## 2020-09-18 VITALS — BP 112/59 | HR 58 | Temp 98.2°F | Resp 12 | Ht 65.0 in | Wt 183.0 lb

## 2020-09-18 DIAGNOSIS — K2950 Unspecified chronic gastritis without bleeding: Secondary | ICD-10-CM

## 2020-09-18 DIAGNOSIS — K2970 Gastritis, unspecified, without bleeding: Secondary | ICD-10-CM

## 2020-09-18 DIAGNOSIS — R1013 Epigastric pain: Secondary | ICD-10-CM

## 2020-09-18 DIAGNOSIS — K219 Gastro-esophageal reflux disease without esophagitis: Secondary | ICD-10-CM

## 2020-09-18 MED ORDER — SODIUM CHLORIDE 0.9 % IV SOLN: 500.0000 mL | INTRAVENOUS | Status: DC

## 2020-09-18 NOTE — Progress Notes (Signed)
Assisted by Adolm Joseph

## 2020-09-18 NOTE — Progress Notes (Signed)
Called to room to assist during endoscopic procedure.  Patient ID and intended procedure confirmed with present staff. Received instructions for my participation in the procedure from the performing physician.  

## 2020-09-18 NOTE — Op Note (Signed)
[facility] Patient Name: Courtney Edwards Procedure Date: 09/18/2020 11:09 AM MRN: [id number] Endoscopist: Viviann Spare P. Adela Lank , MD Age: 45 Referring MD:  Date of Birth: August 22, 1975 Gender: Female Account #: [id number] Procedure:                Upper GI endoscopy Indications:              Epigastric abdominal pain / burning sensation,                            Follow-up of gastro-esophageal reflux disease -                            partial response to omeprazole 20mg  twice daily Medicines:                Monitored Anesthesia Care Procedure:                Pre-Anesthesia Assessment:                           - Prior to the procedure, a History and Physical                            was performed, and patient medications and                            allergies were reviewed. The patient's tolerance of                            previous anesthesia was also reviewed. The risks                            and benefits of the procedure and the sedation                            options and risks were discussed with the patient.                            All questions were answered, and informed consent                            was obtained. Prior Anticoagulants: The patient has                            taken no previous anticoagulant or antiplatelet                            agents. ASA Grade Assessment: II - A patient with                            mild systemic disease. After reviewing the risks                            and benefits, the patient was deemed in  satisfactory condition to undergo the procedure.                           After obtaining informed consent, the endoscope was                            passed under direct vision. Throughout the                            procedure, the patient's blood pressure, pulse, and                            oxygen saturations were monitored continuously. The                             Endoscope was introduced through the mouth, and                            advanced to the second part of duodenum. The upper                            GI endoscopy was accomplished without difficulty.                            The patient tolerated the procedure well. Scope In: Scope Out: Findings:                 Esophagogastric landmarks were identified: the                            Z-line was found at 35 cm, the gastroesophageal                            junction was found at 35 cm and the upper extent of                            the gastric folds was found at 35 cm from the                            incisors.                           The exam of the esophagus was otherwise normal. No                            erosive changes noted.                           The entire examined stomach was normal. No ulcers.                            Biopsies were taken with a cold forceps for  Helicobacter pylori testing.                           The duodenal bulb and second portion of the                            duodenum were normal. Complications:            No immediate complications. Estimated blood loss:                            Minimal. Estimated Blood Loss:     Estimated blood loss was minimal. Impression:               - Esophagogastric landmarks identified.                           - Normal esophagus otherwise - no erosive changes.                           - Normal stomach. Biopsied to rule out H pylori.                           - Normal duodenal bulb and second portion of the                            duodenum. Recommendation:           - Patient has a contact number available for                            emergencies. The signs and symptoms of potential                            delayed complications were discussed with the                            patient. Return to normal activities tomorrow.                            Written  discharge instructions were provided to the                            patient.                           - Resume previous diet.                           - Continue present medications. Continue omeprazole                            if that has helped somewhat. Consideration for                            trial of FD gard OTC to see if that helps while  biopsies pending                           - Await pathology results. Viviann Spare P. Steffi Noviello, MD 09/18/2020 11:26:11 AM This report has been signed electronically.

## 2020-09-18 NOTE — Progress Notes (Addendum)
Pt speaks Spanish.  Courtney Edwards is her Risk manager.   Pt to have trial of FD Gard OTC Probiotic to see if helps while awaiting biospies.  Also continue OMEPRAZOLE  Per Dr. Adela Lank.  No problems noted in the recovery room. Maw  Pt's daughter, Nita Sickle speaks english and they live together.  AVS in english. maw

## 2020-09-18 NOTE — Progress Notes (Signed)
A/ox3, pleased with MAC, report to RN 

## 2020-09-18 NOTE — Patient Instructions (Addendum)
Continue OMEPRAZOLE and pick up from pharmacy over the counter FD Delene Ruffini - Probiotic to see if helps with symptioms while awating biopsy results. You may resume your other current medications today. Await biopsy results.  May take 1-3 weeks to receive pathology results. Please call if any questions or concerns.    YOU HAD AN ENDOSCOPIC PROCEDURE TODAY AT THE [facility]:   Refer to the procedure report that was given to you for any specific questions about what was found during the examination.  If the procedure report does not answer your questions, please call your gastroenterologist to clarify.  If you requested that your care partner not be given the details of your procedure findings, then the procedure report has been included in a sealed envelope for you to review at your convenience later.  YOU SHOULD EXPECT: Some feelings of bloating in the abdomen. Passage of more gas than usual.  Walking can help get rid of the air that was put into your GI tract during the procedure and reduce the bloating. If you had a lower endoscopy (such as a colonoscopy or flexible sigmoidoscopy) you may notice spotting of blood in your stool or on the toilet paper. If you underwent a bowel prep for your procedure, you may not have a normal bowel movement for a few days.  Please Note:  You might notice some irritation and congestion in your nose or some drainage.  This is from the oxygen used during your procedure.  There is no need for concern and it should clear up in a day or so.  SYMPTOMS TO REPORT IMMEDIATELY:    Following upper endoscopy (EGD)  Vomiting of blood or coffee ground material  New chest pain or pain under the shoulder blades  Painful or persistently difficult swallowing  New shortness of breath  Fever of 100F or higher  Black, tarry-looking stools  For urgent or emergent issues, a gastroenterologist can be reached at any hour by calling [phone]. Do not use MyChart  messaging for urgent concerns.    DIET:  We do recommend a small meal at first, but then you may proceed to your regular diet.  Drink plenty of fluids but you should avoid alcoholic beverages for 24 hours.  ACTIVITY:  You should plan to take it easy for the rest of today and you should NOT DRIVE or use heavy machinery until tomorrow (because of the sedation medicines used during the test).    FOLLOW UP: Our staff will call the number listed on your records 48-72 hours following your procedure to check on you and address any questions or concerns that you may have regarding the information given to you following your procedure. If we do not reach you, we will leave a message.  We will attempt to reach you two times.  During this call, we will ask if you have developed any symptoms of COVID 19. If you develop any symptoms (ie: fever, flu-like symptoms, shortness of breath, cough etc.) before then, please call [phone].  If you test positive for Covid 19 in the 2 weeks post procedure, please call and report this information to [place].    If any biopsies were taken you will be contacted by phone or by letter within the next 1-3 weeks.  Please call us at [phone] if you have not heard about the biopsies in 3 weeks.    SIGNATURES/CONFIDENTIALITY: You and/or your care partner have signed paperwork which will be entered into your electronic  medical record.  These signatures attest to the fact that that the information above on your After Visit Summary has been reviewed and is understood.  Full responsibility of the confidentiality of this discharge information lies with you and/or your care-partner.

## 2020-09-22 ENCOUNTER — Telehealth: Payer: Self-pay | Admitting: *Deleted

## 2020-09-22 NOTE — Telephone Encounter (Signed)
  Follow up Call-  Call back number 09/18/2020  Post procedure Call Back phone  # [phone]  Permission to leave phone message Yes  Some recent data might be hidden     Patient questions:  Do you have a fever, pain , or abdominal swelling? No. Pain Score  0 *  Have you tolerated food without any problems? Yes.    Have you been able to return to your normal activities? Yes.    Do you have any questions about your discharge instructions: Diet   No. Medications  No. Follow up visit  No.  Do you have questions or concerns about your Care? No.  Actions: * If pain score is 4 or above: No action needed, pain <4.  Spoke with pts daughter that speaks english.  States that pt is doing fine.

## 2020-10-16 ENCOUNTER — Ambulatory Visit: Payer: Self-pay | Attending: Nurse Practitioner

## 2020-10-16 ENCOUNTER — Telehealth: Payer: Self-pay | Admitting: Nurse Practitioner

## 2020-10-16 ENCOUNTER — Other Ambulatory Visit: Payer: Self-pay | Admitting: Family Medicine

## 2020-10-16 ENCOUNTER — Other Ambulatory Visit: Payer: Self-pay

## 2020-10-16 DIAGNOSIS — E039 Hypothyroidism, unspecified: Secondary | ICD-10-CM

## 2020-10-16 MED ORDER — LEVOTHYROXINE SODIUM 50 MCG PO TABS: 50.0000 ug | ORAL_TABLET | Freq: Every day | ORAL | 0 refills | Status: DC

## 2020-10-16 NOTE — Telephone Encounter (Signed)
Rx sent 

## 2020-10-16 NOTE — Telephone Encounter (Signed)
Patient came in requesting refills of levothyroxine. Explained to patient she needed an appointment for additional refills- scheduled her for first available April 6. Can she get courtesy refill until then?

## 2020-10-17 MED FILL — ?LEVOTHYROXINE 50 MCG TABLE: 50 | 30 days supply | Qty: 30 | Fill #0

## 2020-10-20 NOTE — Telephone Encounter (Signed)
Called patient using interpreter services advising patient that I was calling from [facility] and that her Rx had been sent to her pharmacy. Advised patient if she had any questions or concerns to call [phone].

## 2020-10-21 MED FILL — ?OMEPRAZOLE 20 MG CPDR: 20 | 30 days supply | Qty: 60 | Fill #1

## 2020-10-21 MED FILL — ?ATORVASTATIN 40MG TABLET: 40 | 30 days supply | Qty: 30 | Fill #0

## 2020-10-27 ENCOUNTER — Ambulatory Visit: Payer: No Typology Code available for payment source

## 2020-11-15 ENCOUNTER — Other Ambulatory Visit: Payer: Self-pay

## 2020-11-20 ENCOUNTER — Ambulatory Visit: Payer: No Typology Code available for payment source | Admitting: Physician Assistant

## 2021-01-26 IMAGING — US ULTRASOUND ABDOMEN COMPLETE
2 of 3 series · 13 of 25 positions shown · non-contrast
Comparison: CT 02/22/2017

CLINICAL DATA: Epigastric pain.  Elevated liver function test.

EXAM:
ABDOMEN ULTRASOUND COMPLETE

[Series 1: ultrasound abdomen complete · 12 of 103 slices shown (1 of 2)]
[im 1/103]
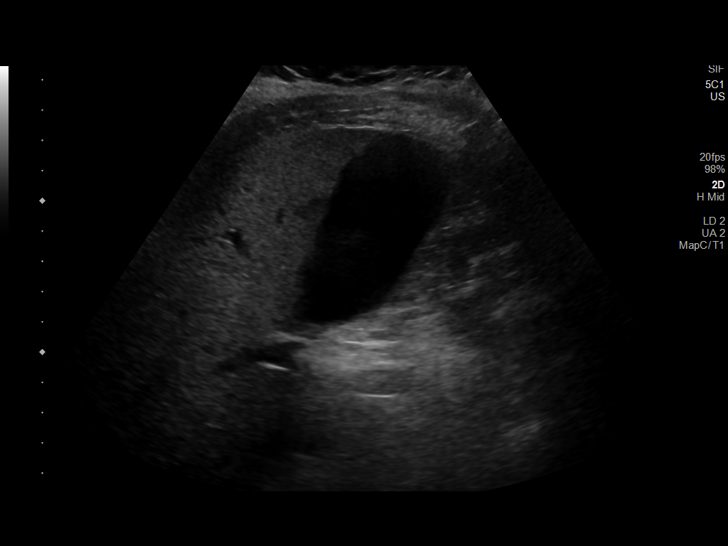
[im 10/103]
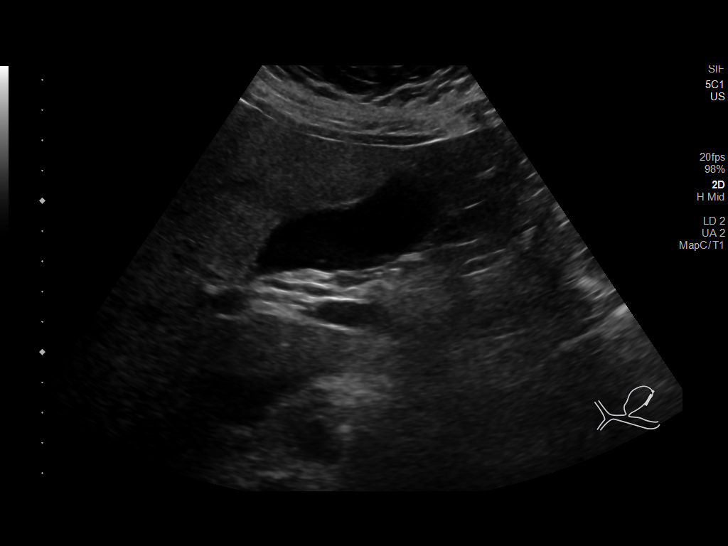
[im 19/103]
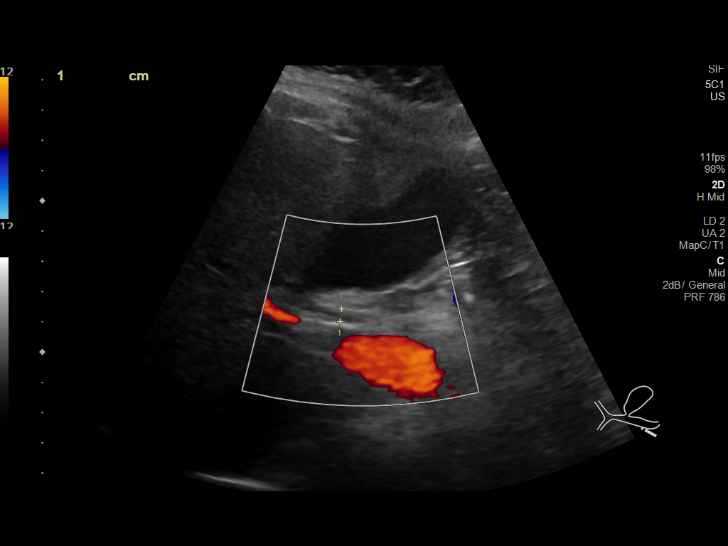
[im 28/103]
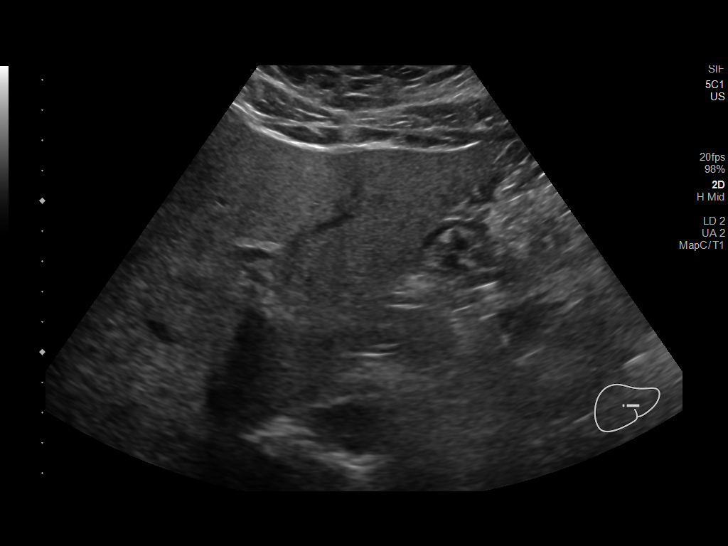
[im 38/103]
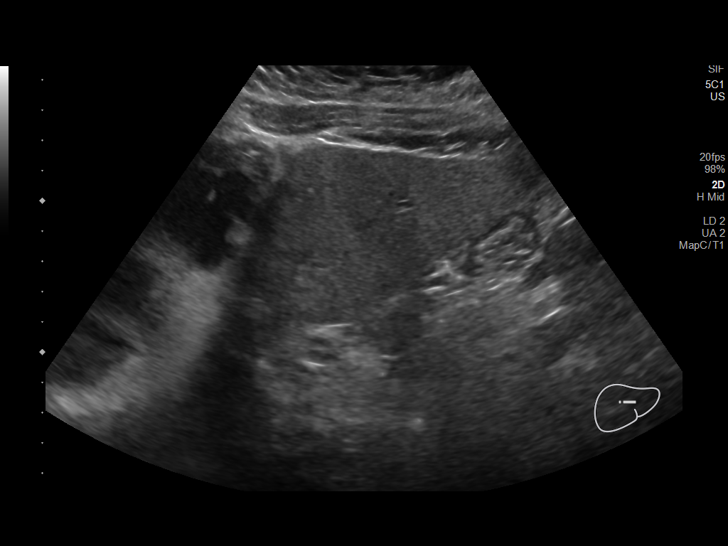
[im 47/103]
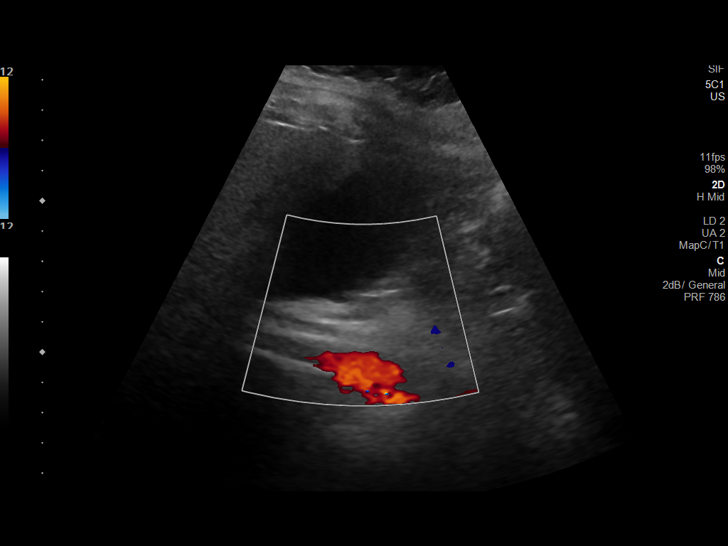
[im 56/103]
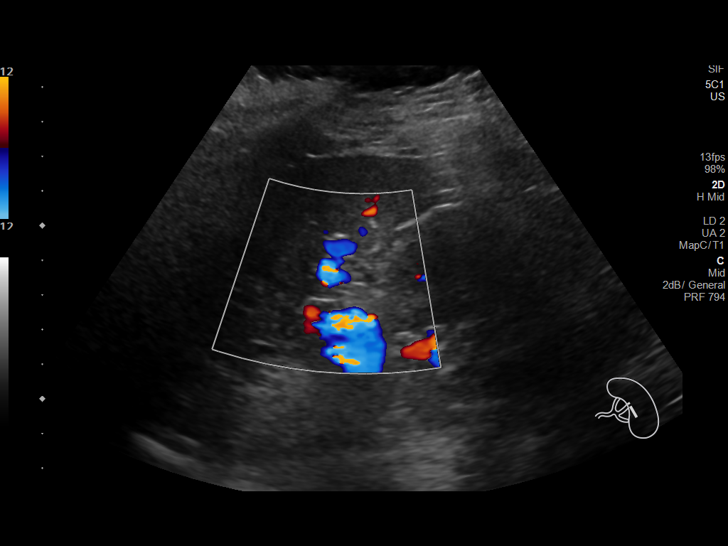
[im 65/103]
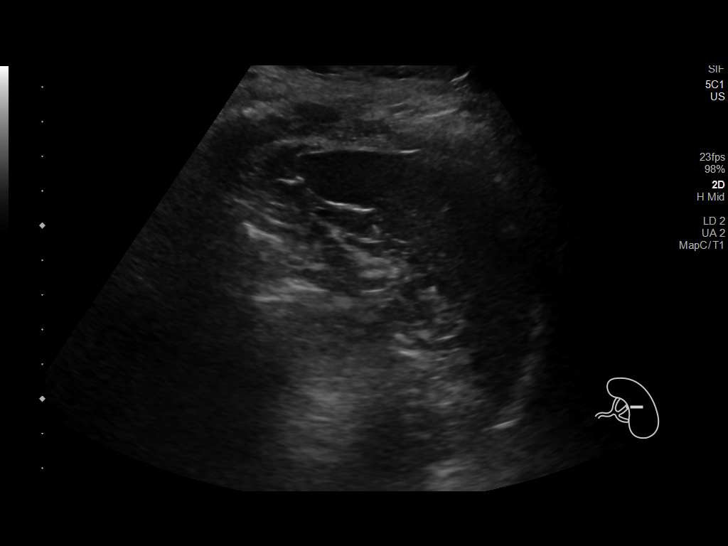
[im 75/103]
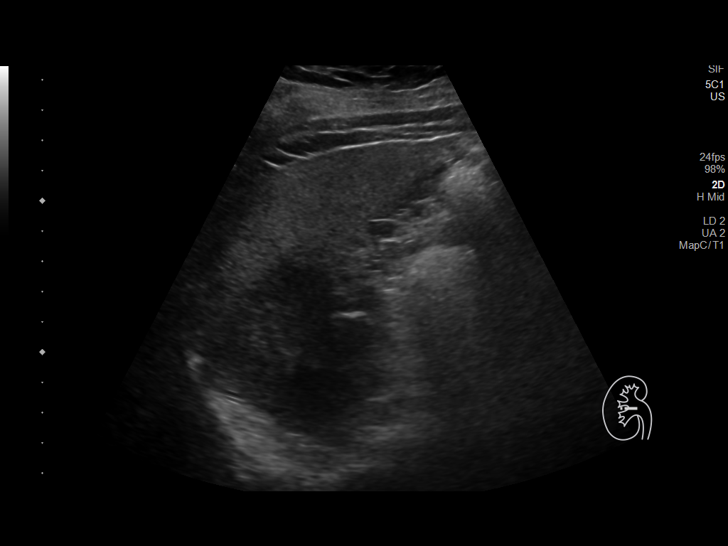
[im 84/103]
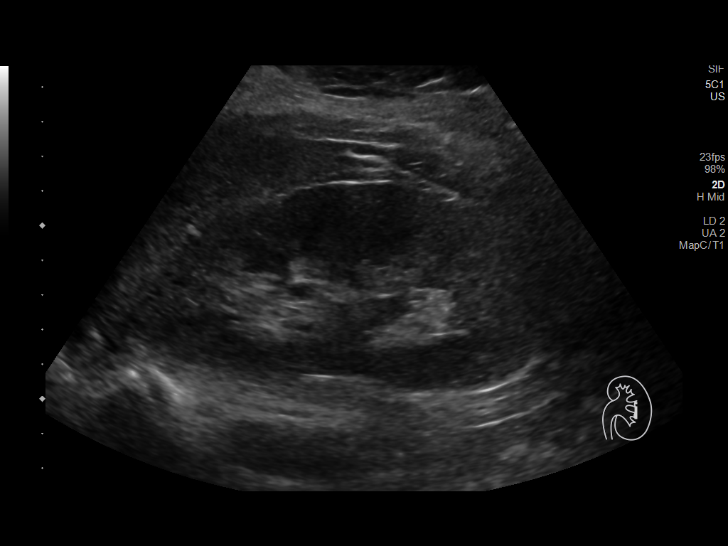
[im 93/103]
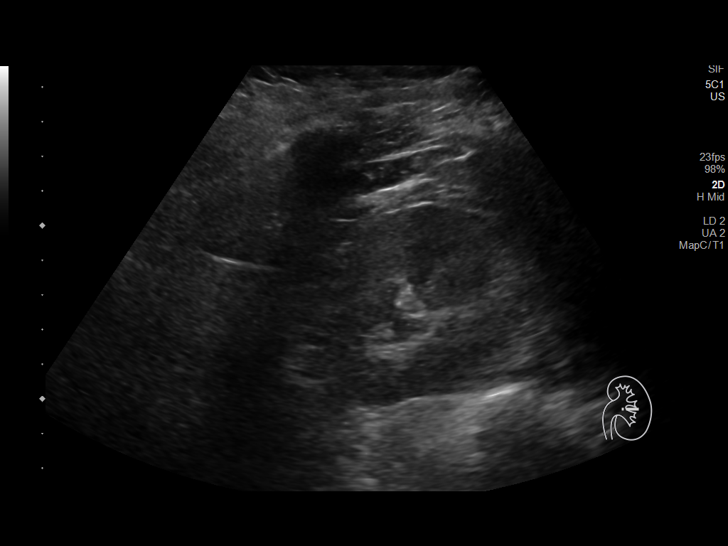
[im 103/103]
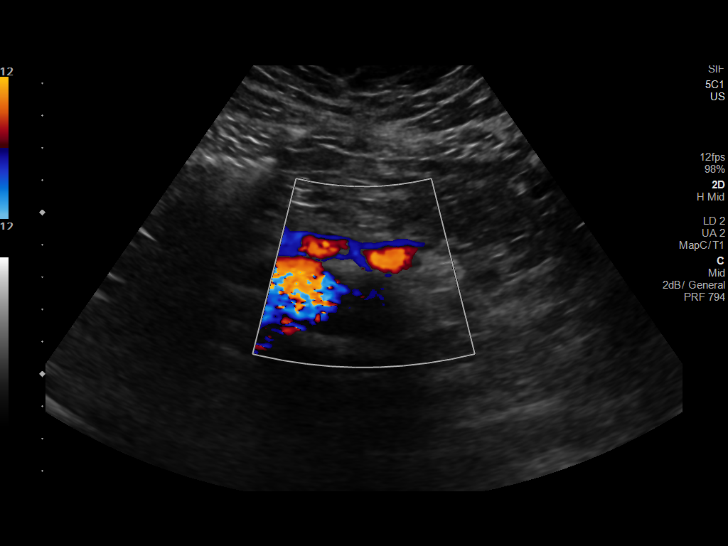

[Series 4: ultrasound abdomen complete · 1 of 1 slices shown (2 of 2)]
[im 1/1]
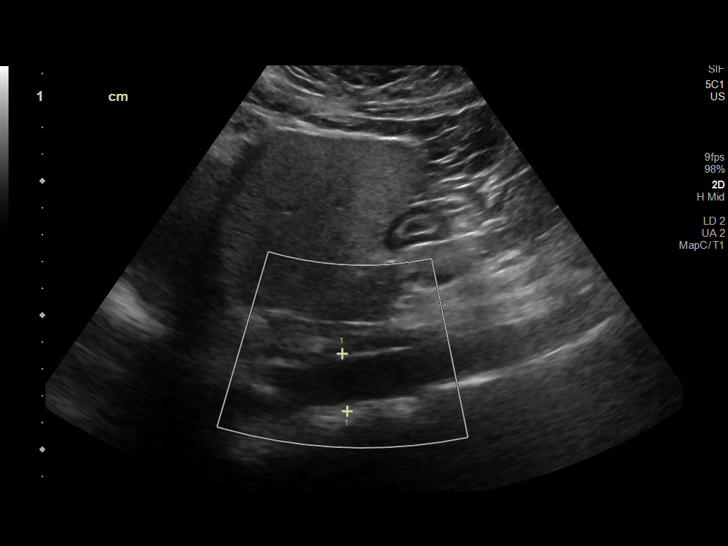

[13 of 25 positions shown; findings below may reference images not displayed]

FINDINGS: Gallbladder: No gallstones or wall thickening visualized. No
sonographic Murphy sign noted by sonographer.

Common bile duct: Diameter: 5 mm

Liver: Increased echogenicity consistent fatty infiltration or
hepatocellular disease. Portal vein is patent on color Doppler
imaging with normal direction of blood flow towards the liver.

IVC: No abnormality visualized.

Pancreas: Visualized portion unremarkable.

Spleen: Size and appearance within normal limits.

Right Kidney: Length: 11.0 cm. Echogenicity within normal limits. No
mass or hydronephrosis visualized.

Left Kidney: Length: 11.5 cm. Echogenicity within normal limits. No
mass or hydronephrosis visualized.

Abdominal aorta: No aneurysm visualized.

Other findings: None.
IMPRESSION: No acute or focal abnormality identified.

## 2022-06-22 ENCOUNTER — Ambulatory Visit: Payer: No Typology Code available for payment source | Attending: Nurse Practitioner

## 2022-06-23 ENCOUNTER — Other Ambulatory Visit (HOSPITAL_COMMUNITY)
Admission: RE | Admit: 2022-06-23 | Discharge: 2022-06-23 | Disposition: A | Discharge status: Home / Self Care | Payer: Self-pay | Source: Ambulatory Visit | Attending: Nurse Practitioner | Admitting: Nurse Practitioner

## 2022-06-23 ENCOUNTER — Encounter: Payer: Self-pay | Admitting: Nurse Practitioner

## 2022-06-23 ENCOUNTER — Ambulatory Visit (HOSPITAL_BASED_OUTPATIENT_CLINIC_OR_DEPARTMENT_OTHER): Payer: Self-pay | Admitting: Nurse Practitioner

## 2022-06-23 VITALS — BP 102/66 | HR 97 | Temp 98.0°F | Ht 65.0 in | Wt 193.4 lb

## 2022-06-23 DIAGNOSIS — E7800 Pure hypercholesterolemia, unspecified: Secondary | ICD-10-CM

## 2022-06-23 DIAGNOSIS — Z0000 Encounter for general adult medical examination without abnormal findings: Secondary | ICD-10-CM

## 2022-06-23 DIAGNOSIS — R102 Pelvic and perineal pain: Secondary | ICD-10-CM | POA: Insufficient documentation

## 2022-06-23 DIAGNOSIS — Z0001 Encounter for general adult medical examination with abnormal findings: Secondary | ICD-10-CM

## 2022-06-23 DIAGNOSIS — E039 Hypothyroidism, unspecified: Secondary | ICD-10-CM

## 2022-06-23 DIAGNOSIS — Z1231 Encounter for screening mammogram for malignant neoplasm of breast: Secondary | ICD-10-CM

## 2022-06-23 DIAGNOSIS — R7303 Prediabetes: Secondary | ICD-10-CM

## 2022-06-23 DIAGNOSIS — R748 Abnormal levels of other serum enzymes: Secondary | ICD-10-CM

## 2022-06-23 DIAGNOSIS — R7989 Other specified abnormal findings of blood chemistry: Secondary | ICD-10-CM

## 2022-06-23 NOTE — Progress Notes (Signed)
Assessment & Plan:  Courtney Edwards was seen today for annual exam.  Diagnoses and all orders for this visit:  Encounter for annual physical exam  Pelvic pain -     Urinalysis, Complete -     Cervicovaginal ancillary only  Elevated liver enzymes -     CMP14+EGFR  Hypothyroidism, unspecified type -     Thyroid Panel With TSH -     US THYROID; Future  Hypercholesterolemia -     Lipid panel  Abnormal CBC -     CBC with Differential  Breast cancer screening by mammogram -     MS DIGITAL SCREENING TOMO BILATERAL; Future  Prediabetes -     Hemoglobin A1c    Patient has been counseled on age-appropriate routine health concerns for screening and prevention. These are reviewed and up-to-date. Referrals have been placed accordingly. Immunizations are up-to-date or declined.    Subjective:   Chief Complaint  Patient presents with   Annual Exam    Pelvic pain and burning during menstrual.   HPI Courtney Edwards 46 y.o. female presents to office today for annual physical. She has complaints of pelvic pain that only occurs with her menstrual cycles. Denies abnormal uterine bleeding, dysuria or any other GU symptoms.   Patient has been counseled on age-appropriate routine health concerns for screening and prevention. These are reviewed and up-to-date. Referrals have been placed accordingly. Immunizations are up-to-date or declined.     MAMMOGRAM: OVERDUE. Referral placed today PAP SMEAR: UTD   Review of Systems  Constitutional:  Negative for fever, malaise/fatigue and weight loss.  HENT: Negative.  Negative for nosebleeds.   Eyes: Negative.  Negative for blurred vision, double vision and photophobia.  Respiratory: Negative.  Negative for cough and shortness of breath.   Cardiovascular: Negative.  Negative for chest pain, palpitations and leg swelling.  Gastrointestinal: Negative.  Negative for heartburn, nausea and vomiting.  Genitourinary:        SEE HPI   Musculoskeletal: Negative.  Negative for myalgias.  Skin: Negative.   Neurological: Negative.  Negative for dizziness, focal weakness, seizures and headaches.  Endo/Heme/Allergies: Negative.   Psychiatric/Behavioral: Negative.  Negative for suicidal ideas.     Past Medical History:  Diagnosis Date   GERD (gastroesophageal reflux disease)    Hyperlipidemia    Hypothyroidism    Low blood pressure    Medical history non-contributory    Mental disorder    Post partum depression 2002    Past Surgical History:  Procedure Laterality Date   LAPAROSCOPY N/A 02/09/2017   Procedure: LAPAROSCOPY DIAGNOSTIC;  Surgeon: Emily Filbert, MD;  Location: [facility];  Service: Gynecology;  Laterality: N/A;   NO PAST SURGERIES      Family History  Problem Relation Age of Onset   Hypertension Mother    Diabetes Mother    Heart disease Mother    Colon cancer Neg Hx    Pancreatic cancer Neg Hx    Esophageal cancer Neg Hx    Rectal cancer Neg Hx    Stomach cancer Neg Hx    Liver cancer Neg Hx     Social History Reviewed with no changes to be made today.   Outpatient Medications Prior to Visit  Medication Sig Dispense Refill   atorvastatin (LIPITOR) 40 MG tablet TAKE 1 TABLET (40 MG TOTAL) BY MOUTH DAILY AT 6 PM. 90 tablet 1   levothyroxine (SYNTHROID) 50 MCG tablet TAKE 1 TABLET (50 MCG TOTAL) BY MOUTH DAILY BEFORE BREAKFAST.  30 tablet 0   omega-3 acid ethyl esters (LOVAZA) 1 g capsule Take 2 capsules (2 g total) by mouth 2 (two) times daily. 120 capsule 3   omega-3 acid ethyl esters (LOVAZA) 1 g capsule TAKE 2 CAPSULES (2 G TOTAL) BY MOUTH 2 (TWO) TIMES DAILY. (Patient not taking: Reported on 05/01/2020) 120 capsule 3   omeprazole (PRILOSEC) 20 MG capsule Take 1 capsule (20 mg total) by mouth 2 (two) times daily before a meal. (Patient taking differently: Take 20 mg by mouth daily.) 180 capsule 0   No facility-administered medications prior to visit.    No Known Allergies     Objective:     BP 102/66   Pulse 97   Temp 98 F (36.7 C) (Temporal)   Ht _0  (1.651 m)   Wt 193 lb 6.4 oz (87.7 kg)   LMP 05/31/2022 (Approximate)   SpO2 98%   BMI 32.18 kg/m  Wt Readings from Last 3 Encounters:  06/23/22 193 lb 6.4 oz (87.7 kg)  09/18/20 183 lb (83 kg)  08/21/20 183 lb 3.2 oz (83.1 kg)    Physical Exam Constitutional:      Appearance: She is well-developed.  HENT:     Head: Normocephalic and atraumatic.     Right Ear: Hearing, tympanic membrane, ear canal and external ear normal.     Left Ear: Hearing, tympanic membrane, ear canal and external ear normal.     Nose: Nose normal.     Right Turbinates: Not enlarged.     Left Turbinates: Not enlarged.     Mouth/Throat:     Lips: Pink.     Mouth: Mucous membranes are moist.     Dentition: No dental tenderness, gingival swelling, dental abscesses or gum lesions.     Pharynx: No oropharyngeal exudate.  Eyes:     General: No scleral icterus.       Right eye: No discharge.     Extraocular Movements: Extraocular movements intact.     Conjunctiva/sclera: Conjunctivae normal.     Pupils: Pupils are equal, round, and reactive to light.  Neck:     Thyroid: No thyromegaly.     Trachea: No tracheal deviation.  Cardiovascular:     Rate and Rhythm: Normal rate and regular rhythm.     Heart sounds: Normal heart sounds. No murmur heard.    No friction rub.  Pulmonary:     Effort: Pulmonary effort is normal. No accessory muscle usage or respiratory distress.     Breath sounds: Normal breath sounds. No decreased breath sounds, wheezing, rhonchi or rales.  Abdominal:     General: Bowel sounds are normal. There is no distension.     Palpations: Abdomen is soft. There is no mass.     Tenderness: There is no abdominal tenderness. There is no right CVA tenderness, left CVA tenderness, guarding or rebound.     Hernia: No hernia is present.  Musculoskeletal:        General: No tenderness or deformity. Normal range of motion.      Cervical back: Normal range of motion and neck supple.  Lymphadenopathy:     Cervical: No cervical adenopathy.  Skin:    General: Skin is warm and dry.     Findings: No erythema.  Neurological:     Mental Status: She is alert and oriented to person, place, and time.     Cranial Nerves: No cranial nerve deficit.     Motor: Motor function is intact.  Coordination: Coordination is intact. Coordination normal.     Gait: Gait is intact.     Deep Tendon Reflexes:     Reflex Scores:      Patellar reflexes are 1+ on the right side and 1+ on the left side. Psychiatric:        Attention and Perception: Attention normal.        Mood and Affect: Mood normal.        Speech: Speech normal.        Behavior: Behavior normal.        Thought Content: Thought content normal.        Judgment: Judgment normal.          Patient has been counseled extensively about nutrition and exercise as well as the importance of adherence with medications and regular follow-up. The patient was given clear instructions to go to ER or return to medical center if symptoms don't improve, worsen or new problems develop. The patient verbalized understanding.   Follow-up: Return in about 6 months (around 12/22/2022), or if symptoms worsen or fail to improve.   Gildardo Pounds, FNP-BC [facility], [place]   06/23/2022, 1:36 PM

## 2022-06-23 NOTE — Progress Notes (Signed)
Pt had questions a/b bill for today's visit. She has active orange card and had questions a/b CAFA. I told pt that she would be eligible to apply for FAP once a bill is generated. As of right now, no bills have generated in system for pt. Please direct pt to financial counselor once bill is received.  **e/e [employer]PL correction - made error on income calc  Pt. is elig for [facility] orange card..FPL=130%.. e/e 06/23/22 - 12/22/22.  **New card to be mailed to pt.

## 2022-06-24 ENCOUNTER — Ambulatory Visit (HOSPITAL_COMMUNITY)
Admission: RE | Admit: 2022-06-24 | Discharge: 2022-06-24 | Disposition: A | Discharge status: Home / Self Care | Payer: No Typology Code available for payment source | Source: Ambulatory Visit | Attending: Nurse Practitioner | Admitting: Nurse Practitioner

## 2022-06-24 DIAGNOSIS — E039 Hypothyroidism, unspecified: Secondary | ICD-10-CM | POA: Insufficient documentation

## 2022-06-24 LAB — URINALYSIS, COMPLETE
Bilirubin, UA: NEGATIVE
Glucose, UA: NEGATIVE
Ketones, UA: NEGATIVE
Nitrite, UA: NEGATIVE
Protein,UA: NEGATIVE
RBC, UA: NEGATIVE
Specific Gravity, UA: 1.022 (ref 1.005–1.030)
Urobilinogen, Ur: 0.2 mg/dL (ref 0.2–1.0)
pH, UA: 5.5 (ref 5.0–7.5)

## 2022-06-24 LAB — CMP14+EGFR
ALT: 121 IU/L — ABNORMAL HIGH (ref 0–32)
AST: 74 IU/L — ABNORMAL HIGH (ref 0–40)
Albumin/Globulin Ratio: 1.4 (ref 1.2–2.2)
Albumin: 4.5 g/dL (ref 3.9–4.9)
Alkaline Phosphatase: 86 IU/L (ref 44–121)
BUN/Creatinine Ratio: 7 — ABNORMAL LOW (ref 9–23)
BUN: 5 mg/dL — ABNORMAL LOW (ref 6–24)
Bilirubin Total: 0.4 mg/dL (ref 0.0–1.2)
CO2: 17 mmol/L — ABNORMAL LOW (ref 20–29)
Calcium: 9.4 mg/dL (ref 8.7–10.2)
Chloride: 106 mmol/L (ref 96–106)
Creatinine, Ser: 0.67 mg/dL (ref 0.57–1.00)
Globulin, Total: 3.3 g/dL (ref 1.5–4.5)
Glucose: 95 mg/dL (ref 70–99)
Potassium: 4.3 mmol/L (ref 3.5–5.2)
Sodium: 141 mmol/L (ref 134–144)
Total Protein: 7.8 g/dL (ref 6.0–8.5)
eGFR: 110 mL/min/{1.73_m2} (ref >59)

## 2022-06-24 LAB — MICROSCOPIC EXAMINATION
Bacteria, UA: NONE SEEN
Casts: NONE SEEN /lpf
RBC, Urine: NONE SEEN /hpf (ref 0–2)

## 2022-06-24 LAB — CBC WITH DIFFERENTIAL/PLATELET
Basophils Absolute: 0.1 10*3/uL (ref 0.0–0.2)
Basos: 1 %
EOS (ABSOLUTE): 0.1 10*3/uL (ref 0.0–0.4)
Eos: 1 %
Hematocrit: 40.6 % (ref 34.0–46.6)
Hemoglobin: 13.3 g/dL (ref 11.1–15.9)
Immature Grans (Abs): 0 10*3/uL (ref 0.0–0.1)
Immature Granulocytes: 0 %
Lymphocytes Absolute: 3.1 10*3/uL (ref 0.7–3.1)
Lymphs: 36 %
MCH: 29.5 pg (ref 26.6–33.0)
MCHC: 32.8 g/dL (ref 31.5–35.7)
MCV: 90 fL (ref 79–97)
Monocytes Absolute: 0.7 10*3/uL (ref 0.1–0.9)
Monocytes: 8 %
Neutrophils Absolute: 4.6 10*3/uL (ref 1.4–7.0)
Neutrophils: 54 %
Platelets: 366 10*3/uL (ref 150–450)
RBC: 4.51 x10E6/uL (ref 3.77–5.28)
RDW: 13.8 % (ref 11.7–15.4)
WBC: 8.5 10*3/uL (ref 3.4–10.8)

## 2022-06-24 LAB — CERVICOVAGINAL ANCILLARY ONLY
Bacterial Vaginitis (gardnerella): NEGATIVE
Candida Glabrata: NEGATIVE
Candida Vaginitis: NEGATIVE
Chlamydia: NEGATIVE
Comment: NEGATIVE
Comment: NEGATIVE
Comment: NEGATIVE
Comment: NEGATIVE
Comment: NEGATIVE
Comment: NORMAL
Neisseria Gonorrhea: NEGATIVE
Trichomonas: NEGATIVE

## 2022-06-24 LAB — LIPID PANEL
Chol/HDL Ratio: 5.1 ratio — ABNORMAL HIGH (ref 0.0–4.4)
Cholesterol, Total: 236 mg/dL — ABNORMAL HIGH (ref 100–199)
HDL: 46 mg/dL (ref >39)
LDL Chol Calc (NIH): 163 mg/dL — ABNORMAL HIGH (ref 0–99)
Triglycerides: 150 mg/dL — ABNORMAL HIGH (ref 0–149)
VLDL Cholesterol Cal: 27 mg/dL (ref 5–40)

## 2022-06-24 LAB — THYROID PANEL WITH TSH
Free Thyroxine Index: 1.3 (ref 1.2–4.9)
T3 Uptake Ratio: 21 % — ABNORMAL LOW (ref 24–39)
T4, Total: 6.2 ug/dL (ref 4.5–12.0)
TSH: 3.64 u[IU]/mL (ref 0.450–4.500)

## 2022-06-24 LAB — HEMOGLOBIN A1C
Est. average glucose Bld gHb Est-mCnc: 134 mg/dL
Hgb A1c MFr Bld: 6.3 % — ABNORMAL HIGH (ref 4.8–5.6)

## 2022-06-26 ENCOUNTER — Other Ambulatory Visit: Payer: Self-pay | Admitting: Nurse Practitioner

## 2022-06-26 DIAGNOSIS — N3000 Acute cystitis without hematuria: Secondary | ICD-10-CM

## 2022-06-26 MED ORDER — NITROFURANTOIN MONOHYD MACRO 100 MG PO CAPS
100.0000 mg | ORAL_CAPSULE | Freq: Two times a day (BID) | ORAL | 0 refills | Status: AC
  Filled 2022-06-26: qty 10, 5d supply, fill #0

## 2022-06-28 ENCOUNTER — Other Ambulatory Visit: Payer: Self-pay

## 2022-06-29 ENCOUNTER — Other Ambulatory Visit: Payer: Self-pay

## 2022-06-29 ENCOUNTER — Ambulatory Visit: Payer: Self-pay | Admitting: Nurse Practitioner

## 2022-06-29 DIAGNOSIS — E785 Hyperlipidemia, unspecified: Secondary | ICD-10-CM

## 2022-06-29 MED ORDER — ATORVASTATIN CALCIUM 40 MG PO TABS
40.0000 mg | ORAL_TABLET | Freq: Every day | ORAL | 1 refills | Status: DC
  Filled 2022-06-29: qty 90, 90d supply, fill #0

## 2022-06-29 NOTE — Telephone Encounter (Signed)
Reason for Disposition  [1] Follow-up call to recent contact AND [2] information only call, no triage required  Answer Assessment - Initial Assessment Questions 1. REASON FOR CALL or QUESTION: "What is your reason for calling today?" or "How can I best help you?" or "What question do you have that I can help answer?"     Pt called in using someone as Spanish interpreter (friend or family).  I gave her the multiple lab result messages from Bertram Denver, NP from blood work, vaginal exam and ultrasound.  Pt mentioned she has been off all of her medications for 2 yrs.   Not been to a dr.   She needs the atorvastatin renewed.   I let her know about about the antibiotic for the UTI and that it was in the pharmacy here in the same building as the office.   She verbalized understanding.  I sent the request for the atorvastatin to Bertram Denver, NP at [facility].  Protocols used: Information Only Call - No Triage-A-AH

## 2022-07-15 ENCOUNTER — Telehealth: Payer: Self-pay | Admitting: Emergency Medicine

## 2022-07-15 NOTE — Telephone Encounter (Signed)
Copied from CRM [id number]. Topic: General - Other >> Jul 15, 2022 10:23 AM Macon Large wrote: Reason for CRM: Pt daughter requests call back from financial counselor because pt received some bills and they were told that they could bring those bills in for financial assistance. Cb# [phone]

## 2022-07-19 ENCOUNTER — Telehealth: Payer: Self-pay | Admitting: Emergency Medicine

## 2022-07-19 ENCOUNTER — Other Ambulatory Visit: Payer: Self-pay | Admitting: Nurse Practitioner

## 2022-07-19 DIAGNOSIS — H11002 Unspecified pterygium of left eye: Secondary | ICD-10-CM

## 2022-07-19 DIAGNOSIS — K089 Disorder of teeth and supporting structures, unspecified: Secondary | ICD-10-CM

## 2022-07-19 NOTE — Telephone Encounter (Signed)
Copied from CRM [id number]. Topic: Referral - Request for Referral >> Jul 19, 2022 10:56 AM Lyman Speller wrote: Has patient seen PCP for this complaint? No  *If NO, is insurance requiring patient see PCP for this issue before PCP can refer them? Referral for which specialty: dentist and Ophthalmologist  Preferred provider/office:  Reason for referral: orange card dept called pt and advised her to get a referral / please advise

## 2022-09-16 ENCOUNTER — Encounter (HOSPITAL_COMMUNITY): Payer: Self-pay

## 2022-09-16 ENCOUNTER — Ambulatory Visit (HOSPITAL_COMMUNITY)
Admission: EM | Admit: 2022-09-16 | Discharge: 2022-09-16 | Disposition: A | Discharge status: Home / Self Care | Payer: Self-pay | Attending: Emergency Medicine | Admitting: Emergency Medicine

## 2022-09-16 DIAGNOSIS — J029 Acute pharyngitis, unspecified: Secondary | ICD-10-CM

## 2022-09-16 DIAGNOSIS — N3000 Acute cystitis without hematuria: Secondary | ICD-10-CM

## 2022-09-16 LAB — POCT URINALYSIS DIPSTICK, ED / UC
Bilirubin Urine: NEGATIVE
Glucose, UA: NEGATIVE mg/dL
Hgb urine dipstick: NEGATIVE
Ketones, ur: NEGATIVE mg/dL
Nitrite: NEGATIVE
Protein, ur: NEGATIVE mg/dL
Specific Gravity, Urine: 1.025 (ref 1.005–1.030)
Urobilinogen, UA: 0.2 mg/dL (ref 0.0–1.0)
pH: 5.5 (ref 5.0–8.0)

## 2022-09-16 LAB — POC URINE PREG, ED: Preg Test, Ur: NEGATIVE

## 2022-09-16 LAB — POCT RAPID STREP A, ED / UC: Streptococcus, Group A Screen (Direct): NEGATIVE

## 2022-09-16 MED ORDER — CEPHALEXIN 500 MG PO CAPS: 500.0000 mg | ORAL_CAPSULE | Freq: Four times a day (QID) | ORAL | 0 refills | Status: DC

## 2022-09-16 NOTE — ED Provider Notes (Signed)
[facility]    CSN: [id number] Arrival date & time: 09/16/22  0840      History   Chief Complaint Chief Complaint  Patient presents with   Sore Throat    HPI Courtney Edwards [name] is a 47 y.o. female.   Patient presents today with 2 complaints.  Patient has a sore throat for the past several days now.  Denies any nasal congestion or known illness.  No cough congestion. Patient also presents today with lower abdominal pain left flank pain painful on urination for the past 3 days Denies any vaginal discharge last menstrual period was approximately 2 weeks ago patient has heavy menstrual cycles and this is normal.  Denies any known endometriosis.  Patient states that unknown fever but just does not feel well.  Has not taken anything prior to arrival.    Past Medical History:  Diagnosis Date   GERD (gastroesophageal reflux disease)    Hyperlipidemia    Hypothyroidism    Low blood pressure    Medical history non-contributory    Mental disorder    Post partum depression 2002    Patient Active Problem List   Diagnosis Date Noted   Pterygium of left eye 03/01/2018   Chronic left shoulder pain 04/19/2017   Chronic back pain greater than 3 months duration 04/19/2017   Dyslipidemia 04/19/2017   Hypothyroidism 04/07/2017   HLD (hyperlipidemia) 02/06/2015   Dyspareunia 05/20/2014   Unspecified constipation 08/06/2013   Onychomycosis 08/06/2013   Hemorrhoid 08/06/2013   GERD (gastroesophageal reflux disease) 08/06/2013   Muscle ache 08/06/2013   LOW BACK PAIN, MILD 03/02/2007   HYPERGLYCEMIA 11/03/2006    Past Surgical History:  Procedure Laterality Date   LAPAROSCOPY N/A 02/09/2017   Procedure: LAPAROSCOPY DIAGNOSTIC;  Surgeon: Emily Filbert, MD;  Location: [facility];  Service: Gynecology;  Laterality: N/A;   NO PAST SURGERIES      OB History     Gravida  5   Para  5   Term  5   Preterm  0   AB  0   Living  5      SAB  0   IAB  0    Ectopic      Multiple  0   Live Births  1            Home Medications    Prior to Admission medications   Medication Sig Start Date End Date Taking? Authorizing Provider  cephALEXin (KEFLEX) 500 MG capsule Take 1 capsule (500 mg total) by mouth 4 (four) times daily. 09/16/22  Yes Marney Setting, NP  levothyroxine (SYNTHROID) 50 MCG tablet TAKE 1 TABLET (50 MCG TOTAL) BY MOUTH DAILY BEFORE BREAKFAST. 10/16/20 10/16/21  Charlott Rakes, MD  omega-3 acid ethyl esters (LOVAZA) 1 g capsule Take 2 capsules (2 g total) by mouth 2 (two) times daily. 05/26/20 06/25/20  Gildardo Pounds, NP    Family History Family History  Problem Relation Age of Onset   Hypertension Mother    Diabetes Mother    Heart disease Mother    Colon cancer Neg Hx    Pancreatic cancer Neg Hx    Esophageal cancer Neg Hx    Rectal cancer Neg Hx    Stomach cancer Neg Hx    Liver cancer Neg Hx     Social History Social History   Tobacco Use   Smoking status: Never   Smokeless tobacco: Never  Vaping Use   Vaping Use: Never  used  Substance Use Topics   Alcohol use: No   Drug use: No     Allergies   Patient has no known allergies.   Review of Systems Review of Systems  Constitutional:  Positive for chills.  HENT:  Positive for sore throat. Negative for congestion.   Eyes: Negative.   Respiratory: Negative.    Cardiovascular: Negative.   Gastrointestinal:  Positive for abdominal pain and constipation.  Genitourinary:  Positive for frequency and pelvic pain. Negative for hematuria, vaginal bleeding, vaginal discharge and vaginal pain.  Skin: Negative.   Neurological: Negative.      Physical Exam Triage Vital Signs ED Triage Vitals  Enc Vitals Group     BP 09/16/22 0855 124/84     Pulse Rate 09/16/22 0855 76     Resp 09/16/22 0855 16     Temp 09/16/22 0855 98.3 F (36.8 C)     Temp Source 09/16/22 0855 Oral     SpO2 09/16/22 0855 96 %     Weight --      Height --      Head  Circumference --      Peak Flow --      Pain Score 09/16/22 0856 8     Pain Loc --      Pain Edu? --      Excl. in [place]? --    No data found.  Updated Vital Signs BP 124/84 (BP Location: Left Arm)   Pulse 76   Temp 98.3 F (36.8 C) (Oral)   Resp 16   LMP 09/01/2022 (Approximate)   SpO2 96%   Visual Acuity Right Eye Distance:   Left Eye Distance:   Bilateral Distance:    Right Eye Near:   Left Eye Near:    Bilateral Near:     Physical Exam Constitutional:      Appearance: She is well-developed.  HENT:     Right Ear: Tympanic membrane normal.     Left Ear: Tympanic membrane normal.     Nose: No congestion.     Mouth/Throat:     Mouth: Mucous membranes are moist. No oral lesions.     Pharynx: Pharyngeal swelling and posterior oropharyngeal erythema present. No oropharyngeal exudate or uvula swelling.     Tonsils: No tonsillar exudate or tonsillar abscesses. 1+ on the right. 1+ on the left.  Cardiovascular:     Rate and Rhythm: Normal rate.  Pulmonary:     Effort: Pulmonary effort is normal.     Breath sounds: Normal breath sounds.  Abdominal:     General: Bowel sounds are normal.     Palpations: Abdomen is soft.     Tenderness: There is abdominal tenderness.     Comments: Left CVA tenderness upon palpation that radiates to left lower groin area generalized lower abdominal pain upon palpation.  Musculoskeletal:     Cervical back: Normal range of motion.  Skin:    General: Skin is warm.  Neurological:     Mental Status: She is alert.      UC Treatments / Results  Labs (all labs ordered are listed, but only abnormal results are displayed) Labs Reviewed  POCT URINALYSIS DIPSTICK, ED / UC - Abnormal; Notable for the following components:      Result Value   Leukocytes,Ua TRACE (*)    All other components within normal limits  URINE CULTURE  POC URINE PREG, ED  POCT RAPID STREP A, ED / UC    EKG  Radiology No results found.  Procedures Procedures  (including critical care time)  Medications Ordered in UC Medications - No data to display  Initial Impression / Assessment and Plan / UC Course  I have reviewed the triage vital signs and the nursing notes.  Pertinent labs & imaging results that were available during my care of the patient were reviewed by me and considered in my medical decision making (see chart for details).     Discussed with patient and family member she may have an infection in her urine Patient should take full dose of antibiotics We will send her urine off for culture sore throat may just come from something viral in nature Patient is to drink plenty of fluids and eat while taking antibiotics If symptoms persist or become worse she will need to return Final Clinical Impressions(s) / UC Diagnoses   Final diagnoses:  Pharyngitis, unspecified etiology  Acute cystitis without hematuria     Discharge Instructions      Discussed with patient and family member she may have an infection in her urine strep test is negative Patient should take full dose of antibiotics We will send her urine off for culture sore throat may just come from something viral in nature Patient is to drink plenty of fluids and eat while taking antibiotics If symptoms persist or become worse she will need to return     ED Prescriptions     Medication Sig Dispense Auth. Provider   cephALEXin (KEFLEX) 500 MG capsule Take 1 capsule (500 mg total) by mouth 4 (four) times daily. 20 capsule Marney Setting, NP      PDMP not reviewed this encounter.   Marney Setting, NP 09/16/22 1025

## 2022-09-16 NOTE — ED Triage Notes (Signed)
Patient with c/o intermittent abdominal pain. Patient took a muscle relaxer with no help. Patient states warm compresses have helped. Patient also "feels sick". Daughter states the patient now has a sore throat.

## 2022-09-16 NOTE — Discharge Instructions (Addendum)
Discussed with patient and family member she may have an infection in her urine strep test is negative Patient should take full dose of antibiotics We will send her urine off for culture sore throat may just come from something viral in nature Patient is to drink plenty of fluids and eat while taking antibiotics If symptoms persist or become worse she will need to return

## 2022-09-24 ENCOUNTER — Ambulatory Visit: Payer: Self-pay | Admitting: *Deleted

## 2022-09-24 NOTE — Telephone Encounter (Signed)
Interpreter ID # [id number] Joe Chief Complaint: low abdominal pain. Told by UC to f/u with PCP Symptoms: low abdominal pain to ribs. Weakness and dizziness at times. On period 2 weeks ago and now again. Hx heavy bleeding. Treated for UTI and would like to be retested Frequency: 5 months for abdominal pain . 09/16/22 last seen in UC.  Pertinent Negatives: Patient denies fever, no urinary pain  Disposition: []$ ED /[x]$ Urgent Care (no appt availability in office) / []$ Appointment(In office/virtual)/ []$  [facility]/ []$ Home Care/ []$ Refused Recommended Disposition /[]$ [facility]/ []$  Follow-up with PCP Additional Notes:   Recommended UC again no available appt until March. Patient requesting to get ultra sound of liver or abdomin. Feels may be fatty liver. Please advise  if appt can be given sooner. Patient would like a call back.     Reason for Disposition  [1] MILD pain (e.g., does not interfere with normal activities) AND [2] pain comes and goes (cramps) AND [3] present > 48 hours  (Exception: This same abdominal pain is a chronic symptom recurrent or ongoing AND present > 4 weeks.)  Answer Assessment - Initial Assessment Questions 1. LOCATION: "Where does it hurt?"      Low abdominal pain from ovaries to upper abdomen ribs  2. RADIATION: "Does the pain shoot anywhere else?" (e.g., chest, back)     To ribs 3. ONSET: "When did the pain begin?" (e.g., minutes, hours or days ago)      3 weeks ago  4. SUDDEN: "Gradual or sudden onset?"     na 5. PATTERN "Does the pain come and go, or is it constant?"    - If it comes and goes: "How long does it last?" "Do you have pain now?"     (Note: Comes and goes means the pain is intermittent. It goes away completely between bouts.)    - If constant: "Is it getting better, staying the same, or getting worse?"      (Note: Constant means the pain never goes away completely; most serious pain is constant and gets worse.)      Comes and goes  6.  SEVERITY: "How bad is the pain?"  (e.g., Scale 1-10; mild, moderate, or severe)    - MILD (1-3): Doesn't interfere with normal activities, abdomen soft and not tender to touch.     - MODERATE (4-7): Interferes with normal activities or awakens from sleep, abdomen tender to touch.     - SEVERE (8-10): Excruciating pain, doubled over, unable to do any normal activities.       Continues after being seen at [facility] 09/16/22 7. RECURRENT SYMPTOM: "Have you ever had this type of stomach pain before?" If Yes, ask: "When was the last time?" and "What happened that time?"      Yes seen at [facility] 09/16/22 8. CAUSE: "What do you think is causing the stomach pain?"     UTI 9. RELIEVING/AGGRAVATING FACTORS: "What makes it better or worse?" (e.g., antacids, bending or twisting motion, bowel movement)     Better warm towel with salt water on abdomen 10. OTHER SYMPTOMS: "Do you have any other symptoms?" (e.g., back pain, diarrhea, fever, urination pain, vomiting)       Dizzy, weak, abdominal pain  low abdomen 11. PREGNANCY: "Is there any chance you are pregnant?" "When was your last menstrual period?"       na  Protocols used: Abdominal Pain - [facility]

## 2022-11-19 ENCOUNTER — Telehealth: Payer: Self-pay

## 2022-11-19 NOTE — Telephone Encounter (Signed)
Telephoned patient using interpreter# [id number]. Mailed scholarship application and completed screening process with patient.

## 2022-12-03 ENCOUNTER — Telehealth: Payer: Self-pay

## 2022-12-03 NOTE — Telephone Encounter (Signed)
Telephoned patient at mobile number using interpreter [phone]. [facility] (scholarship) unable to leave a voice message.

## 2022-12-14 ENCOUNTER — Ambulatory Visit: Payer: Self-pay | Attending: Nurse Practitioner

## 2022-12-22 ENCOUNTER — Other Ambulatory Visit (HOSPITAL_COMMUNITY)
Admission: RE | Admit: 2022-12-22 | Discharge: 2022-12-22 | Disposition: A | Discharge status: Home / Self Care | Payer: Self-pay | Source: Ambulatory Visit | Attending: Nurse Practitioner | Admitting: Nurse Practitioner

## 2022-12-22 ENCOUNTER — Other Ambulatory Visit: Payer: Self-pay | Admitting: Nurse Practitioner

## 2022-12-22 ENCOUNTER — Other Ambulatory Visit: Payer: Self-pay

## 2022-12-22 ENCOUNTER — Encounter: Payer: Self-pay | Admitting: Nurse Practitioner

## 2022-12-22 ENCOUNTER — Ambulatory Visit: Payer: Self-pay | Attending: Nurse Practitioner | Admitting: Nurse Practitioner

## 2022-12-22 VITALS — BP 109/73 | HR 58 | Ht 65.0 in | Wt 194.2 lb

## 2022-12-22 DIAGNOSIS — R7303 Prediabetes: Secondary | ICD-10-CM

## 2022-12-22 DIAGNOSIS — R399 Unspecified symptoms and signs involving the genitourinary system: Secondary | ICD-10-CM

## 2022-12-22 DIAGNOSIS — R1012 Left upper quadrant pain: Secondary | ICD-10-CM

## 2022-12-22 DIAGNOSIS — E7800 Pure hypercholesterolemia, unspecified: Secondary | ICD-10-CM

## 2022-12-22 DIAGNOSIS — E039 Hypothyroidism, unspecified: Secondary | ICD-10-CM

## 2022-12-22 NOTE — Progress Notes (Signed)
Assessment & Plan:  Courtney Edwards was seen today for hypothyroidism.  Diagnoses and all orders for this visit:  Hypothyroidism, unspecified type -     Thyroid Panel With TSH  Prediabetes -     Hemoglobin A1c -     CMP14+EGFR  LUQ abdominal pain She declined abdominal US -     US Abdomen Limited; Future  UTI symptoms -     Urinalysis, Complete -     Urine Culture -     Cervicovaginal ancillary only  Hypercholesterolemia -     Lipid panel    Patient has been counseled on age-appropriate routine health concerns for screening and prevention. These are reviewed and up-to-date. Referrals have been placed accordingly. Immunizations are up-to-date or declined.    Subjective:   Chief Complaint  Patient presents with   Hypothyroidism   HPI Courtney Edwards 47 y.o. female presents to office today for follow up to hypothyroidism   Hypothyroidism She has been off of her thyroid medication for over a year. States she was instructed to stop thyroid medication but is unsure who told her this.    Abdominal Pain Notes palpable LUQ pain. She does have a history chronic pelvic pain with normal imaging including abdominal and pelvic CT as well as ultrasound of the pelvis/transvaginal.  She also has a chronic history of pelvic pain occurring with menstrual cycles only.  Denies constipation or diarrhea reports having normal BMs (daily).   Associated symptoms; Nausea Has been taking her husband's medication.   Prediabetes Well controlled.  Lab Results  Component Value Date   HGBA1C 6.3 (H) 06/23/2022     Review of Systems  Constitutional:  Negative for fever, malaise/fatigue and weight loss.  HENT: Negative.  Negative for nosebleeds.   Eyes: Negative.  Negative for blurred vision, double vision and photophobia.  Respiratory: Negative.  Negative for cough and shortness of breath.   Cardiovascular: Negative.  Negative for chest pain, palpitations and leg swelling.   Gastrointestinal:  Positive for abdominal pain. Negative for blood in stool, constipation, diarrhea, heartburn, melena, nausea and vomiting.  Genitourinary: Negative.   Musculoskeletal: Negative.  Negative for myalgias.  Neurological: Negative.  Negative for dizziness, focal weakness, seizures and headaches.  Psychiatric/Behavioral: Negative.  Negative for suicidal ideas.     Past Medical History:  Diagnosis Date   GERD (gastroesophageal reflux disease)    Hyperlipidemia    Hypothyroidism    Low blood pressure    Medical history non-contributory    Mental disorder    Post partum depression 2002    Past Surgical History:  Procedure Laterality Date   LAPAROSCOPY N/A 02/09/2017   Procedure: LAPAROSCOPY DIAGNOSTIC;  Surgeon: Allie Bossier, MD;  Location: [facility];  Service: Gynecology;  Laterality: N/A;   NO PAST SURGERIES      Family History  Problem Relation Age of Onset   Hypertension Mother    Diabetes Mother    Heart disease Mother    Colon cancer Neg Hx    Pancreatic cancer Neg Hx    Esophageal cancer Neg Hx    Rectal cancer Neg Hx    Stomach cancer Neg Hx    Liver cancer Neg Hx     Social History Reviewed with no changes to be made today.   Outpatient Medications Prior to Visit  Medication Sig Dispense Refill   omega-3 acid ethyl esters (LOVAZA) 1 g capsule Take 2 capsules (2 g total) by mouth 2 (two) times daily. 120 capsule  3   levothyroxine (SYNTHROID) 50 MCG tablet TAKE 1 TABLET (50 MCG TOTAL) BY MOUTH DAILY BEFORE BREAKFAST. (Patient not taking: Reported on 12/22/2022) 30 tablet 0   cephALEXin (KEFLEX) 500 MG capsule Take 1 capsule (500 mg total) by mouth 4 (four) times daily. (Patient not taking: Reported on 12/22/2022) 20 capsule 0   No facility-administered medications prior to visit.    No Known Allergies     Objective:    BP 109/73   Pulse (!) 58   Ht 5\' 5"  (1.651 m)   Wt 194 lb 3.2 oz (88.1 kg)   LMP 12/04/2022 (Exact Date)   SpO2 98%   BMI 32.32  kg/m  Wt Readings from Last 3 Encounters:  12/22/22 194 lb 3.2 oz (88.1 kg)  06/23/22 193 lb 6.4 oz (87.7 kg)  09/18/20 183 lb (83 kg)    Physical Exam Vitals and nursing note reviewed.  Constitutional:      Appearance: She is well-developed.  HENT:     Head: Normocephalic and atraumatic.  Cardiovascular:     Rate and Rhythm: Regular rhythm. Bradycardia present.     Heart sounds: Normal heart sounds. No murmur heard.    No friction rub. No gallop.  Pulmonary:     Effort: Pulmonary effort is normal. No tachypnea or respiratory distress.     Breath sounds: Normal breath sounds. No decreased breath sounds, wheezing, rhonchi or rales.  Chest:     Chest wall: No tenderness.  Abdominal:     General: Bowel sounds are normal.     Palpations: Abdomen is soft.  Musculoskeletal:        General: Normal range of motion.     Cervical back: Normal range of motion.  Skin:    General: Skin is warm and dry.  Neurological:     Mental Status: She is alert and oriented to person, place, and time.     Coordination: Coordination normal.  Psychiatric:        Behavior: Behavior normal. Behavior is cooperative.        Thought Content: Thought content normal.        Judgment: Judgment normal.          Patient has been counseled extensively about nutrition and exercise as well as the importance of adherence with medications and regular follow-up. The patient was given clear instructions to go to ER or return to medical center if symptoms don't improve, worsen or new problems develop. The patient verbalized understanding.   Follow-up: Return in about 3 months (around 03/24/2023) for PAP SMEAR.   Claiborne Rigg, FNP-BC [facility] [place], [place] [phone]   12/22/2022, 1:23 PM

## 2022-12-23 LAB — CERVICOVAGINAL ANCILLARY ONLY
Bacterial Vaginitis (gardnerella): NEGATIVE
Candida Glabrata: NEGATIVE
Candida Vaginitis: NEGATIVE
Chlamydia: NEGATIVE
Comment: NEGATIVE
Comment: NEGATIVE
Comment: NEGATIVE
Comment: NEGATIVE
Comment: NEGATIVE
Comment: NORMAL
Neisseria Gonorrhea: NEGATIVE
Trichomonas: NEGATIVE

## 2022-12-23 LAB — MICROSCOPIC EXAMINATION
Bacteria, UA: NONE SEEN
Casts: NONE SEEN /lpf
RBC, Urine: NONE SEEN /hpf (ref 0–2)

## 2022-12-23 LAB — URINALYSIS, COMPLETE
Bilirubin, UA: NEGATIVE
Glucose, UA: NEGATIVE
Ketones, UA: NEGATIVE
Nitrite, UA: NEGATIVE
Protein,UA: NEGATIVE
RBC, UA: NEGATIVE
Specific Gravity, UA: 1.024 (ref 1.005–1.030)
Urobilinogen, Ur: 0.2 mg/dL (ref 0.2–1.0)
pH, UA: 5.5 (ref 5.0–7.5)

## 2022-12-24 LAB — CMP14+EGFR
AST: 67 IU/L — ABNORMAL HIGH (ref 0–40)
Albumin/Globulin Ratio: 1.1 — ABNORMAL LOW (ref 1.2–2.2)
Albumin: 4.2 g/dL (ref 3.9–4.9)
BUN: 8 mg/dL (ref 6–24)
Globulin, Total: 3.7 g/dL (ref 1.5–4.5)
Total Protein: 7.9 g/dL (ref 6.0–8.5)

## 2022-12-24 LAB — LIPID PANEL
Chol/HDL Ratio: 6.4 ratio — ABNORMAL HIGH (ref 0.0–4.4)
Cholesterol, Total: 232 mg/dL — ABNORMAL HIGH (ref 100–199)
HDL: 36 mg/dL — ABNORMAL LOW (ref >39)
LDL Chol Calc (NIH): 155 mg/dL — ABNORMAL HIGH (ref 0–99)
Triglycerides: 220 mg/dL — ABNORMAL HIGH (ref 0–149)
VLDL Cholesterol Cal: 41 mg/dL — ABNORMAL HIGH (ref 5–40)

## 2022-12-24 LAB — THYROID PANEL WITH TSH
T4, Total: 6.4 ug/dL (ref 4.5–12.0)
TSH: 3.26 u[IU]/mL (ref 0.450–4.500)

## 2022-12-24 LAB — HEMOGLOBIN A1C
Est. average glucose Bld gHb Est-mCnc: 134 mg/dL
Hgb A1c MFr Bld: 6.3 % — ABNORMAL HIGH (ref 4.8–5.6)

## 2022-12-24 LAB — URINE CULTURE: Organism ID, Bacteria: NO GROWTH

## 2022-12-28 ENCOUNTER — Other Ambulatory Visit: Payer: Self-pay | Admitting: Nurse Practitioner

## 2022-12-28 ENCOUNTER — Other Ambulatory Visit: Payer: Self-pay

## 2022-12-28 ENCOUNTER — Telehealth: Payer: Self-pay

## 2022-12-28 DIAGNOSIS — E782 Mixed hyperlipidemia: Secondary | ICD-10-CM

## 2022-12-28 DIAGNOSIS — E039 Hypothyroidism, unspecified: Secondary | ICD-10-CM

## 2022-12-28 DIAGNOSIS — E785 Hyperlipidemia, unspecified: Secondary | ICD-10-CM

## 2022-12-28 MED ORDER — LEVOTHYROXINE SODIUM 50 MCG PO TABS
50.0000 ug | ORAL_TABLET | Freq: Every day | ORAL | 1 refills | Status: DC
  Filled 2022-12-28: qty 90, 90d supply, fill #0

## 2022-12-28 MED ORDER — OMEGA-3-ACID ETHYL ESTERS 1 G PO CAPS
2.0000 g | ORAL_CAPSULE | Freq: Two times a day (BID) | ORAL | 3 refills | Status: DC
  Filled 2022-12-28: qty 120, 30d supply, fill #0

## 2022-12-28 MED ORDER — ATORVASTATIN CALCIUM 40 MG PO TABS
40.0000 mg | ORAL_TABLET | Freq: Every day | ORAL | 3 refills | Status: DC
  Filled 2022-12-28: qty 90, 90d supply, fill #0

## 2022-12-28 NOTE — Telephone Encounter (Signed)
Copied from CRM [id number]. Topic: General - Inquiry >> Dec 28, 2022  2:11 PM Marlow Baars wrote: Reason for CRM: The patient called in requesting to speak with someone about her labs from 05/08. Please call patient to go over with an interpreter.

## 2023-01-04 ENCOUNTER — Other Ambulatory Visit: Payer: Self-pay

## 2023-03-30 ENCOUNTER — Other Ambulatory Visit: Payer: Self-pay

## 2023-03-30 ENCOUNTER — Ambulatory Visit: Payer: Self-pay | Attending: Nurse Practitioner | Admitting: Nurse Practitioner

## 2023-03-30 ENCOUNTER — Encounter: Payer: Self-pay | Admitting: Nurse Practitioner

## 2023-03-30 VITALS — BP 104/69 | HR 77 | Ht 65.0 in | Wt 197.2 lb

## 2023-03-30 DIAGNOSIS — Z1211 Encounter for screening for malignant neoplasm of colon: Secondary | ICD-10-CM

## 2023-03-30 DIAGNOSIS — E7800 Pure hypercholesterolemia, unspecified: Secondary | ICD-10-CM

## 2023-03-30 DIAGNOSIS — E039 Hypothyroidism, unspecified: Secondary | ICD-10-CM

## 2023-03-30 DIAGNOSIS — E782 Mixed hyperlipidemia: Secondary | ICD-10-CM

## 2023-03-30 MED ORDER — ATORVASTATIN CALCIUM 40 MG PO TABS
40.0000 mg | ORAL_TABLET | Freq: Every day | ORAL | 3 refills | Status: DC
  Filled 2023-03-30: qty 90, 90d supply, fill #0

## 2023-03-30 NOTE — Progress Notes (Signed)
Patient her for pap. Would like to reschedule as she is on her menstrual cycle

## 2023-04-11 ENCOUNTER — Other Ambulatory Visit: Payer: Self-pay

## 2023-04-27 ENCOUNTER — Ambulatory Visit: Payer: Self-pay | Admitting: Nurse Practitioner

## 2023-05-16 ENCOUNTER — Other Ambulatory Visit: Payer: Self-pay

## 2023-05-16 ENCOUNTER — Encounter: Payer: Self-pay | Admitting: Nurse Practitioner

## 2023-05-16 ENCOUNTER — Other Ambulatory Visit (HOSPITAL_COMMUNITY)
Admission: RE | Admit: 2023-05-16 | Discharge: 2023-05-16 | Disposition: A | Discharge status: Home / Self Care | Payer: Self-pay | Source: Ambulatory Visit | Attending: Nurse Practitioner | Admitting: Nurse Practitioner

## 2023-05-16 ENCOUNTER — Ambulatory Visit: Payer: Self-pay | Attending: Nurse Practitioner | Admitting: Nurse Practitioner

## 2023-05-16 VITALS — BP 100/62 | HR 58 | Ht 65.0 in | Wt 193.0 lb

## 2023-05-16 DIAGNOSIS — E7800 Pure hypercholesterolemia, unspecified: Secondary | ICD-10-CM

## 2023-05-16 DIAGNOSIS — E039 Hypothyroidism, unspecified: Secondary | ICD-10-CM

## 2023-05-16 DIAGNOSIS — E785 Hyperlipidemia, unspecified: Secondary | ICD-10-CM

## 2023-05-16 DIAGNOSIS — Z124 Encounter for screening for malignant neoplasm of cervix: Secondary | ICD-10-CM

## 2023-05-16 DIAGNOSIS — Z23 Encounter for immunization: Secondary | ICD-10-CM

## 2023-05-16 DIAGNOSIS — N939 Abnormal uterine and vaginal bleeding, unspecified: Secondary | ICD-10-CM | POA: Insufficient documentation

## 2023-05-16 DIAGNOSIS — E782 Mixed hyperlipidemia: Secondary | ICD-10-CM

## 2023-05-16 DIAGNOSIS — R7989 Other specified abnormal findings of blood chemistry: Secondary | ICD-10-CM | POA: Insufficient documentation

## 2023-05-16 MED ORDER — ATORVASTATIN CALCIUM 40 MG PO TABS
40.0000 mg | ORAL_TABLET | Freq: Every day | ORAL | 3 refills | Status: DC
  Filled 2023-05-16: qty 90, 90d supply, fill #0

## 2023-05-16 MED ORDER — LEVOTHYROXINE SODIUM 50 MCG PO TABS
50.0000 ug | ORAL_TABLET | Freq: Every day | ORAL | 1 refills | Status: DC
  Filled 2023-05-16: qty 90, 90d supply, fill #0

## 2023-05-16 MED ORDER — OMEGA-3-ACID ETHYL ESTERS 1 G PO CAPS
2.0000 g | ORAL_CAPSULE | Freq: Two times a day (BID) | ORAL | 3 refills | Status: DC
  Filled 2023-05-16: qty 120, 30d supply, fill #0

## 2023-05-16 NOTE — Patient Instructions (Signed)
Clinic will call with results Refills sent Follow up in office in 6 months

## 2023-05-16 NOTE — Progress Notes (Addendum)
Assessment & Plan:  Courtney Edwards was seen today for gynecologic exam.  Diagnoses and all orders for this visit:  Encounter for Papanicolaou smear of cervix -     Cytology - PAP -     Cervicovaginal ancillary only  Hypothyroidism, unspecified type -     Thyroid Panel With TSH -     CMP14+EGFR -     levothyroxine (SYNTHROID) 50 MCG tablet; Take 1 tablet (50 mcg total) by mouth daily before breakfast. (Patient not taking: Reported on 05/16/2023)  Mixed hyperlipidemia -     Lipid panel -     atorvastatin (LIPITOR) 40 MG tablet; Take 1 tablet (40 mg total) by mouth daily. FOR CHOLESTEROL (Patient not taking: Reported on 05/16/2023)  Hypercholesterolemia -     Hemoglobin A1c  Abnormal CBC -     CBC with Differential  Abnormal uterine bleeding (AUB) -     US Pelvic Complete With Transvaginal; Future  Dyslipidemia -     omega-3 acid ethyl esters (LOVAZA) 1 g capsule; Take 2 capsules (2 g total) by mouth 2 (two) times daily. FOR cholesterol (Patient not taking: Reported on 05/16/2023)  Immunization due -     Flu vaccine trivalent PF, 6mos and older(Flulaval,Afluria,Fluarix,Fluzone)    Patient has been counseled on age-appropriate routine health concerns for screening and prevention. These are reviewed and up-to-date. Referrals have been placed accordingly. Immunizations are up-to-date or declined.    Subjective:   Chief Complaint  Patient presents with   Gynecologic Exam    Courtney Edwards 47 y.o. female presents to office today for PAP smear. PAP smear today was performed by the senior NP student Burnard Bunting. There was pelvic tenderness noted with bimanual exam. She also endorses prolonged heavy periods lasting up to 8 days. This is a new occurrence over the past year.  She has not been taking any of her medications in over a year. States they were not available from the pharmacy however it does not appear from her med list that she ever picked them up.    Review  of Systems  Constitutional: Negative.  Negative for chills, fever, malaise/fatigue and weight loss.  Respiratory: Negative.  Negative for cough, shortness of breath and wheezing.   Cardiovascular: Negative.  Negative for chest pain, orthopnea and leg swelling.  Gastrointestinal:  Negative for abdominal pain.  Genitourinary: Negative.  Negative for flank pain.       Dysmenorrhea and menorrhagia x 1 year  Skin: Negative.  Negative for rash.  Psychiatric/Behavioral:  Negative for suicidal ideas.     Past Medical History:  Diagnosis Date   GERD (gastroesophageal reflux disease)    Hyperlipidemia    Hypothyroidism    Low blood pressure    Medical history non-contributory    Mental disorder    Post partum depression 2002    Past Surgical History:  Procedure Laterality Date   LAPAROSCOPY N/A 02/09/2017   Procedure: LAPAROSCOPY DIAGNOSTIC;  Surgeon: Allie Bossier, MD;  Location: [facility];  Service: Gynecology;  Laterality: N/A;   NO PAST SURGERIES      Family History  Problem Relation Age of Onset   Hypertension Mother    Diabetes Mother    Heart disease Mother    Colon cancer Neg Hx    Pancreatic cancer Neg Hx    Esophageal cancer Neg Hx    Rectal cancer Neg Hx    Stomach cancer Neg Hx    Liver cancer Neg Hx  Social History Reviewed with no changes to be made today.   Outpatient Medications Prior to Visit  Medication Sig Dispense Refill   atorvastatin (LIPITOR) 40 MG tablet Take 1 tablet (40 mg total) by mouth daily. FOR CHOLESTEROL (Patient not taking: Reported on 03/30/2023) 90 tablet 3   levothyroxine (SYNTHROID) 50 MCG tablet Take 1 tablet (50 mcg total) by mouth daily before breakfast. (Patient not taking: Reported on 05/16/2023) 90 tablet 1   omega-3 acid ethyl esters (LOVAZA) 1 g capsule Take 2 capsules (2 g total) by mouth 2 (two) times daily. FOR cholesterol (Patient not taking: Reported on 05/16/2023) 120 capsule 3   No facility-administered medications prior to  visit.    No Known Allergies     Objective:    BP 100/62 (BP Location: Left Arm, Patient Position: Sitting, Cuff Size: Normal)   Pulse (!) 58   Ht 5\' 5"  (1.651 m)   Wt 87.5 kg   LMP 04/25/2023   SpO2 99%   BMI 32.12 kg/m  Wt Readings from Last 3 Encounters:  05/16/23 87.5 kg  03/30/23 89.4 kg  12/22/22 88.1 kg    Physical Exam Exam conducted with a chaperone present.  Constitutional:      Appearance: Normal appearance. She is well-developed.  HENT:     Head: Normocephalic.  Cardiovascular:     Rate and Rhythm: Normal rate and regular rhythm.     Heart sounds: Normal heart sounds.  Pulmonary:     Effort: Pulmonary effort is normal.     Breath sounds: Normal breath sounds.  Abdominal:     General: Bowel sounds are normal.     Palpations: Abdomen is soft.     Hernia: There is no hernia in the left inguinal area.  Genitourinary:    General: Normal vulva.     Exam position: Lithotomy position.     Labia:        Right: No rash, tenderness, lesion or injury.        Left: No rash, tenderness, lesion or injury.      Vagina: Normal. No signs of injury and foreign body. No vaginal discharge, erythema, tenderness or bleeding.     Cervix: Normal.     Uterus: Normal. Not deviated and not enlarged.      Adnexa: Right adnexa normal.       Right: No mass, tenderness or fullness.         Left: Tenderness present. No mass or fullness.       Rectum: Normal. No external hemorrhoid.  Lymphadenopathy:     Lower Body: No right inguinal adenopathy. No left inguinal adenopathy.  Skin:    General: Skin is warm and dry.  Neurological:     Mental Status: She is alert and oriented to person, place, and time.  Psychiatric:        Mood and Affect: Mood normal.        Behavior: Behavior normal.        Thought Content: Thought content normal.        Judgment: Judgment normal.          Patient has been counseled extensively about nutrition and exercise as well as the importance of  adherence with medications and regular follow-up. The patient was given clear instructions to go to ER or return to medical center if symptoms don't improve, worsen or new problems develop. The patient verbalized understanding.   Follow-up: Return in about 6 months (around 11/13/2023).   Burnard Bunting, FNP-BC  [facility] [place], [place] [phone]   05/16/2023, 11:42 AM  I have seen and examined this patient with the advanced practice provider student and agree with the above note .

## 2023-05-16 NOTE — Assessment & Plan Note (Signed)
Patient says she has not taken Lovaza and Atorvastatin in over a year. Lipid panel ordered. Medications will be ordered dependent on lab results.

## 2023-05-16 NOTE — Assessment & Plan Note (Addendum)
Patient reports dysmenorrhea and menorrhagia x 1 year. Pelvic tenderness on bimanual exam. Orders placed for pelvic ultrasound. Also ordered CBC w/ diff to evaluate for anemia

## 2023-05-16 NOTE — Assessment & Plan Note (Signed)
Pap and completed. Pelvic tenderness noted on exam.

## 2023-05-16 NOTE — Assessment & Plan Note (Signed)
Patient says she has not taken her levothyroxine in over a year. Thyroid panel w/ TSH ordered. Medication will be ordered based on lab result.

## 2023-05-16 NOTE — Assessment & Plan Note (Signed)
Patient previously pre-diabetic. Currently not taking any medications. Will obtain A1C.

## 2023-05-16 NOTE — Progress Notes (Signed)
Flu vaccine administered in LEFT deltoid per protocols.  Information sheet given. Patient denies and pain or discomfort at injection site. Tolerated injection well no reaction.

## 2023-05-17 LAB — CMP14+EGFR
ALT: 84 [IU]/L — ABNORMAL HIGH (ref 0–32)
AST: 50 [IU]/L — ABNORMAL HIGH (ref 0–40)
Albumin: 4.3 g/dL (ref 3.9–4.9)
Alkaline Phosphatase: 73 [IU]/L (ref 44–121)
BUN/Creatinine Ratio: 13 (ref 9–23)
BUN: 9 mg/dL (ref 6–24)
Bilirubin Total: 0.4 mg/dL (ref 0.0–1.2)
CO2: 19 mmol/L — ABNORMAL LOW (ref 20–29)
Calcium: 9 mg/dL (ref 8.7–10.2)
Chloride: 105 mmol/L (ref 96–106)
Creatinine, Ser: 0.67 mg/dL (ref 0.57–1.00)
Globulin, Total: 2.8 g/dL (ref 1.5–4.5)
Glucose: 93 mg/dL (ref 70–99)
Potassium: 4.3 mmol/L (ref 3.5–5.2)
Sodium: 137 mmol/L (ref 134–144)
Total Protein: 7.1 g/dL (ref 6.0–8.5)
eGFR: 110 mL/min/{1.73_m2} (ref >59)

## 2023-05-17 LAB — LIPID PANEL
Chol/HDL Ratio: 5 {ratio} — ABNORMAL HIGH (ref 0.0–4.4)
Cholesterol, Total: 232 mg/dL — ABNORMAL HIGH (ref 100–199)
HDL: 46 mg/dL (ref >39)
LDL Chol Calc (NIH): 148 mg/dL — ABNORMAL HIGH (ref 0–99)
Triglycerides: 209 mg/dL — ABNORMAL HIGH (ref 0–149)
VLDL Cholesterol Cal: 38 mg/dL (ref 5–40)

## 2023-05-17 LAB — CBC WITH DIFFERENTIAL/PLATELET
Basophils Absolute: 0 10*3/uL (ref 0.0–0.2)
Basos: 0 %
EOS (ABSOLUTE): 0.1 10*3/uL (ref 0.0–0.4)
Eos: 1 %
Hematocrit: 41.1 % (ref 34.0–46.6)
Hemoglobin: 13 g/dL (ref 11.1–15.9)
Immature Grans (Abs): 0 10*3/uL (ref 0.0–0.1)
Immature Granulocytes: 0 %
Lymphocytes Absolute: 3.1 10*3/uL (ref 0.7–3.1)
Lymphs: 37 %
MCH: 29.3 pg (ref 26.6–33.0)
MCHC: 31.6 g/dL (ref 31.5–35.7)
MCV: 93 fL (ref 79–97)
Monocytes Absolute: 0.6 10*3/uL (ref 0.1–0.9)
Monocytes: 8 %
Neutrophils Absolute: 4.6 10*3/uL (ref 1.4–7.0)
Neutrophils: 54 %
Platelets: 347 10*3/uL (ref 150–450)
RBC: 4.43 x10E6/uL (ref 3.77–5.28)
RDW: 13.9 % (ref 11.7–15.4)
WBC: 8.6 10*3/uL (ref 3.4–10.8)

## 2023-05-17 LAB — CERVICOVAGINAL ANCILLARY ONLY
Bacterial Vaginitis (gardnerella): NEGATIVE
Candida Glabrata: NEGATIVE
Candida Vaginitis: NEGATIVE
Chlamydia: NEGATIVE
Comment: NEGATIVE
Comment: NEGATIVE
Comment: NEGATIVE
Comment: NEGATIVE
Comment: NEGATIVE
Comment: NORMAL
Neisseria Gonorrhea: NEGATIVE
Trichomonas: NEGATIVE

## 2023-05-17 LAB — THYROID PANEL WITH TSH
Free Thyroxine Index: 1.1 — ABNORMAL LOW (ref 1.2–4.9)
T3 Uptake Ratio: 20 % — ABNORMAL LOW (ref 24–39)
T4, Total: 5.7 ug/dL (ref 4.5–12.0)
TSH: 3.88 u[IU]/mL (ref 0.450–4.500)

## 2023-05-17 LAB — HEMOGLOBIN A1C
Est. average glucose Bld gHb Est-mCnc: 131 mg/dL
Hgb A1c MFr Bld: 6.2 % — ABNORMAL HIGH (ref 4.8–5.6)

## 2023-05-17 NOTE — Progress Notes (Signed)
Scheduled

## 2023-05-19 LAB — CYTOLOGY - PAP
Chlamydia: NEGATIVE
Comment: NEGATIVE
Comment: NEGATIVE
Comment: NORMAL
Diagnosis: NEGATIVE
High risk HPV: NEGATIVE
Neisseria Gonorrhea: NEGATIVE

## 2023-05-24 ENCOUNTER — Telehealth: Payer: Self-pay | Admitting: *Deleted

## 2023-05-24 NOTE — Telephone Encounter (Signed)
Noted  

## 2023-05-24 NOTE — Telephone Encounter (Signed)
Pt given lab results per interpreter Margurite Auerbach ID # [id number] per notes of Z. Meredeth Ide, NP from 05/22/23 on 05/24/23. Pt verbalized understanding.

## 2023-05-25 ENCOUNTER — Other Ambulatory Visit: Payer: Self-pay

## 2023-05-26 ENCOUNTER — Ambulatory Visit (HOSPITAL_COMMUNITY)
Admission: RE | Admit: 2023-05-26 | Discharge: 2023-05-26 | Disposition: A | Discharge status: Home / Self Care | Payer: Self-pay | Source: Ambulatory Visit | Attending: Nurse Practitioner | Admitting: Nurse Practitioner

## 2023-05-26 DIAGNOSIS — N939 Abnormal uterine and vaginal bleeding, unspecified: Secondary | ICD-10-CM

## 2023-06-19 ENCOUNTER — Other Ambulatory Visit: Payer: Self-pay | Admitting: Nurse Practitioner

## 2023-06-19 DIAGNOSIS — N939 Abnormal uterine and vaginal bleeding, unspecified: Secondary | ICD-10-CM

## 2023-06-23 ENCOUNTER — Telehealth: Payer: Self-pay

## 2023-06-23 NOTE — Telephone Encounter (Signed)
I was speaking to the patient about her imaging results and she informed me that her financial assistance would be expiring 06/24/2023 and would like to renew.

## 2023-08-08 ENCOUNTER — Other Ambulatory Visit: Payer: Self-pay

## 2023-08-08 ENCOUNTER — Ambulatory Visit (INDEPENDENT_AMBULATORY_CARE_PROVIDER_SITE_OTHER): Payer: Self-pay | Admitting: Family Medicine

## 2023-08-08 ENCOUNTER — Encounter: Payer: Self-pay | Admitting: Family Medicine

## 2023-08-08 ENCOUNTER — Other Ambulatory Visit (HOSPITAL_COMMUNITY)
Admission: RE | Admit: 2023-08-08 | Discharge: 2023-08-08 | Disposition: A | Discharge status: Home / Self Care | Payer: Self-pay | Source: Ambulatory Visit | Attending: Family Medicine | Admitting: Family Medicine

## 2023-08-08 VITALS — BP 117/76 | HR 73 | Wt 190.7 lb

## 2023-08-08 DIAGNOSIS — N939 Abnormal uterine and vaginal bleeding, unspecified: Secondary | ICD-10-CM | POA: Insufficient documentation

## 2023-08-08 DIAGNOSIS — N83202 Unspecified ovarian cyst, left side: Secondary | ICD-10-CM

## 2023-08-08 DIAGNOSIS — N83209 Unspecified ovarian cyst, unspecified side: Secondary | ICD-10-CM | POA: Insufficient documentation

## 2023-08-08 MED ORDER — DOXYCYCLINE HYCLATE 100 MG PO TABS
100.0000 mg | ORAL_TABLET | Freq: Two times a day (BID) | ORAL | 0 refills | Status: DC
  Filled 2023-08-08: qty 20, 10d supply, fill #0

## 2023-08-08 NOTE — Assessment & Plan Note (Addendum)
EMB today given age to r/o endometrial hyperplasia or carcinoma. Most likely has adenomyosis with OB hx. Trial of doxy for presumptive treatment of chronic endometritis. Advised of treatment options to include TXA, progesterone with cycles or daily, IUD, endometrial ablation or hysterectomy.

## 2023-08-08 NOTE — Assessment & Plan Note (Signed)
Simple in nature and does not require f/u. She asked about removal, but would certainly repeat imaging prior to scheduled surgery.

## 2023-08-08 NOTE — Progress Notes (Signed)
   Subjective:    Patient ID: Courtney Edwards is a 47 y.o. female presenting with Menstrual Problem  on 08/08/2023  HPI: Patient is a [id number] with SVD x 5 including last baby was > 10 lb with SD. Reports heavy vaginal bleeding. She reports heavy vaginal bleeding x 8-9 days. Passing clots. Cycles are monthly.  Having pain on ovaries, on both sides. U/S done in 06/2023, showed simple ovarian cyst, no f/u needed. No uterine pathology noted with endometrial stripe of 7 mm..This has been going on for 2 years. Prior to this was still 8 days but not nearly has heavy. Has painful cycles.She takes Ibuprofen and heat which does help.  Review of Systems  Constitutional:  Negative for chills and fever.  Respiratory:  Negative for shortness of breath.   Cardiovascular:  Negative for chest pain.  Gastrointestinal:  Negative for abdominal pain, nausea and vomiting.  Genitourinary:  Negative for dysuria.  Skin:  Negative for rash.      Objective:    BP 117/76   Pulse 73   Wt 190 lb 11.2 oz (86.5 kg)   BMI 31.73 kg/m  Physical Exam Exam conducted with a chaperone present.  Constitutional:      General: She is not in acute distress.    Appearance: She is well-developed.  HENT:     Head: Normocephalic and atraumatic.  Eyes:     General: No scleral icterus. Cardiovascular:     Rate and Rhythm: Normal rate.  Pulmonary:     Effort: Pulmonary effort is normal.  Abdominal:     Palpations: Abdomen is soft.  Genitourinary:    Comments: BUS normal, vagina is pink and rugated, cervix is parous without lesion  Musculoskeletal:     Cervical back: Neck supple.  Skin:    General: Skin is warm and dry.  Neurological:     Mental Status: She is alert and oriented to person, place, and time.   Procedure: Patient given informed consent, signed copy in the chart, time out was performed. Appropriate time out taken. . The patient was placed in the lithotomy position and the cervix brought  into view with sterile speculum.  Portio of cervix cleansed x 2 with betadine swabs.  A tenaculum was placed in the anterior lip of the cervix.  The uterus was sounded for depth of 10 cm. A pipelle was introduced to into the uterus, suction created,  and an endometrial sample was obtained. All equipment was removed and accounted for.  The patient tolerated the procedure well.        Assessment & Plan:   Problem List Items Addressed This Visit       Unprioritized   Abnormal uterine bleeding (AUB) - Primary   EMB today given age to r/o endometrial hyperplasia or carcinoma. Most likely has adenomyosis with OB hx. Trial of doxy for presumptive treatment of chronic endometritis. Advised of treatment options to include TXA, progesterone with cycles or daily, IUD, endometrial ablation or hysterectomy.       Relevant Orders   Surgical pathology( [facility]/ [facility])   Ovarian cyst   Simple in nature and does not require f/u. She asked about removal, but would certainly repeat imaging prior to scheduled surgery.       Return in about 4 weeks (around 09/05/2023) for a follow-up.  Reva Bores, MD 08/08/2023 11:57 AM

## 2023-08-11 LAB — SURGICAL PATHOLOGY

## 2023-08-16 ENCOUNTER — Other Ambulatory Visit: Payer: Self-pay

## 2023-09-09 ENCOUNTER — Encounter: Payer: Self-pay | Admitting: Family Medicine

## 2023-09-09 ENCOUNTER — Ambulatory Visit: Payer: Self-pay | Admitting: Family Medicine

## 2023-09-09 ENCOUNTER — Other Ambulatory Visit: Payer: Self-pay

## 2023-09-09 VITALS — BP 110/71 | HR 77 | Wt 192.6 lb

## 2023-09-09 DIAGNOSIS — N939 Abnormal uterine and vaginal bleeding, unspecified: Secondary | ICD-10-CM

## 2023-09-09 MED ORDER — DOXYCYCLINE HYCLATE 100 MG PO TABS
100.0000 mg | ORAL_TABLET | Freq: Two times a day (BID) | ORAL | 0 refills | Status: DC
  Filled 2023-09-09: qty 20, 10d supply, fill #0

## 2023-09-09 NOTE — Progress Notes (Addendum)
   Subjective:    Patient ID: Courtney Edwards is a 48 y.o. female presenting with abnromal uterine bleeding  on 09/09/2023  HPI: Here for heavy vaginal bleeding. Has had heavy cycles. They last for 8 days. EMB and pelvic u/s were normal. Did not pick up rx for Abx.  Review of Systems  Constitutional:  Negative for chills and fever.  Respiratory:  Negative for shortness of breath.   Cardiovascular:  Negative for chest pain.  Gastrointestinal:  Negative for abdominal pain, nausea and vomiting.  Genitourinary:  Negative for dysuria.  Skin:  Negative for rash.      Objective:    BP 110/71   Pulse 77   Wt 192 lb 9.6 oz (87.4 kg)   LMP 08/28/2023   BMI 32.05 kg/m  Physical Exam Exam conducted with a chaperone present.  Constitutional:      General: She is not in acute distress.    Appearance: She is well-developed.  HENT:     Head: Normocephalic and atraumatic.  Eyes:     General: No scleral icterus. Cardiovascular:     Rate and Rhythm: Normal rate.  Pulmonary:     Effort: Pulmonary effort is normal.  Abdominal:     Palpations: Abdomen is soft.  Musculoskeletal:     Cervical back: Neck supple.  Skin:    General: Skin is warm and dry.  Neurological:     Mental Status: She is alert and oriented to person, place, and time.         Assessment & Plan:   Problem List Items Addressed This Visit       Unprioritized   Abnormal uterine bleeding (AUB) - Primary   Still most likely adenomyosis. She was offered TXA, Provera, IUD, surgical treatment options. She would like a trial of doxy for possible endometritis.       Relevant Medications   doxycycline (VIBRA-TABS) 100 MG tablet    Return in about 2 months (around 11/07/2023) for a follow-up.  Reva Bores, MD 09/09/2023 11:41 AM

## 2023-09-09 NOTE — Assessment & Plan Note (Signed)
Still most likely adenomyosis. She was offered TXA, Provera, IUD, surgical treatment options. She would like a trial of doxy for possible endometritis.

## 2023-10-27 ENCOUNTER — Other Ambulatory Visit: Payer: Self-pay

## 2023-10-27 ENCOUNTER — Ambulatory Visit (HOSPITAL_COMMUNITY)
Admission: EM | Admit: 2023-10-27 | Discharge: 2023-10-27 | Disposition: A | Discharge status: Home / Self Care | Payer: Self-pay | Attending: Family Medicine | Admitting: Family Medicine

## 2023-10-27 ENCOUNTER — Encounter (HOSPITAL_COMMUNITY): Payer: Self-pay | Admitting: Emergency Medicine

## 2023-10-27 DIAGNOSIS — H8110 Benign paroxysmal vertigo, unspecified ear: Secondary | ICD-10-CM

## 2023-10-27 DIAGNOSIS — R112 Nausea with vomiting, unspecified: Secondary | ICD-10-CM

## 2023-10-27 DIAGNOSIS — R109 Unspecified abdominal pain: Secondary | ICD-10-CM

## 2023-10-27 MED ORDER — ONDANSETRON HCL 4 MG/2ML IJ SOLN
INTRAMUSCULAR | Status: AC
  Filled 2023-10-27: qty 2

## 2023-10-27 MED ORDER — MECLIZINE HCL 25 MG PO TABS
25.0000 mg | ORAL_TABLET | Freq: Three times a day (TID) | ORAL | 0 refills | Status: DC | PRN
  Filled 2023-10-27: qty 30, 10d supply, fill #0

## 2023-10-27 MED ORDER — ONDANSETRON HCL 4 MG PO TABS
4.0000 mg | ORAL_TABLET | Freq: Four times a day (QID) | ORAL | 0 refills | Status: DC
  Filled 2023-10-27: qty 12, 3d supply, fill #0

## 2023-10-27 MED ORDER — ONDANSETRON HCL 4 MG/2ML IJ SOLN
4.0000 mg | Freq: Once | INTRAMUSCULAR | Status: AC
  Administered 2023-10-27: 4 mg via INTRAMUSCULAR

## 2023-10-27 NOTE — ED Triage Notes (Signed)
 Dizziness and nausea started last night.  Vomiting started this morning.  Epigastric area burns/hurts.  Complains of back of head is burning.  Patient states the room is spinning. Reports lying down makes symptoms worse.    Took a nausea pill and feels this made symptoms worse.

## 2023-10-27 NOTE — ED Provider Notes (Signed)
 [facility]    CSN: [id number] Arrival date & time: 10/27/23  1346      History   Chief Complaint No chief complaint on file.   HPI Courtney Edwards is a 48 y.o. female.   Patient is here with family member who is interpreting for her today.  Patient is here with dizziness and nausea that started last evening.  Room is spinning.  She states the back of her head feels hot, pressure.  She is having some epigastric pain as well.  Headache and dizziness is worse with movement from side to side.  This has not happened before.  No fevers/chills.  No URI symptoms noted.         Past Medical History:  Diagnosis Date   GERD (gastroesophageal reflux disease)    Hyperlipidemia    Hypothyroidism    Low blood pressure    Medical history non-contributory    Mental disorder    Post partum depression 2002    Patient Active Problem List   Diagnosis Date Noted   Ovarian cyst 08/08/2023   Abnormal uterine bleeding (AUB) 05/16/2023   Abnormal CBC 05/16/2023   Pterygium of left eye 03/01/2018   Chronic left shoulder pain 04/19/2017   Chronic back pain greater than 3 months duration 04/19/2017   Dyslipidemia 04/19/2017   Hypothyroidism 04/07/2017   HLD (hyperlipidemia) 02/06/2015   Dyspareunia 05/20/2014   Constipation 08/06/2013   Onychomycosis 08/06/2013   Hemorrhoid 08/06/2013   GERD (gastroesophageal reflux disease) 08/06/2013   Muscle ache 08/06/2013   LOW BACK PAIN, MILD 03/02/2007   Other specified abnormal findings of blood chemistry 11/03/2006    Past Surgical History:  Procedure Laterality Date   LAPAROSCOPY N/A 02/09/2017   Procedure: LAPAROSCOPY DIAGNOSTIC;  Surgeon: Allie Bossier, MD;  Location: [facility];  Service: Gynecology;  Laterality: N/A;   NO PAST SURGERIES      OB History     Gravida  5   Para  5   Term  5   Preterm  0   AB  0   Living  5      SAB  0   IAB  0   Ectopic      Multiple  0   Live Births  1             Home Medications    Prior to Admission medications   Medication Sig Start Date End Date Taking? Authorizing Provider  atorvastatin (LIPITOR) 40 MG tablet Take 1 tablet (40 mg total) by mouth daily. FOR CHOLESTEROL Patient not taking: Reported on 05/16/2023 05/16/23   Claiborne Rigg, NP  doxycycline (VIBRA-TABS) 100 MG tablet Take 1 tablet (100 mg total) by mouth 2 (two) times daily. 09/09/23   Reva Bores, MD  levothyroxine (SYNTHROID) 50 MCG tablet Take 1 tablet (50 mcg total) by mouth daily before breakfast. Patient not taking: Reported on 05/16/2023 05/16/23   Claiborne Rigg, NP  omega-3 acid ethyl esters (LOVAZA) 1 g capsule Take 2 capsules (2 g total) by mouth 2 (two) times daily. FOR cholesterol Patient not taking: Reported on 05/16/2023 05/16/23   Claiborne Rigg, NP    Family History Family History  Problem Relation Age of Onset   Hypertension Mother    Diabetes Mother    Heart disease Mother    Colon cancer Neg Hx    Pancreatic cancer Neg Hx    Esophageal cancer Neg Hx    Rectal cancer Neg  Hx    Stomach cancer Neg Hx    Liver cancer Neg Hx     Social History Social History   Tobacco Use   Smoking status: Never   Smokeless tobacco: Never  Vaping Use   Vaping status: Never Used  Substance Use Topics   Alcohol use: No   Drug use: No     Allergies   Patient has no known allergies.   Review of Systems Review of Systems  Constitutional:  Positive for fatigue.  Respiratory: Negative.    Cardiovascular: Negative.   Gastrointestinal:  Positive for abdominal pain, nausea and vomiting.  Genitourinary: Negative.   Musculoskeletal: Negative.   Neurological:  Positive for dizziness.  Psychiatric/Behavioral: Negative.       Physical Exam Triage Vital Signs ED Triage Vitals  Encounter Vitals Group     BP 10/27/23 1440 (!) 112/59     Systolic BP Percentile --      Diastolic BP Percentile --      Pulse Rate 10/27/23 1440 60     Resp 10/27/23  1440 20     Temp 10/27/23 1440 98.1 F (36.7 C)     Temp Source 10/27/23 1440 Oral     SpO2 10/27/23 1440 94 %     Weight --      Height --      Head Circumference --      Peak Flow --      Pain Score 10/27/23 1438 6     Pain Loc --      Pain Education --      Exclude from Growth Chart --    No data found.  Updated Vital Signs BP (!) 112/59 (BP Location: Right Arm)   Pulse 60   Temp 98.1 F (36.7 C) (Oral)   Resp 20   LMP 10/23/2023 (Approximate)   SpO2 94%   Visual Acuity Right Eye Distance:   Left Eye Distance:   Bilateral Distance:    Right Eye Near:   Left Eye Near:    Bilateral Near:     Physical Exam Constitutional:      General: She is not in acute distress.    Appearance: Normal appearance. She is normal weight.  HENT:     Right Ear: Hearing and tympanic membrane normal.     Left Ear: Hearing and tympanic membrane normal.     Nose: Nose normal.  Eyes:     General: Lids are normal.     Extraocular Movements: Extraocular movements intact.     Conjunctiva/sclera: Conjunctivae normal.     Pupils: Pupils are equal, round, and reactive to light.  Cardiovascular:     Rate and Rhythm: Normal rate and regular rhythm.  Pulmonary:     Effort: Pulmonary effort is normal.     Breath sounds: Normal breath sounds.  Abdominal:     Palpations: Abdomen is soft.     Tenderness: There is abdominal tenderness.     Comments: Epigastric tenderness;  slight TTP to the left mid abdomen;  no guarding or rebound  Musculoskeletal:     Cervical back: Normal range of motion and neck supple.  Skin:    General: Skin is warm.  Neurological:     General: No focal deficit present.     Mental Status: She is alert and oriented to person, place, and time.     Cranial Nerves: No cranial nerve deficit.     Sensory: No sensory deficit.     Motor: No weakness.  Coordination: Coordination is intact.     Comments: Dix halpike positive bilaterally  Psychiatric:        Mood and  Affect: Mood normal.        Behavior: Behavior normal.      UC Treatments / Results  Labs (all labs ordered are listed, but only abnormal results are displayed) Labs Reviewed - No data to display  EKG   Radiology No results found.  Procedures Procedures (including critical care time)  Medications Ordered in UC Medications  ondansetron (ZOFRAN) injection 4 mg (has no administration in time range)    Initial Impression / Assessment and Plan / UC Course  I have reviewed the triage vital signs and the nursing notes.  Pertinent labs & imaging results that were available during my care of the patient were reviewed by me and considered in my medical decision making (see chart for details).  Final Clinical Impressions(s) / UC Diagnoses   Final diagnoses:  Benign paroxysmal positional vertigo, unspecified laterality  Abdominal pain, unspecified abdominal location  Nausea and vomiting, unspecified vomiting type     Discharge Instructions      La atendieron hoy por mareos, nuseas y dolor abdominal. Sus sntomas son ms consistentes con vrtigo. Hoy les he dado informacin al [employer]. Le he administrado una inyeccin de [employer] para las nuseas mientras estuvo aqu y he enviado a Garment/textile technologist un medicamento oral para las nuseas y un medicamento para Transport planner. Por favor, descanse bien y aumente la ingesta de lquidos. Beba pequeas cantidades cada vez para mantenerlos en el estmago. Si sus sntomas no mejoran o empeoran a pesar del tratamiento, acuda a urgencias para una evaluacin ms exhaustiva.  She was seen today for dizziness, nausea, and abdominal pain.  Her symptoms are most consistent with vertigo.  I have given you information on this today.  I have given her a shot of a nausea medication while here, and have sent oral nausea medication and a medication for dizziness to the pharmacy.  Please get plenty of rest and increase fluids.  Drink a small amount at a time to  keep it down.  If her symptoms are not improving or worsening despite treatment, then please go the the ER for further evaluatin.     ED Prescriptions     Medication Sig Dispense Auth. Provider   ondansetron (ZOFRAN) 4 MG tablet Take 1 tablet (4 mg total) by mouth every 6 (six) hours. 12 tablet Nicholas Ossa, MD   meclizine (ANTIVERT) 25 MG tablet Take 1 tablet (25 mg total) by mouth 3 (three) times daily as needed for dizziness. 30 tablet Jannifer Franklin, MD      PDMP not reviewed this encounter.   Jannifer Franklin, MD 10/27/23 [phone]

## 2023-10-27 NOTE — ED Notes (Signed)
 Reviewed work note

## 2023-10-27 NOTE — Discharge Instructions (Addendum)
 La atendieron hoy por [employer], nuseas y dolor abdominal. Sus sntomas son ms consistentes con vrtigo. Hoy les he dado informacin al [employer]. Le he administrado una inyeccin de [employer] para las nuseas mientras estuvo aqu y he enviado a Garment/textile technologist un medicamento oral para las nuseas y un medicamento para Transport planner. Por favor, descanse bien y aumente la ingesta de lquidos. Beba pequeas cantidades cada vez para mantenerlos en el estmago. Si sus sntomas no mejoran o empeoran a pesar del tratamiento, acuda a urgencias para una evaluacin ms exhaustiva.  She was seen today for dizziness, nausea, and abdominal pain.  Her symptoms are most consistent with vertigo.  I have given you information on this today.  I have given her a shot of a nausea medication while here, and have sent oral nausea medication and a medication for dizziness to the pharmacy.  Please get plenty of rest and increase fluids.  Drink a small amount at a time to keep it down.  If her symptoms are not improving or worsening despite treatment, then please go the the ER for further evaluatin.

## 2023-11-10 ENCOUNTER — Other Ambulatory Visit (HOSPITAL_COMMUNITY)
Admission: RE | Admit: 2023-11-10 | Discharge: 2023-11-10 | Disposition: A | Discharge status: Home / Self Care | Payer: Self-pay | Source: Ambulatory Visit | Attending: Family Medicine | Admitting: Family Medicine

## 2023-11-10 ENCOUNTER — Ambulatory Visit (INDEPENDENT_AMBULATORY_CARE_PROVIDER_SITE_OTHER): Payer: Self-pay | Admitting: Family Medicine

## 2023-11-10 ENCOUNTER — Encounter: Payer: Self-pay | Admitting: Family Medicine

## 2023-11-10 ENCOUNTER — Other Ambulatory Visit: Payer: Self-pay

## 2023-11-10 VITALS — BP 110/73 | HR 68 | Wt 196.0 lb

## 2023-11-10 DIAGNOSIS — N939 Abnormal uterine and vaginal bleeding, unspecified: Secondary | ICD-10-CM

## 2023-11-10 DIAGNOSIS — N760 Acute vaginitis: Secondary | ICD-10-CM | POA: Insufficient documentation

## 2023-11-10 MED ORDER — FLUCONAZOLE 150 MG PO TABS
150.0000 mg | ORAL_TABLET | Freq: Every day | ORAL | 2 refills | Status: DC
  Filled 2023-11-10: qty 2, 2d supply, fill #0

## 2023-11-10 MED ORDER — NORETHINDRONE ACETATE 5 MG PO TABS
5.0000 mg | ORAL_TABLET | Freq: Every day | ORAL | 3 refills | Status: DC
  Filled 2023-11-10: qty 30, 30d supply, fill #0
  Filled 2023-11-29: qty 30, 30d supply, fill #1
  Filled 2024-01-12: qty 30, 30d supply, fill #2
  Filled 2024-03-21: qty 30, 30d supply, fill #3

## 2023-11-10 NOTE — Assessment & Plan Note (Signed)
 Previously discussed multiple options with pt. Repeated this conversation today. Available options are progesterone, IUD, TXA, endometrial ablation, hysterectomy. Reviewed R/B/A of each. She is most interested in ablation. Will attempt some Aygestin first and let me know if wants to pursue further.

## 2023-11-10 NOTE — Progress Notes (Signed)
   Subjective:  Spanish interpreter: Eda used   Patient ID: Courtney Edwards is a 48 y.o. female presenting with Follow-up  on 11/10/2023  HPI: Placed on course of Abx as possible treatment for AUB. Now has vaginal itching and some burning with urination. Bleeding continues. Normal appearing uterus on u/s. Normal labs. Normal EMB. Suspect adenomyosis due to G5P5005    Review of Systems  Constitutional:  Negative for chills and fever.  Respiratory:  Negative for shortness of breath.   Cardiovascular:  Negative for chest pain.  Gastrointestinal:  Negative for abdominal pain, nausea and vomiting.  Genitourinary:  Positive for vaginal discharge. Negative for dysuria.  Skin:  Negative for rash.      Objective:    BP 110/73   Pulse 68   Wt 196 lb (88.9 kg)   LMP 10/23/2023 (Approximate)   BMI 32.62 kg/m  Physical Exam Exam conducted with a chaperone present.  Constitutional:      General: She is not in acute distress.    Appearance: She is well-developed.  HENT:     Head: Normocephalic and atraumatic.  Eyes:     General: No scleral icterus. Cardiovascular:     Rate and Rhythm: Normal rate.  Pulmonary:     Effort: Pulmonary effort is normal.  Abdominal:     Palpations: Abdomen is soft.  Musculoskeletal:     Cervical back: Neck supple.  Skin:    General: Skin is warm and dry.  Neurological:     Mental Status: She is alert and oriented to person, place, and time.         Assessment & Plan:   Problem List Items Addressed This Visit       Unprioritized   Abnormal uterine bleeding (AUB)   Previously discussed multiple options with pt. Repeated this conversation today. Available options are progesterone, IUD, TXA, endometrial ablation, hysterectomy. Reviewed R/B/A of each. She is most interested in ablation. Will attempt some Aygestin first and let me know if wants to pursue further.      Relevant Medications   norethindrone (AYGESTIN) 5 MG tablet    Other Visit Diagnoses       Acute vaginitis    -  Primary   treat with Diflucan and check wet prep for anything else.   Relevant Medications   fluconazole (DIFLUCAN) 150 MG tablet   Other Relevant Orders   Cervicovaginal ancillary only( [facility])       Return in about 4 weeks (around 12/08/2023).  Reva Bores, MD 11/10/2023 10:29 AM

## 2023-11-14 ENCOUNTER — Other Ambulatory Visit: Payer: Self-pay

## 2023-11-14 ENCOUNTER — Encounter: Payer: Self-pay | Admitting: Nurse Practitioner

## 2023-11-14 ENCOUNTER — Ambulatory Visit: Payer: Self-pay | Attending: Nurse Practitioner | Admitting: Nurse Practitioner

## 2023-11-14 VITALS — BP 104/66 | HR 67 | Resp 17 | Ht 65.0 in | Wt 194.6 lb

## 2023-11-14 DIAGNOSIS — E782 Mixed hyperlipidemia: Secondary | ICD-10-CM

## 2023-11-14 DIAGNOSIS — E039 Hypothyroidism, unspecified: Secondary | ICD-10-CM

## 2023-11-14 DIAGNOSIS — R7303 Prediabetes: Secondary | ICD-10-CM

## 2023-11-14 LAB — CERVICOVAGINAL ANCILLARY ONLY
Bacterial Vaginitis (gardnerella): NEGATIVE
Candida Glabrata: POSITIVE — AB
Candida Vaginitis: POSITIVE — AB
Chlamydia: NEGATIVE
Comment: NEGATIVE
Comment: NEGATIVE
Comment: NEGATIVE
Comment: NEGATIVE
Comment: NEGATIVE
Comment: NORMAL
Neisseria Gonorrhea: NEGATIVE
Trichomonas: NEGATIVE

## 2023-11-14 LAB — POCT GLYCOSYLATED HEMOGLOBIN (HGB A1C): HbA1c, POC (prediabetic range): 5.9 % (ref 5.7–6.4)

## 2023-11-14 MED ORDER — OMEGA-3-ACID ETHYL ESTERS 1 G PO CAPS
2.0000 g | ORAL_CAPSULE | Freq: Two times a day (BID) | ORAL | 3 refills | Status: AC
  Filled 2023-11-14: qty 120, 30d supply, fill #0

## 2023-11-14 MED ORDER — ATORVASTATIN CALCIUM 40 MG PO TABS
40.0000 mg | ORAL_TABLET | Freq: Every day | ORAL | 3 refills | Status: AC
  Filled 2023-11-14: qty 90, 90d supply, fill #0

## 2023-11-14 MED ORDER — LEVOTHYROXINE SODIUM 50 MCG PO TABS
50.0000 ug | ORAL_TABLET | Freq: Every day | ORAL | 1 refills | Status: DC
  Filled 2023-11-14 (×4): qty 90, 90d supply, fill #0

## 2023-11-14 NOTE — Progress Notes (Signed)
 Assessment & Plan:  Courtney Edwards was seen today for medical management of chronic issues.  Diagnoses and all orders for this visit:  Prediabetes -     POCT glycosylated hemoglobin (Hb A1C) -     CMP14+EGFR  Hypothyroidism, unspecified type -     Thyroid Panel With TSH -     levothyroxine (SYNTHROID) 50 MCG tablet; Take 1 tablet (50 mcg total) by mouth daily before breakfast. FOR THYROID  Mixed hyperlipidemia -     atorvastatin (LIPITOR) 40 MG tablet; Take 1 tablet (40 mg total) by mouth daily. FOR CHOLESTEROL INSTRUCTIONS: Work on a low fat, heart healthy diet and participate in regular aerobic exercise program by working out at least 150 minutes per week; 5 days a week-30 minutes per day. Avoid red meat/beef/steak,  fried foods. junk foods, sodas, sugary drinks, unhealthy snacking, alcohol and smoking.  Drink at least 80 oz of water per day and monitor your carbohydrate intake daily.   -     omega-3 acid ethyl esters (LOVAZA) 1 g capsule; Take 2 capsules (2 g total) by mouth 2 (two) times daily. FOR cholesterol INSTRUCTIONS: Work on a low fat, heart healthy diet and participate in regular aerobic exercise program by working out at least 150 minutes per week; 5 days a week-30 minutes per day. Avoid red meat/beef/steak,  fried foods. junk foods, sodas, sugary drinks, unhealthy snacking, alcohol and smoking.  Drink at least 80 oz of water per day and monitor your carbohydrate intake daily.      Patient has been counseled on age-appropriate routine health concerns for screening and prevention. These are reviewed and up-to-date. Referrals have been placed accordingly. Immunizations are up-to-date or declined.    Subjective:   Chief Complaint  Patient presents with   Medical Management of Chronic Issues    Sloka Volante 48 y.o. female presents to office today for follow up to prediabetes  VRI was used to communicate directly with patient for the entire encounter including  providing detailed patient instructions.     Prediabetes A1c has improved and at goal of <6.5. Currently controlled with diet only.  Lab Results  Component Value Date   HGBA1C 5.9 11/14/2023    Lab Results  Component Value Date   HGBA1C 6.2 (H) 05/16/2023    She is requesting hormonal testing today to make sure her hormones are "good"  She has been experiencing AUB for which she is being followed by Gynecology.   Hypothyroidism She is not taking her levothyroxine for several months. States she was told by the pharmacy when she went to pick it up that it was not ready. She never went back to pick up Lab Results  Component Value Date   TSH 3.880 05/16/2023   T4TOTAL 5.7 05/16/2023     Review of Systems  Constitutional:  Negative for fever, malaise/fatigue and weight loss.  HENT: Negative.  Negative for nosebleeds.   Eyes: Negative.  Negative for blurred vision, double vision and photophobia.  Respiratory: Negative.  Negative for cough and shortness of breath.   Cardiovascular: Negative.  Negative for chest pain, palpitations and leg swelling.  Gastrointestinal: Negative.  Negative for heartburn, nausea and vomiting.  Musculoskeletal: Negative.  Negative for myalgias.  Neurological: Negative.  Negative for dizziness, focal weakness, seizures and headaches.  Psychiatric/Behavioral: Negative.  Negative for suicidal ideas.     Past Medical History:  Diagnosis Date   GERD (gastroesophageal reflux disease)    Hyperlipidemia    Hypothyroidism  Low blood pressure    Medical history non-contributory    Mental disorder    Post partum depression 2002    Past Surgical History:  Procedure Laterality Date   LAPAROSCOPY N/A 02/09/2017   Procedure: LAPAROSCOPY DIAGNOSTIC;  Surgeon: Allie Bossier, MD;  Location: [facility];  Service: Gynecology;  Laterality: N/A;   NO PAST SURGERIES      Family History  Problem Relation Age of Onset   Hypertension Mother    Diabetes Mother    Heart  disease Mother    Colon cancer Neg Hx    Pancreatic cancer Neg Hx    Esophageal cancer Neg Hx    Rectal cancer Neg Hx    Stomach cancer Neg Hx    Liver cancer Neg Hx     Social History Reviewed with no changes to be made today.   Outpatient Medications Prior to Visit  Medication Sig Dispense Refill   meclizine (ANTIVERT) 25 MG tablet Take 1 tablet (25 mg total) by mouth 3 (three) times daily as needed for dizziness. 30 tablet 0   norethindrone (AYGESTIN) 5 MG tablet Take 1 tablet (5 mg total) by mouth daily. 30 tablet 3   ondansetron (ZOFRAN) 4 MG tablet Take 1 tablet (4 mg total) by mouth every 6 (six) hours. 12 tablet 0   atorvastatin (LIPITOR) 40 MG tablet Take 1 tablet (40 mg total) by mouth daily. FOR CHOLESTEROL 90 tablet 3   levothyroxine (SYNTHROID) 50 MCG tablet Take 1 tablet (50 mcg total) by mouth daily before breakfast. 90 tablet 1   omega-3 acid ethyl esters (LOVAZA) 1 g capsule Take 2 capsules (2 g total) by mouth 2 (two) times daily. FOR cholesterol 120 capsule 3   fluconazole (DIFLUCAN) 150 MG tablet Take 1 tablet (150 mg total) by mouth daily. Repeat in 24 hours if needed (Patient not taking: Reported on 11/14/2023) 2 tablet 2   No facility-administered medications prior to visit.    No Known Allergies     Objective:    BP 104/66 (BP Location: Left Arm, Patient Position: Sitting, Cuff Size: Normal)   Pulse 67   Resp 17   Ht 5\' 5"  (1.651 m)   Wt 194 lb 9.6 oz (88.3 kg)   LMP 10/23/2023 (Approximate)   SpO2 99%   BMI 32.38 kg/m  Wt Readings from Last 3 Encounters:  11/14/23 194 lb 9.6 oz (88.3 kg)  11/10/23 196 lb (88.9 kg)  09/09/23 192 lb 9.6 oz (87.4 kg)    Physical Exam Vitals and nursing note reviewed.  Constitutional:      Appearance: She is well-developed.  HENT:     Head: Normocephalic and atraumatic.  Cardiovascular:     Rate and Rhythm: Normal rate and regular rhythm.     Heart sounds: Normal heart sounds. No murmur heard.    No friction  rub. No gallop.  Pulmonary:     Effort: Pulmonary effort is normal. No tachypnea or respiratory distress.     Breath sounds: Normal breath sounds. No decreased breath sounds, wheezing, rhonchi or rales.  Chest:     Chest wall: No tenderness.  Abdominal:     General: Bowel sounds are normal.     Palpations: Abdomen is soft.  Musculoskeletal:        General: Normal range of motion.     Cervical back: Normal range of motion.  Skin:    General: Skin is warm and dry.  Neurological:     Mental Status: She is  alert and oriented to person, place, and time.     Coordination: Coordination normal.  Psychiatric:        Behavior: Behavior normal. Behavior is cooperative.        Thought Content: Thought content normal.        Judgment: Judgment normal.          Patient has been counseled extensively about nutrition and exercise as well as the importance of adherence with medications and regular follow-up. The patient was given clear instructions to go to ER or return to medical center if symptoms don't improve, worsen or new problems develop. The patient verbalized understanding.   Follow-up: Return in about 3 months (around 02/13/2024).   Claiborne Rigg, FNP-BC [facility] and [facility] [place], [place] [phone]   11/14/2023, 10:24 AM

## 2023-11-15 LAB — CMP14+EGFR
ALT: 53 IU/L — ABNORMAL HIGH (ref 0–32)
AST: 33 IU/L (ref 0–40)
Albumin: 4.3 g/dL (ref 3.9–4.9)
Alkaline Phosphatase: 71 IU/L (ref 44–121)
BUN/Creatinine Ratio: 7 — ABNORMAL LOW (ref 9–23)
BUN: 5 mg/dL — ABNORMAL LOW (ref 6–24)
Bilirubin Total: 0.4 mg/dL (ref 0.0–1.2)
CO2: 20 mmol/L (ref 20–29)
Calcium: 9.4 mg/dL (ref 8.7–10.2)
Chloride: 106 mmol/L (ref 96–106)
Creatinine, Ser: 0.75 mg/dL (ref 0.57–1.00)
Globulin, Total: 3.2 g/dL (ref 1.5–4.5)
Glucose: 103 mg/dL — ABNORMAL HIGH (ref 70–99)
Potassium: 4.4 mmol/L (ref 3.5–5.2)
Sodium: 138 mmol/L (ref 134–144)
Total Protein: 7.5 g/dL (ref 6.0–8.5)
eGFR: 100 mL/min/{1.73_m2} (ref >59)

## 2023-11-15 LAB — LIPID PANEL
Chol/HDL Ratio: 5.5 ratio — ABNORMAL HIGH (ref 0.0–4.4)
Cholesterol, Total: 227 mg/dL — ABNORMAL HIGH (ref 100–199)
HDL: 41 mg/dL (ref >39)
LDL Chol Calc (NIH): 163 mg/dL — ABNORMAL HIGH (ref 0–99)
Triglycerides: 125 mg/dL (ref 0–149)
VLDL Cholesterol Cal: 23 mg/dL (ref 5–40)

## 2023-11-15 LAB — THYROID PANEL WITH TSH
Free Thyroxine Index: 1.2 (ref 1.2–4.9)
T3 Uptake Ratio: 21 % — ABNORMAL LOW (ref 24–39)
T4, Total: 5.6 ug/dL (ref 4.5–12.0)
TSH: 3.05 u[IU]/mL (ref 0.450–4.500)

## 2023-11-16 ENCOUNTER — Encounter: Payer: Self-pay | Admitting: Nurse Practitioner

## 2023-11-29 ENCOUNTER — Other Ambulatory Visit: Payer: Self-pay

## 2024-01-12 ENCOUNTER — Other Ambulatory Visit: Payer: Self-pay

## 2024-02-13 ENCOUNTER — Ambulatory Visit: Payer: Self-pay | Admitting: Nurse Practitioner

## 2024-03-15 ENCOUNTER — Telehealth: Payer: Self-pay | Admitting: Nurse Practitioner

## 2024-03-15 NOTE — Telephone Encounter (Addendum)
 Called patient to confirm upcoming appointment 03/16/2024 at 10:50 am. Patient appointment has been successfully confirmed

## 2024-03-16 ENCOUNTER — Ambulatory Visit: Payer: Self-pay | Attending: Nurse Practitioner | Admitting: Nurse Practitioner

## 2024-03-16 ENCOUNTER — Encounter: Payer: Self-pay | Admitting: Nurse Practitioner

## 2024-03-16 VITALS — BP 109/71 | HR 59 | Resp 18 | Ht 65.0 in | Wt 194.2 lb

## 2024-03-16 DIAGNOSIS — E039 Hypothyroidism, unspecified: Secondary | ICD-10-CM

## 2024-03-16 NOTE — Patient Instructions (Signed)
Placed in [facility]  [address]  [place], [place] [address] [phone]

## 2024-03-16 NOTE — Progress Notes (Signed)
 Assessment & Plan:  Courtney Edwards was seen today for hypothyroidism.  Diagnoses and all orders for this visit:  Acquired hypothyroidism -     Thyroid  Panel With TSH    Patient has been counseled on age-appropriate routine health concerns for screening and prevention. These are reviewed and up-to-date. Referrals have been placed accordingly. Immunizations are up-to-date or declined.    Subjective:   Chief Complaint  Patient presents with   Hypothyroidism    Courtney Edwards 48 y.o. female presents to office for follow-up to hypothyroidism  VRI was used to communicate directly with patient for the entire encounter including providing detailed patient instructions.     She is requesting parasite medication today.  States sometimes she feels as if something is crawling in her nose.   Hypothyroidism Thyroid  level is normal at this time.  She is taking levothyroxine  50 mg daily.  Denies any symptoms of hypo or hyperthyroidism Lab Results  Component Value Date   TSH 3.050 11/14/2023     AUB She has a history of heavy painful menstrual cycles.  She has been evaluated by gynecology for this in the past and was prescribed Aygestin  for 3 months and was instructed to return to discuss plans for possible ablation.  Today she states her menstrual cycles are much lighter since she has been taking Aygestin .  She ran out 8 days ago and is requesting a refill.  I have instructed her that she needs to follow-up with gynecology for other recommendations as Aygestin  is not meant to be considered as a long-term option.      Review of Systems  Constitutional:  Negative for fever, malaise/fatigue and weight loss.  HENT: Negative.  Negative for nosebleeds.   Eyes: Negative.  Negative for blurred vision, double vision and photophobia.  Respiratory: Negative.  Negative for cough and shortness of breath.   Cardiovascular: Negative.  Negative for chest pain, palpitations and leg swelling.   Gastrointestinal: Negative.  Negative for heartburn, nausea and vomiting.  Genitourinary:        See HPI  Musculoskeletal: Negative.  Negative for myalgias.  Neurological: Negative.  Negative for dizziness, focal weakness, seizures and headaches.  Psychiatric/Behavioral: Negative.  Negative for suicidal ideas.     Past Medical History:  Diagnosis Date   GERD (gastroesophageal reflux disease)    Hyperlipidemia    Hypothyroidism    Low blood pressure    Medical history non-contributory    Mental disorder    Post partum depression 2002    Past Surgical History:  Procedure Laterality Date   LAPAROSCOPY N/A 02/09/2017   Procedure: LAPAROSCOPY DIAGNOSTIC;  Surgeon: Starla Harland BROCKS, MD;  Location: [facility];  Service: Gynecology;  Laterality: N/A;   NO PAST SURGERIES      Family History  Problem Relation Age of Onset   Hypertension Mother    Diabetes Mother    Heart disease Mother    Colon cancer Neg Hx    Pancreatic cancer Neg Hx    Esophageal cancer Neg Hx    Rectal cancer Neg Hx    Stomach cancer Neg Hx    Liver cancer Neg Hx     Social History Reviewed with no changes to be made today.   Outpatient Medications Prior to Visit  Medication Sig Dispense Refill   atorvastatin  (LIPITOR) 40 MG tablet Take 1 tablet (40 mg total) by mouth daily. FOR CHOLESTEROL 90 tablet 3   levothyroxine  (SYNTHROID ) 50 MCG tablet Take 1 tablet (50 mcg  total) by mouth daily before breakfast. FOR THYROID  90 tablet 1   omega-3 acid ethyl esters (LOVAZA ) 1 g capsule Take 2 capsules (2 g total) by mouth 2 (two) times daily. FOR cholesterol 120 capsule 3   norethindrone  (AYGESTIN ) 5 MG tablet Take 1 tablet (5 mg total) by mouth daily. (Patient not taking: Reported on 03/16/2024) 30 tablet 3   meclizine  (ANTIVERT ) 25 MG tablet Take 1 tablet (25 mg total) by mouth 3 (three) times daily as needed for dizziness. (Patient not taking: Reported on 03/16/2024) 30 tablet 0   ondansetron  (ZOFRAN ) 4 MG tablet Take 1  tablet (4 mg total) by mouth every 6 (six) hours. (Patient not taking: Reported on 03/16/2024) 12 tablet 0   No facility-administered medications prior to visit.    No Known Allergies     Objective:    BP 109/71 (BP Location: Left Arm, Patient Position: Sitting, Cuff Size: Normal)   Pulse (!) 59   Resp 18   Ht 5' 5 (1.651 m)   Wt 194 lb 3.2 oz (88.1 kg)   LMP  (LMP Unknown)   SpO2 99%   BMI 32.32 kg/m  Wt Readings from Last 3 Encounters:  03/16/24 194 lb 3.2 oz (88.1 kg)  11/14/23 194 lb 9.6 oz (88.3 kg)  11/10/23 196 lb (88.9 kg)    Physical Exam Vitals and nursing note reviewed.  Constitutional:      Appearance: She is well-developed.  HENT:     Head: Normocephalic and atraumatic.  Cardiovascular:     Rate and Rhythm: Regular rhythm. Bradycardia present.     Heart sounds: Normal heart sounds. No murmur heard.    No friction rub. No gallop.  Pulmonary:     Effort: Pulmonary effort is normal. No tachypnea or respiratory distress.     Breath sounds: Normal breath sounds. No decreased breath sounds, wheezing, rhonchi or rales.  Chest:     Chest wall: No tenderness.  Abdominal:     General: Bowel sounds are normal.     Palpations: Abdomen is soft.  Musculoskeletal:        General: Normal range of motion.     Cervical back: Normal range of motion.  Skin:    General: Skin is warm and dry.  Neurological:     Mental Status: She is alert and oriented to person, place, and time.     Coordination: Coordination normal.  Psychiatric:        Behavior: Behavior normal. Behavior is cooperative.        Thought Content: Thought content normal.        Judgment: Judgment normal.          Patient has been counseled extensively about nutrition and exercise as well as the importance of adherence with medications and regular follow-up. The patient was given clear instructions to go to ER or return to medical center if symptoms don't improve, worsen or new problems develop. The  patient verbalized understanding.   Follow-up: Return in about 6 months (around 09/16/2024).   Haze LELON Servant, FNP-BC [facility] [place], [place] [phone]   03/16/2024, 12:22 PM

## 2024-03-17 LAB — THYROID PANEL WITH TSH
Free Thyroxine Index: 1.4 (ref 1.2–4.9)
T3 Uptake Ratio: 25 % (ref 24–39)
T4, Total: 5.7 ug/dL (ref 4.5–12.0)
TSH: 3.31 u[IU]/mL (ref 0.450–4.500)

## 2024-03-20 ENCOUNTER — Ambulatory Visit: Payer: Self-pay | Admitting: Nurse Practitioner

## 2024-03-20 DIAGNOSIS — E039 Hypothyroidism, unspecified: Secondary | ICD-10-CM

## 2024-03-20 MED ORDER — LEVOTHYROXINE SODIUM 50 MCG PO TABS
50.0000 ug | ORAL_TABLET | Freq: Every day | ORAL | 1 refills | Status: AC
  Filled 2024-03-20: qty 90, 90d supply, fill #0

## 2024-03-21 ENCOUNTER — Other Ambulatory Visit: Payer: Self-pay

## 2024-03-28 ENCOUNTER — Other Ambulatory Visit: Payer: Self-pay

## 2024-03-29 ENCOUNTER — Other Ambulatory Visit: Payer: Self-pay

## 2024-04-23 ENCOUNTER — Telehealth: Payer: Self-pay | Admitting: Family Medicine

## 2024-04-23 NOTE — Telephone Encounter (Signed)
 Patient is ready for her next step that her and Dr. Fredirick had dicussed about in her visit, Her insurance expires the beginning of Dec and would like done while she has insurance.

## 2024-04-30 ENCOUNTER — Ambulatory Visit (INDEPENDENT_AMBULATORY_CARE_PROVIDER_SITE_OTHER): Payer: Self-pay | Admitting: Family Medicine

## 2024-04-30 ENCOUNTER — Other Ambulatory Visit: Payer: Self-pay | Admitting: Obstetrics and Gynecology

## 2024-04-30 ENCOUNTER — Other Ambulatory Visit: Payer: Self-pay

## 2024-04-30 ENCOUNTER — Encounter: Payer: Self-pay | Admitting: Family Medicine

## 2024-04-30 VITALS — BP 110/51 | HR 99 | Wt 192.9 lb

## 2024-04-30 DIAGNOSIS — Z1231 Encounter for screening mammogram for malignant neoplasm of breast: Secondary | ICD-10-CM

## 2024-04-30 DIAGNOSIS — N939 Abnormal uterine and vaginal bleeding, unspecified: Secondary | ICD-10-CM

## 2024-04-30 MED ORDER — NORETHINDRONE ACETATE 5 MG PO TABS
5.0000 mg | ORAL_TABLET | Freq: Every day | ORAL | 3 refills | Status: AC
  Filled 2024-04-30: qty 30, 30d supply, fill #0
  Filled 2024-06-06: qty 15, 15d supply, fill #1

## 2024-04-30 NOTE — Progress Notes (Signed)
   Subjective:    Patient ID: Courtney Edwards is a 48 y.o. female presenting with Surgical Consultation  on 04/30/2024  HPI: Has h/o heavy vaginal bleeding. She has had a normal pap smear. She has a normal EMB. Taking Aygestin  though she is unsure how much or often she took the. They did lighten her cycle. Now she has had bleeding for many days. Reports pain on her ovaries and in the midline.  Review of Systems  Constitutional:  Negative for chills and fever.  Respiratory:  Negative for shortness of breath.   Cardiovascular:  Negative for chest pain.  Gastrointestinal:  Negative for abdominal pain, nausea and vomiting.  Genitourinary:  Positive for vaginal bleeding. Negative for dysuria.  Skin:  Negative for rash.      Objective:    BP (!) 110/51   Pulse 99   Wt 192 lb 14.4 oz (87.5 kg)   LMP 04/28/2024   BMI 32.10 kg/m  Physical Exam Exam conducted with a chaperone present.  Constitutional:      General: She is not in acute distress.    Appearance: She is well-developed.  HENT:     Head: Normocephalic and atraumatic.  Eyes:     General: No scleral icterus. Cardiovascular:     Rate and Rhythm: Normal rate.  Pulmonary:     Effort: Pulmonary effort is normal.  Abdominal:     Palpations: Abdomen is soft.  Musculoskeletal:     Cervical back: Neck supple.  Skin:    General: Skin is warm and dry.  Neurological:     Mental Status: She is alert and oriented to person, place, and time.         Assessment & Plan:   Problem List Items Addressed This Visit       Unprioritized   Abnormal uterine bleeding (AUB) - Primary   She has tried and failed po meds. She prefers trial of HTA. Has [facility] discount until 07/20/2024.  Risks include but are not limited to bleeding, infection, injury to surrounding structures, including bowel, bladder and ureters, blood clots, and death.  Likelihood of success is high.       Relevant Medications   norethindrone   (AYGESTIN ) 5 MG tablet   Other Relevant Orders   Ambulatory Referral For Surgery Scheduling   Other Visit Diagnoses       Encounter for screening mammogram for malignant neoplasm of breast       Relevant Orders   MM 3D SCREENING MAMMOGRAM BILATERAL BREAST        No follow-ups on file.  Glenys GORMAN Birk, MD 04/30/2024 8:42 AM

## 2024-04-30 NOTE — Assessment & Plan Note (Addendum)
 She has tried and failed po meds. She prefers trial of HTA. Has [facility] discount until 07/20/2024.  Risks include but are not limited to bleeding, infection, injury to surrounding structures, including bowel, bladder and ureters, blood clots, and death.  Likelihood of success is high.

## 2024-05-25 ENCOUNTER — Telehealth: Payer: Self-pay

## 2024-05-25 NOTE — Telephone Encounter (Signed)
 Used interpreter services to call patient to schedule surgery w/ Dr. Fredirick on 06/19/24 at 8:30 am( ID# [id number] Jorge)Patient agreed to surgery details and was provided pre-op instructions by phone. Patient stated she has 100% financial assistance through December, 2025.

## 2024-06-04 ENCOUNTER — Telehealth: Payer: Self-pay | Admitting: Family Medicine

## 2024-06-04 DIAGNOSIS — R102 Pelvic and perineal pain unspecified side: Secondary | ICD-10-CM

## 2024-06-04 NOTE — Telephone Encounter (Signed)
 Patient is calling because she has been uncomfortable and having pain and she is scheduled for surgery on 06/19/24. She says she is wanting to get a scan or US  done before her appt on 11/3 to make sure everything is okay.

## 2024-06-06 ENCOUNTER — Other Ambulatory Visit: Payer: Self-pay

## 2024-06-06 ENCOUNTER — Telehealth: Payer: Self-pay | Admitting: Family Medicine

## 2024-06-06 ENCOUNTER — Ambulatory Visit: Payer: Self-pay

## 2024-06-06 NOTE — Telephone Encounter (Signed)
 The patient came to the office today and reported that she is scheduled for surgery on June 19, 2024. She is currently experiencing pain that begins in the upper left side and radiates down to her abdomen. Additionally, she noted feeling a lump in the affected area.

## 2024-06-06 NOTE — Telephone Encounter (Signed)
 RN called pt using [employer] [phone].  Advised pt of Dr. Fredirick recommendation that she seek consultation with primary care about this concern.  Pt verbalized understanding and had no other questions at this time.   This a second encounter for this pt concern.     Waddell, RN

## 2024-06-06 NOTE — Telephone Encounter (Signed)
  FYI Only or Action Required?: Action required by provider: request for appointment and update on patient condition.  Patient was last seen in primary care on 03/16/2024 by Theotis Haze ORN, NP.  Called Nurse Triage reporting [place].  Symptoms began several weeks ago.  Interventions attempted: Nothing.  Symptoms are: gradually worsening.  Triage Disposition: See PCP When Office is Open (Within 3 Days)  Patient/caregiver understands and will follow disposition?: Yes Copied from CRM #[id number]. Topic: Clinical - Red Word Triage >> Jun 06, 2024  4:33 PM Fonda T wrote: [facility] that prompted transfer to Nurse Triage: Spanish Interpreter Assisted Call [employer] ID# [phone]  Patient calling states she is having side pain, and feels like it is a boil or hernia and it is painful. Patient is unsure of which side it is. But would like to have it looked at or have an ultrasound ordered. Reason for Disposition  Boil > 1/2 inch across (> 12 mm; larger than a marble)  Answer Assessment - Initial Assessment Questions Patient declined to receive care advice and states that she will drive to the office to attempt to schedulle  1. APPEARANCE of BOIL: What does the boil look like?      Unable to see due to position on body 2. LOCATION: Where is the boil located?      Left inner thigh 3. NUMBER: How many boils are there?      one 4. SIZE: How big is the boil? (e.g., inches, cm; compare to size of a coin or other object)     Size of grape 5. ONSET: When did the boil start?     Two weeks ago 6. PAIN: Is there any pain? If Yes, ask: How bad is the pain?   (Scale 1-10; or mild, moderate, severe)     Only minimal pain, but constant 7. FEVER: Do you have a fever? If Yes, ask: What is it, how was it measured, and when did it start?      denies 8. SOURCE: Have you been around anyone with boils or other Staph infections? Have you ever had boils before?     denies 9. OTHER SYMPTOMS: Do you  have any other symptoms? (e.g., shaking chills, weakness, rash elsewhere on body)     Denies any of these symptoms, denies drainage 10. PREGNANCY: Is there any chance you are pregnant? When was your last menstrual period?       N/a  Protocols used: Boil (Skin Abscess)-A-AH

## 2024-06-06 NOTE — Telephone Encounter (Signed)
 RN consulted with Dr. Fredirick who advised pt should seek evaluation with her primary care for this concern.  Attempted to call pt to inform her of providers advice, left voicemail with office call back number using [employer] [phone].    Waddell, RN

## 2024-06-06 NOTE — Telephone Encounter (Addendum)
 RN returned pt call with [employer] [phone].  RN advised pt that Dr. Fredirick approved ordering pelvic ultrasound and RN could help get that scheduled for her.  Pt states  for the past few days she is experiencing persistent mild abdominal pain, mainly on her left side under her ribs that sometimes increases.  She feels like it could be a hernia and wants to know if she should be seen by a provider before scheduling an ultrasound.  Advised pt that RN will consult with provider and call her back.  Pt verbalized understanding.    Waddell, RN

## 2024-06-07 NOTE — Telephone Encounter (Signed)
 Schedule for office visit. Anyone can see her for this.SABRA

## 2024-06-08 NOTE — Telephone Encounter (Signed)
 Thank you :)

## 2024-06-10 ENCOUNTER — Emergency Department (HOSPITAL_COMMUNITY)
Admission: EM | Admit: 2024-06-10 | Discharge: 2024-06-11 | Disposition: A | Discharge status: Home / Self Care | Payer: Self-pay | Attending: Emergency Medicine | Admitting: Emergency Medicine

## 2024-06-10 ENCOUNTER — Other Ambulatory Visit: Payer: Self-pay

## 2024-06-10 DIAGNOSIS — R1013 Epigastric pain: Secondary | ICD-10-CM | POA: Insufficient documentation

## 2024-06-10 DIAGNOSIS — R1012 Left upper quadrant pain: Secondary | ICD-10-CM | POA: Insufficient documentation

## 2024-06-10 DIAGNOSIS — Z79899 Other long term (current) drug therapy: Secondary | ICD-10-CM | POA: Insufficient documentation

## 2024-06-10 DIAGNOSIS — R739 Hyperglycemia, unspecified: Secondary | ICD-10-CM

## 2024-06-10 DIAGNOSIS — E039 Hypothyroidism, unspecified: Secondary | ICD-10-CM | POA: Insufficient documentation

## 2024-06-10 DIAGNOSIS — R109 Unspecified abdominal pain: Secondary | ICD-10-CM

## 2024-06-10 DIAGNOSIS — R1032 Left lower quadrant pain: Secondary | ICD-10-CM | POA: Insufficient documentation

## 2024-06-10 MED ORDER — MORPHINE SULFATE (PF) 4 MG/ML IV SOLN
4.0000 mg | Freq: Once | INTRAVENOUS | Status: AC
  Administered 2024-06-11: 4 mg via INTRAVENOUS
  Filled 2024-06-10: qty 1

## 2024-06-10 NOTE — ED Provider Notes (Signed)
 [facility] Provider Note   CSN: [id number] Arrival date & time: 06/10/24  2229     Patient presents with: Abdominal Pain   Courtney Edwards is a 48 y.o. female.  {Add pertinent medical, surgical, social history, OB history to YEP:67052} The history is provided by the patient. A language interpreter was used [employer] [phone]).  Abdominal Pain  She has history of hyperlipidemia, hypothyroidism and complaints of abdominal pain for the last 3 weeks, getting worse.  Pain is across the lower abdomen and into the left mid and upper abdomen and sometimes radiates to the left flank.  It is worse with movement and improved following a bowel movement.  She has noted early satiety but pain is not affected by eating.  She denies fever, chills, sweats.  She denies nausea or vomiting.  She denies any urinary difficulty.  She has taken ibuprofen  without relief.    Prior to Admission medications   Medication Sig Start Date End Date Taking? Authorizing Provider  atorvastatin  (LIPITOR) 40 MG tablet Take 1 tablet (40 mg total) by mouth daily. FOR CHOLESTEROL 11/14/23   Fleming, Zelda W, NP  levothyroxine  (SYNTHROID ) 50 MCG tablet Take 1 tablet (50 mcg total) by mouth daily before breakfast. FOR THYROID  03/20/24   Fleming, Zelda W, NP  norethindrone  (AYGESTIN ) 5 MG tablet Take 1 tablet (5 mg total) by mouth daily. 04/30/24   Fredirick Glenys RAMAN, MD  omega-3 acid ethyl esters (LOVAZA ) 1 g capsule Take 2 capsules (2 g total) by mouth 2 (two) times daily. FOR cholesterol 11/14/23   Fleming, Zelda W, NP    Allergies: Patient has no known allergies.    Review of Systems  Gastrointestinal:  Positive for abdominal pain.  All other systems reviewed and are negative.   Updated Vital Signs BP 127/75   Pulse 69   Temp 98.2 F (36.8 C)   Resp 17   Ht 5' 3 (1.6 m)   Wt 86.2 kg   LMP 06/10/2024 (Exact Date)   SpO2 100%   BMI 33.66 kg/m   Physical  Exam Vitals and nursing note reviewed.   48 year old female, resting comfortably and in no acute distress. Vital signs are normal. Oxygen saturation is 100%, which is normal. Head is normocephalic and atraumatic. PERRLA, EOMI.  Back is nontender and there is no CVA tenderness. Lungs are clear without rales, wheezes, or rhonchi. Chest is nontender. Heart has regular rate and rhythm without murmur. Abdomen is soft, flat, with moderate tenderness across the suprapubic area, left lower quadrant, left upper quadrant and also mild tenderness in the epigastric area.  There is no rebound or guarding. Skin is warm and dry without rash. Neurologic: Awake and alert, moves all extremities equally.  (all labs ordered are listed, but only abnormal results are displayed) Labs Reviewed  LIPASE, BLOOD  COMPREHENSIVE METABOLIC PANEL WITH GFR  CBC  URINALYSIS, ROUTINE W REFLEX MICROSCOPIC  HCG, SERUM, QUALITATIVE    EKG: EKG Interpretation Date/Time:  Sunday June 10 2024 22:50:25 EDT Ventricular Rate:  70 PR Interval:  159 QRS Duration:  83 QT Interval:  377 QTC Calculation: 407 R Axis:   66  Text Interpretation: Sinus rhythm Normal ECG When compared with ECG of 05/23/2019, HEART RATE has decreased Confirmed by Raford Lenis ([id number]) on 06/10/2024 11:02:06 PM  Radiology: No results found.  {Document cardiac monitor, telemetry assessment procedure when appropriate:32947} Procedures   Medications Ordered in the ED - No data  to display    {Click here for ABCD2, HEART and other calculators REFRESH Note before signing:1}                              Medical Decision Making Amount and/or Complexity of Data Reviewed Labs: ordered.   Abdominal pain.  Is a presentation with a wide range of treatment options and carries with it a high risk of morbidity and complications.  Differential diagnosis includes, but is not limited to, diverticulitis, pyelonephritis, urolithiasis, pancreatitis,  cholecystitis, abdominal aortic aneurysm.  I reviewed her past records, and see no relevant past visits.  CT of abdomen and pelvis on 02/22/2017 is reported to have no acute findings but on review of the images, I do believe that she shows diverticulosis.  I have ordered screening labs and CT of abdomen and pelvis, morphine for pain.  {Document critical care time when appropriate  Document review of labs and clinical decision tools ie CHADS2VASC2, etc  Document your independent review of radiology images and any outside records  Document your discussion with family members, caretakers and with consultants  Document social determinants of health affecting pt's care  Document your decision making why or why not admission, treatments were needed:32947:::1}   Final diagnoses:  None    ED Discharge Orders     None

## 2024-06-10 NOTE — ED Triage Notes (Signed)
 Pt from home via pov with daughter.  Pt reports abdominal pain, L side and middle abdomen.  Pain started x3 weeks ago but got worse x6 days ago.  Pain increased again at 1200 today prompting ER visit.  Pt denies /N /V /D.  Pt reports scheduled surgery on Nov. 4th to scrape ovaries due to cysts.

## 2024-06-11 ENCOUNTER — Emergency Department (HOSPITAL_COMMUNITY): Payer: Self-pay

## 2024-06-11 ENCOUNTER — Encounter: Payer: Self-pay | Admitting: Nurse Practitioner

## 2024-06-11 ENCOUNTER — Ambulatory Visit (INDEPENDENT_AMBULATORY_CARE_PROVIDER_SITE_OTHER): Payer: Self-pay | Admitting: Nurse Practitioner

## 2024-06-11 ENCOUNTER — Other Ambulatory Visit: Payer: Self-pay

## 2024-06-11 VITALS — BP 106/43 | HR 58

## 2024-06-11 DIAGNOSIS — R21 Rash and other nonspecific skin eruption: Secondary | ICD-10-CM

## 2024-06-11 DIAGNOSIS — K5900 Constipation, unspecified: Secondary | ICD-10-CM

## 2024-06-11 LAB — COMPREHENSIVE METABOLIC PANEL WITH GFR
ALT: 23 U/L (ref 0–44)
AST: 23 U/L (ref 15–41)
Albumin: 4.3 g/dL (ref 3.5–5.0)
Alkaline Phosphatase: 71 U/L (ref 38–126)
Anion gap: 11 (ref 5–15)
BUN: 8 mg/dL (ref 6–20)
CO2: 22 mmol/L (ref 22–32)
Calcium: 9.5 mg/dL (ref 8.9–10.3)
Chloride: 108 mmol/L (ref 98–111)
Creatinine, Ser: 0.74 mg/dL (ref 0.44–1.00)
GFR, Estimated: >60 mL/min (ref >60)
Glucose, Bld: 189 mg/dL — ABNORMAL HIGH (ref 70–99)
Potassium: 3.7 mmol/L (ref 3.5–5.1)
Sodium: 141 mmol/L (ref 135–145)
Total Bilirubin: 0.4 mg/dL (ref 0.0–1.2)
Total Protein: 7.7 g/dL (ref 6.5–8.1)

## 2024-06-11 LAB — CBC WITH DIFFERENTIAL/PLATELET
Abs Immature Granulocytes: 0.03 K/uL (ref 0.00–0.07)
Basophils Absolute: 0.1 K/uL (ref 0.0–0.1)
Basophils Relative: 0 %
Eosinophils Absolute: 0.1 K/uL (ref 0.0–0.5)
Eosinophils Relative: 1 %
HCT: 41.6 % (ref 36.0–46.0)
Hemoglobin: 13.4 g/dL (ref 12.0–15.0)
Immature Granulocytes: 0 %
Lymphocytes Relative: 40 %
Lymphs Abs: 4.5 K/uL — ABNORMAL HIGH (ref 0.7–4.0)
MCH: 29.3 pg (ref 26.0–34.0)
MCHC: 32.2 g/dL (ref 30.0–36.0)
MCV: 91 fL (ref 80.0–100.0)
Monocytes Absolute: 0.8 K/uL (ref 0.1–1.0)
Monocytes Relative: 7 %
Neutro Abs: 5.7 K/uL (ref 1.7–7.7)
Neutrophils Relative %: 52 %
Platelets: 344 K/uL (ref 150–400)
RBC: 4.57 MIL/uL (ref 3.87–5.11)
RDW: 14 % (ref 11.5–15.5)
WBC: 11.2 K/uL — ABNORMAL HIGH (ref 4.0–10.5)
nRBC: 0 % (ref 0.0–0.2)

## 2024-06-11 LAB — URINALYSIS, ROUTINE W REFLEX MICROSCOPIC
Bilirubin Urine: NEGATIVE
Glucose, UA: NEGATIVE mg/dL
Ketones, ur: NEGATIVE mg/dL
Nitrite: NEGATIVE
Protein, ur: NEGATIVE mg/dL
Specific Gravity, Urine: 1.016 (ref 1.005–1.030)
pH: 6 (ref 5.0–8.0)

## 2024-06-11 LAB — LIPASE, BLOOD: Lipase: 29 U/L (ref 11–51)

## 2024-06-11 LAB — HCG, SERUM, QUALITATIVE: Preg, Serum: NEGATIVE

## 2024-06-11 MED ORDER — TRAMADOL HCL 50 MG PO TABS
50.0000 mg | ORAL_TABLET | Freq: Four times a day (QID) | ORAL | 0 refills | Status: AC | PRN
  Filled 2024-06-11: qty 10, 3d supply, fill #0

## 2024-06-11 MED ORDER — DOCUSATE SODIUM 100 MG PO CAPS
100.0000 mg | ORAL_CAPSULE | Freq: Two times a day (BID) | ORAL | 0 refills | Status: AC
  Filled 2024-06-11: qty 10, 5d supply, fill #0

## 2024-06-11 MED ORDER — IOHEXOL 300 MG/ML  SOLN
100.0000 mL | Freq: Once | INTRAMUSCULAR | Status: AC | PRN
  Administered 2024-06-11: 100 mL via INTRAVENOUS

## 2024-06-11 MED ORDER — DESITIN MULTI-PURPOSE HEALING EX OINT
1.0000 | TOPICAL_OINTMENT | Freq: Two times a day (BID) | CUTANEOUS | 2 refills | Status: AC
  Filled 2024-06-11: qty 99, fill #0

## 2024-06-11 NOTE — Discharge Instructions (Addendum)
 Sus pruebas de hoy no revelaron la causa de agricultural consultant. No hay signos de infeccin, clculos renales ni diverticulitis. Puede tomar acetaminofn o ibuprofeno segn sea necesario para el dolor. Si combina acetaminofn e ibuprofeno, obtendr un mayor alivio del dolor que si toma cualquiera de los woodsside medicamentos por separado. Si el dolor no se alivia con acetaminofn e ibuprofeno, puede tomar traramadol, que le he recetado. Si sus sntomas empeoran, regrese a Urgencias para una evaluacin ms exhaustiva.

## 2024-06-11 NOTE — Progress Notes (Signed)
 Subjective   Patient ID: Courtney Edwards, female    DOB: 02-09-1976, 48 y.o.   MRN: [id number]  Chief Complaint  Patient presents with   Recurrent Skin Infections    Boil on left pelvic area, has been there for 3 weeks    candida     Would like to have something for it to treat the symptoms    Pain    Located on the left side near the ribs, that has occurred 3 weeks ago, stated she hasn't been hurt pain just occurred. Prior treatment has been tylenol  and last night the ER she was given morphine    Rash    Would like to have area checked where rash was located , left side groin area     Referring provider: Theotis Haze ORN, NP  Courtney Edwards is a 48 y.o. female with Past Medical History: No date: GERD (gastroesophageal reflux disease) No date: Hyperlipidemia No date: Hypothyroidism No date: Low blood pressure No date: Medical history non-contributory No date: Mental disorder 2002: Post partum depression   HPI  Patient presents today for an ED follow-up.  She was seen in the ED yesterday for abdominal pain.  CT scan was overall negative.  Patient is scheduled for endometrial ablation on 06/19/2024.  She has been following with OB/GYN for this.  We discussed that her lower abdominal pain could be coming from heavy menstrual cycles.  Heavy menstrual cycles.  She also complains of rash to her groin.  Upon exam it does look like irritation from feminine products.  Patient has been having her period for over 2 weeks.  We discussed that we will order barrier cream.  Patient does need to follow-up with PCP on these issues.  Patient did note that she has been constipated.  We will order Colace for her. Denies f/c/s, n/v/d, hemoptysis, PND, leg swelling Denies chest pain or edema    No Known Allergies  Immunization History  Administered Date(s) Administered   Influenza, Seasonal, Injecte, Preservative Fre 05/16/2023   Influenza,inj,Quad PF,6+ Mos  08/06/2013, 07/03/2016, 05/07/2019, 05/26/2020   Influenza-Unspecified 05/19/2017   Tdap 08/23/2016    Tobacco History: Social History   Tobacco Use  Smoking Status Never  Smokeless Tobacco Never   Counseling given: Not Answered   Outpatient Encounter Medications as of 06/11/2024  Medication Sig   atorvastatin  (LIPITOR) 40 MG tablet Take 1 tablet (40 mg total) by mouth daily. FOR CHOLESTEROL   Diaper Rash Products (DESITIN MULTI-PURPOSE HEALING) OINT Apply 1 Application topically 2 (two) times daily.   docusate sodium  (COLACE) 100 MG capsule Take 1 capsule (100 mg total) by mouth 2 (two) times daily.   levothyroxine  (SYNTHROID ) 50 MCG tablet Take 1 tablet (50 mcg total) by mouth daily before breakfast. FOR THYROID    norethindrone  (AYGESTIN ) 5 MG tablet Take 1 tablet (5 mg total) by mouth daily.   omega-3 acid ethyl esters (LOVAZA ) 1 g capsule Take 2 capsules (2 g total) by mouth 2 (two) times daily. FOR cholesterol   traMADol (ULTRAM) 50 MG tablet Take 1 tablet (50 mg total) by mouth every 6 (six) hours as needed.   No facility-administered encounter medications on file as of 06/11/2024.    Review of Systems  Review of Systems  Constitutional: Negative.   HENT: Negative.    Cardiovascular: Negative.   Gastrointestinal: Negative.   Allergic/Immunologic: Negative.   Neurological: Negative.   Psychiatric/Behavioral: Negative.       Objective:   BP ROLLEN)  106/43 (BP Location: Left Arm, Patient Position: Sitting, Cuff Size: Large)   Pulse (!) 58   LMP 06/10/2024 (Exact Date)   SpO2 100%   Wt Readings from Last 5 Encounters:  06/10/24 190 lb (86.2 kg)  04/30/24 192 lb 14.4 oz (87.5 kg)  03/16/24 194 lb 3.2 oz (88.1 kg)  11/14/23 194 lb 9.6 oz (88.3 kg)  11/10/23 196 lb (88.9 kg)     Physical Exam Vitals and nursing note reviewed.  Constitutional:      General: She is not in acute distress.    Appearance: She is well-developed.  Cardiovascular:     Rate and Rhythm:  Regular rhythm.  Pulmonary:     Effort: Pulmonary effort is normal.     Breath sounds: Normal breath sounds.  Neurological:     Mental Status: She is alert and oriented to person, place, and time.       Assessment & Plan:   Rash -     Desitin Multi-Purpose Healing; Apply 1 Application topically 2 (two) times daily.  Dispense: 99 g; Refill: 2  Constipation, unspecified constipation type -     Docusate Sodium ; Take 1 capsule (100 mg total) by mouth 2 (two) times daily.  Dispense: 10 capsule; Refill: 0     Return if symptoms worsen or fail to improve.   Courtney GORMAN Borer, NP 06/11/2024

## 2024-06-12 ENCOUNTER — Other Ambulatory Visit: Payer: Self-pay

## 2024-06-14 ENCOUNTER — Encounter (HOSPITAL_COMMUNITY): Payer: Self-pay | Admitting: Family Medicine

## 2024-06-14 ENCOUNTER — Encounter (HOSPITAL_COMMUNITY): Payer: Self-pay

## 2024-06-14 NOTE — H&P (Signed)
 Courtney Edwards is an 48 y.o. [id number] female.   Chief Complaint: abnormal uterine bleeding HPI: here for definitive treatment due to abnormal bleeding. She is on Aygestin .  This helps, but she prefers a more definitive treatment  Past Medical History:  Diagnosis Date   GERD (gastroesophageal reflux disease)    Hyperlipidemia    Hypothyroidism    Low blood pressure    Medical history non-contributory    Mental disorder    PONV (postoperative nausea and vomiting)    Post partum depression 2002   Pre-diabetes     Past Surgical History:  Procedure Laterality Date   LAPAROSCOPY N/A 02/09/2017   Procedure: LAPAROSCOPY DIAGNOSTIC;  Surgeon: Starla Harland BROCKS, MD;  Location: [facility];  Service: Gynecology;  Laterality: N/A;   NO PAST SURGERIES      Family History  Problem Relation Age of Onset   Hypertension Mother    Diabetes Mother    Heart disease Mother    Colon cancer Neg Hx    Pancreatic cancer Neg Hx    Esophageal cancer Neg Hx    Rectal cancer Neg Hx    Stomach cancer Neg Hx    Liver cancer Neg Hx    Social History:  reports that she has never smoked. She has never used smokeless tobacco. She reports that she does not drink alcohol and does not use drugs.  Allergies: No Known Allergies  No medications prior to admission.    A comprehensive review of systems was negative.  Last menstrual period 06/10/2024. General appearance: alert, cooperative, and appears stated age Head: Normocephalic, without obvious abnormality, atraumatic Neck: supple, symmetrical, trachea midline Lungs: clear to auscultation bilaterally Heart: regular rate and rhythm Abdomen: soft, non-tender; bowel sounds normal; no masses,  no organomegaly Skin: Skin color, texture, turgor normal. No rashes or lesions   No results found for this or any previous visit (from the past 24 hours). CT ABDOMEN PELVIS W CONTRAST Result Date: 06/11/2024 EXAM: CT ABDOMEN AND PELVIS WITH CONTRAST  06/11/2024 01:20:27 AM TECHNIQUE: CT of the abdomen and pelvis was performed with the administration of 100 mL of iohexol  (OMNIPAQUE ) 300 MG/ML solution. Multiplanar reformatted images are provided for review. Automated exposure control, iterative reconstruction, and/or weight-based adjustment of the mA/kV was utilized to reduce the radiation dose to as low as reasonably achievable. COMPARISON: CT abdomen and pelvis 02/22/2017. CLINICAL HISTORY: LLQ abdominal pain. FINDINGS: LOWER CHEST: No acute abnormality. LIVER: The liver is unremarkable. GALLBLADDER AND BILE DUCTS: Gallbladder is unremarkable. No biliary ductal dilatation. SPLEEN: No acute abnormality. PANCREAS: No acute abnormality. ADRENAL GLANDS: No acute abnormality. KIDNEYS, URETERS AND BLADDER: No stones in the kidneys or ureters. No hydronephrosis. No perinephric or periureteral stranding. Urinary bladder is unremarkable. GI AND BOWEL: Stomach demonstrates no acute abnormality. The appendix is not seen. There is no bowel obstruction. PERITONEUM AND RETROPERITONEUM: No ascites. No free air. VASCULATURE: Aorta is normal in caliber. LYMPH NODES: No lymphadenopathy. REPRODUCTIVE ORGANS: There is a 3.6 cm left ovarian cyst. BONES AND SOFT TISSUES: No acute osseous abnormality. There is a small fat-containing umbilical hernia. IMPRESSION: 1. No acute findings. 2. 3.6 cm left ovarian simple-appearing cyst in a presumed premenopausal patient: no follow-up imaging recommended. If postmenopausal, recommend follow-up pelvic ultrasound in 612 months. If assessment is limited, recommend prompt pelvic ultrasound for characterization. 3. Small fat-containing umbilical hernia. Electronically signed by: Greig Pique MD 06/11/2024 01:25 AM EDT RP Workstation: [id number]    Assessment/Plan Active Problems:   Abnormal uterine bleeding (  AUB)  For HTA. Husband has had a vasectomy so no need for tubal. Risks include but are not limited to bleeding, infection, injury  to surrounding structures, including bowel, bladder and ureters, blood clots, and death.  Likelihood of success is high.    Glenys GORMAN Birk 06/14/2024, 3:24 PM

## 2024-06-14 NOTE — Progress Notes (Signed)
 Spoke w/ via phone for pre-op interview--- Courtney Edwards. Language interpreter used, ID# [phone] Lab needs dos----   UPT per anesthesia.      Lab results------Current EKG in Epic dated 06/13/24. COVID test -----patient states asymptomatic no test needed Arrive at -------0530 NPO after MN NO Solid Food.   Med rec completed Medications to take morning of surgery -----NONE Diabetic medication -----  GLP1 agonist last dose: GLP1 instructions:  Patient instructed no nail polish to be worn day of surgery Patient instructed to bring photo id and insurance card day of surgery Patient aware to have Driver (ride ) / caregiver    for 24 hours after surgery - Husband Courtney Edwards Patient Special Instructions ----- Pre-Op special Instructions ----- spanish interpreter requested for surgery day.  Patient verbalized understanding of instructions that were given at this phone interview. Patient denies chest pain, sob, fever, cough at the interview.

## 2024-06-18 ENCOUNTER — Encounter: Payer: Self-pay | Admitting: Family Medicine

## 2024-06-18 ENCOUNTER — Other Ambulatory Visit: Payer: Self-pay

## 2024-06-19 ENCOUNTER — Encounter (HOSPITAL_COMMUNITY): Payer: Self-pay | Admitting: Family Medicine

## 2024-06-19 ENCOUNTER — Ambulatory Visit (HOSPITAL_COMMUNITY): Payer: Self-pay | Admitting: Anesthesiology

## 2024-06-19 ENCOUNTER — Other Ambulatory Visit: Payer: Self-pay

## 2024-06-19 ENCOUNTER — Ambulatory Visit (HOSPITAL_COMMUNITY)
Admission: RE | Admit: 2024-06-19 | Discharge: 2024-06-19 | Disposition: A | Discharge status: Home / Self Care | Payer: Self-pay | Attending: Family Medicine | Admitting: Family Medicine

## 2024-06-19 ENCOUNTER — Ambulatory Visit (HOSPITAL_BASED_OUTPATIENT_CLINIC_OR_DEPARTMENT_OTHER): Payer: Self-pay | Admitting: Anesthesiology

## 2024-06-19 ENCOUNTER — Encounter (HOSPITAL_COMMUNITY)
Admission: RE | Disposition: A | Discharge status: Home / Self Care | Payer: Self-pay | Source: Home / Self Care | Attending: Family Medicine

## 2024-06-19 DIAGNOSIS — D250 Submucous leiomyoma of uterus: Secondary | ICD-10-CM

## 2024-06-19 DIAGNOSIS — N920 Excessive and frequent menstruation with regular cycle: Secondary | ICD-10-CM

## 2024-06-19 DIAGNOSIS — N840 Polyp of corpus uteri: Secondary | ICD-10-CM | POA: Insufficient documentation

## 2024-06-19 DIAGNOSIS — N939 Abnormal uterine and vaginal bleeding, unspecified: Secondary | ICD-10-CM | POA: Diagnosis present

## 2024-06-19 DIAGNOSIS — Z01818 Encounter for other preprocedural examination: Secondary | ICD-10-CM

## 2024-06-19 HISTORY — DX: Prediabetes: R73.03

## 2024-06-19 HISTORY — DX: Umbilical hernia without obstruction or gangrene: K42.9

## 2024-06-19 HISTORY — DX: Nausea with vomiting, unspecified: R11.2

## 2024-06-19 HISTORY — PX: HYSTEROSCOPY: SHX211

## 2024-06-19 HISTORY — PX: DILATION AND CURETTAGE OF UTERUS: SHX78

## 2024-06-19 LAB — CBC
HCT: 39.5 % (ref 36.0–46.0)
Hemoglobin: 13.4 g/dL (ref 12.0–15.0)
MCH: 30.2 pg (ref 26.0–34.0)
MCHC: 33.9 g/dL (ref 30.0–36.0)
MCV: 89.2 fL (ref 80.0–100.0)
Platelets: 333 K/uL (ref 150–400)
RBC: 4.43 MIL/uL (ref 3.87–5.11)
RDW: 13.8 % (ref 11.5–15.5)
WBC: 8.4 K/uL (ref 4.0–10.5)
nRBC: 0 % (ref 0.0–0.2)

## 2024-06-19 LAB — POCT PREGNANCY, URINE: Preg Test, Ur: NEGATIVE

## 2024-06-19 SURGERY — ABLATION, ENDOMETRIUM, HYSTEROSCOPIC
Anesthesia: General | Site: Uterus

## 2024-06-19 MED ORDER — DEXAMETHASONE SOD PHOSPHATE PF 10 MG/ML IJ SOLN
INTRAMUSCULAR | Status: DC | PRN
Start: 2024-06-19 — End: 2024-06-19
  Administered 2024-06-19: 10 mg via INTRAVENOUS

## 2024-06-19 MED ORDER — EPHEDRINE SULFATE-NACL 50-0.9 MG/10ML-% IV SOSY
PREFILLED_SYRINGE | INTRAVENOUS | Status: DC | PRN
  Administered 2024-06-19 (×2): 5 mg via INTRAVENOUS

## 2024-06-19 MED ORDER — KETOROLAC TROMETHAMINE 30 MG/ML IJ SOLN
INTRAMUSCULAR | Status: DC | PRN
  Administered 2024-06-19: 30 mg via INTRAVENOUS

## 2024-06-19 MED ORDER — PROPOFOL 500 MG/50ML IV EMUL
INTRAVENOUS | Status: DC | PRN
  Administered 2024-06-19: 150 ug/kg/min via INTRAVENOUS

## 2024-06-19 MED ORDER — MIDAZOLAM HCL 2 MG/2ML IJ SOLN
INTRAMUSCULAR | Status: AC
  Filled 2024-06-19: qty 2

## 2024-06-19 MED ORDER — GABAPENTIN 300 MG PO CAPS
300.0000 mg | ORAL_CAPSULE | ORAL | Status: AC
  Administered 2024-06-19: 300 mg via ORAL

## 2024-06-19 MED ORDER — ACETAMINOPHEN 500 MG PO TABS
1000.0000 mg | ORAL_TABLET | ORAL | Status: AC
  Administered 2024-06-19: 1000 mg via ORAL

## 2024-06-19 MED ORDER — CELECOXIB 200 MG PO CAPS
ORAL_CAPSULE | ORAL | Status: DC
Start: 2024-06-19 — End: 2024-06-19
  Filled 2024-06-19: qty 2

## 2024-06-19 MED ORDER — LACTATED RINGERS IV SOLN: INTRAVENOUS | Status: DC

## 2024-06-19 MED ORDER — POVIDONE-IODINE 10 % EX SWAB: 2.0000 | Freq: Once | CUTANEOUS | Status: DC

## 2024-06-19 MED ORDER — ORAL CARE MOUTH RINSE: 15.0000 mL | Freq: Once | OROMUCOSAL | Status: AC

## 2024-06-19 MED ORDER — LIDOCAINE HCL 1 % IJ SOLN
INTRAMUSCULAR | Status: DC | PRN
  Administered 2024-06-19: 20 mL

## 2024-06-19 MED ORDER — FENTANYL CITRATE (PF) 250 MCG/5ML IJ SOLN
INTRAMUSCULAR | Status: DC | PRN
  Administered 2024-06-19 (×2): 25 ug via INTRAVENOUS
  Administered 2024-06-19: 50 ug via INTRAVENOUS

## 2024-06-19 MED ORDER — CELECOXIB 200 MG PO CAPS
400.0000 mg | ORAL_CAPSULE | ORAL | Status: AC
  Administered 2024-06-19: 400 mg via ORAL

## 2024-06-19 MED ORDER — ACETAMINOPHEN 500 MG PO TABS
ORAL_TABLET | ORAL | Status: AC
  Filled 2024-06-19: qty 2

## 2024-06-19 MED ORDER — PROPOFOL 10 MG/ML IV BOLUS
INTRAVENOUS | Status: AC
  Filled 2024-06-19: qty 20

## 2024-06-19 MED ORDER — OXYCODONE HCL 5 MG PO TABS: 5.0000 mg | ORAL_TABLET | Freq: Four times a day (QID) | ORAL | 0 refills | Status: AC | PRN

## 2024-06-19 MED ORDER — OXYCODONE HCL 5 MG PO TABS: 5.0000 mg | ORAL_TABLET | Freq: Once | ORAL | Status: DC | PRN

## 2024-06-19 MED ORDER — DEXMEDETOMIDINE HCL IN NACL 80 MCG/20ML IV SOLN
INTRAVENOUS | Status: AC
  Filled 2024-06-19: qty 20

## 2024-06-19 MED ORDER — OXYCODONE HCL 5 MG/5ML PO SOLN: 5.0000 mg | Freq: Once | ORAL | Status: DC | PRN

## 2024-06-19 MED ORDER — ONDANSETRON HCL 4 MG/2ML IJ SOLN
INTRAMUSCULAR | Status: AC
  Filled 2024-06-19: qty 2

## 2024-06-19 MED ORDER — LIDOCAINE 2% (20 MG/ML) 5 ML SYRINGE
INTRAMUSCULAR | Status: DC | PRN
  Administered 2024-06-19: 40 mg via INTRAVENOUS

## 2024-06-19 MED ORDER — SODIUM CHLORIDE 0.9 % IR SOLN
Status: DC | PRN
  Administered 2024-06-19: 3000 mL

## 2024-06-19 MED ORDER — LIDOCAINE HCL 1 % IJ SOLN
INTRAMUSCULAR | Status: AC
  Filled 2024-06-19: qty 20

## 2024-06-19 MED ORDER — ONDANSETRON HCL 4 MG/2ML IJ SOLN
INTRAMUSCULAR | Status: DC | PRN
  Administered 2024-06-19: 4 mg via INTRAVENOUS

## 2024-06-19 MED ORDER — CHLORHEXIDINE GLUCONATE 0.12 % MT SOLN
15.0000 mL | Freq: Once | OROMUCOSAL | Status: AC
  Administered 2024-06-19: 15 mL via OROMUCOSAL

## 2024-06-19 MED ORDER — DROPERIDOL 2.5 MG/ML IJ SOLN
0.6250 mg | Freq: Once | INTRAMUSCULAR | Status: AC | PRN
  Administered 2024-06-19: 0.625 mg via INTRAVENOUS

## 2024-06-19 MED ORDER — FENTANYL CITRATE (PF) 100 MCG/2ML IJ SOLN
INTRAMUSCULAR | Status: AC
  Filled 2024-06-19: qty 2

## 2024-06-19 MED ORDER — PROPOFOL 10 MG/ML IV BOLUS
INTRAVENOUS | Status: DC | PRN
  Administered 2024-06-19: 200 mg via INTRAVENOUS

## 2024-06-19 MED ORDER — DROPERIDOL 2.5 MG/ML IJ SOLN
INTRAMUSCULAR | Status: AC
  Filled 2024-06-19: qty 2

## 2024-06-19 MED ORDER — ACETAMINOPHEN 10 MG/ML IV SOLN
1000.0000 mg | Freq: Once | INTRAVENOUS | Status: DC | PRN
Start: 2024-06-19 — End: 2024-06-19

## 2024-06-19 MED ORDER — FENTANYL CITRATE (PF) 100 MCG/2ML IJ SOLN
25.0000 ug | INTRAMUSCULAR | Status: DC | PRN
  Administered 2024-06-19: 25 ug via INTRAVENOUS
  Administered 2024-06-19: 50 ug via INTRAVENOUS

## 2024-06-19 MED ORDER — LIDOCAINE 2% (20 MG/ML) 5 ML SYRINGE
INTRAMUSCULAR | Status: AC
  Filled 2024-06-19: qty 5

## 2024-06-19 MED ORDER — MIDAZOLAM HCL (PF) 2 MG/2ML IJ SOLN
INTRAMUSCULAR | Status: DC | PRN
Start: 2024-06-19 — End: 2024-06-19
  Administered 2024-06-19: 2 mg via INTRAVENOUS

## 2024-06-19 MED ORDER — CHLORHEXIDINE GLUCONATE 0.12 % MT SOLN
OROMUCOSAL | Status: AC
  Filled 2024-06-19: qty 15

## 2024-06-19 MED ORDER — GABAPENTIN 300 MG PO CAPS
ORAL_CAPSULE | ORAL | Status: AC
  Filled 2024-06-19: qty 1

## 2024-06-19 SURGICAL SUPPLY — 12 items
ABLATOR SURESOUND NOVASURE (ABLATOR) IMPLANT
CATH ROBINSON RED A/P 16FR (CATHETERS) IMPLANT
COVER MAYO STAND STRL (DRAPES) ×3 IMPLANT
GLOVE ECLIPSE 7.0 STRL STRAW (GLOVE) ×3 IMPLANT
GLOVE SURG UNDER POLY LF SZ7 (GLOVE) ×6 IMPLANT
GOWN STRL REUS W/ TWL XL LVL3 (GOWN DISPOSABLE) ×3 IMPLANT
PACK VAGINAL MINOR WOMEN LF (CUSTOM PROCEDURE TRAY) ×3 IMPLANT
PAD OB MATERNITY 11 LF (PERSONAL CARE ITEMS) ×3 IMPLANT
SEAL ROD LENS SCOPE MYOSURE (ABLATOR) IMPLANT
SET GENESYS HTA PROCERVA (MISCELLANEOUS) IMPLANT
TOWEL GREEN STERILE FF (TOWEL DISPOSABLE) ×3 IMPLANT
UNDERPAD 30X36 HEAVY ABSORB (UNDERPADS AND DIAPERS) ×3 IMPLANT

## 2024-06-19 NOTE — Anesthesia Preprocedure Evaluation (Addendum)
 Anesthesia Evaluation  Patient identified by MRN, date of birth, ID band Patient awake    Reviewed: Allergy & Precautions, NPO status , Patient's Chart, lab work & pertinent test results  History of Anesthesia Complications (+) PONV and history of anesthetic complications  Airway Mallampati: I       Dental  (+) Partial Upper, Dental Advisory Given   Pulmonary neg pulmonary ROS   breath sounds clear to auscultation       Cardiovascular negative cardio ROS  Rhythm:Regular Rate:Normal     Neuro/Psych  PSYCHIATRIC DISORDERS  Depression    negative neurological ROS     GI/Hepatic Neg liver ROS,GERD  ,,  Endo/Other  Hypothyroidism    Renal/GU negative Renal ROS     Musculoskeletal   Abdominal   Peds  Hematology negative hematology ROS (+)   Anesthesia Other Findings   Reproductive/Obstetrics                              Anesthesia Physical Anesthesia Plan  ASA: 2  Anesthesia Plan: General   Post-op Pain Management: Gabapentin PO (pre-op)*, Celebrex PO (pre-op)* and Tylenol  PO (pre-op)*   Induction: Intravenous  PONV Risk Score and Plan: 4 or greater and Ondansetron , Dexamethasone , Midazolam , Scopolamine  patch - Pre-op and TIVA  Airway Management Planned: LMA  Additional Equipment: None  Intra-op Plan:   Post-operative Plan: Extubation in OR  Informed Consent: I have reviewed the patients History and Physical, chart, labs and discussed the procedure including the risks, benefits and alternatives for the proposed anesthesia with the patient or authorized representative who has indicated his/her understanding and acceptance.     Dental advisory given and Interpreter used for interview  Plan Discussed with: CRNA  Anesthesia Plan Comments:          Anesthesia Quick Evaluation

## 2024-06-19 NOTE — Op Note (Signed)
 Preoperative diagnosis: Menorrhagia  Postoperative diagnosis: Menorrhagia  Procedure: HTA endometrial ablation, hysteroscopy, D & C  Surgeon: Glenys RAMAN. Fredirick, M.D.  Anesthesia: GETT - Hollis, Kevin D, MD Paracervical block  Findings: Normal appearing cervix and uterine cavity  with small anterior submucosal fibroid and small polyp with both ostia seen.  Estimated blood loss: Minimal  Specimens: Endometrial curettings  Disposition of specimen: Pathology  Reason for procedure: Patient is a 49 y.o. [id number] with abnormal bleeding who desired definitive treatment.   Procedure: Patient was taken to the OR where she was placed in dorsal lithotomy in Allen stirrups. SCDs were in place. An adequate timeout was obtained. The patient was prepped and draped in the usual sterile fashion. A speculum was placed inside the vagina and the cervix visualized. The cervix was grasped anteriorly with a single-tooth tenaculum. 20 cc of quarter percent Marcaine  were injected for paracervical block. Sequential dilation was done to a #21 dilator, and the HTA with hysteroscope was introduced into the uterine cavity. The cervix and endometrial lining except as outlined above appeared normal both ostia were seen. A seal test was done and was passed. The uterine cavity was heated to between 80 and 90C and HTA was performed for 10 minutes. Cooling was then performed and the instrument removed. Sharp curettage was then performed and sample sent to pathology. All instrument, needle, and lap counts were correct x2. The patient was awakened taken to recovery room in stable condition.  Glenys RAMAN Fredirick MD 06/19/2024 8:24 AM

## 2024-06-19 NOTE — Anesthesia Postprocedure Evaluation (Addendum)
 Anesthesia Post Note  Patient: Courtney Edwards  Procedure(s) Performed: ABLATION, ENDOMETRIUM, HYSTEROSCOPIC (Uterus) DILATION AND CURETTAGE (Vagina )     Patient location during evaluation: PACU Anesthesia Type: General Level of consciousness: awake and alert Pain management: pain level controlled Vital Signs Assessment: post-procedure vital signs reviewed and stable Respiratory status: spontaneous breathing, nonlabored ventilation, respiratory function stable and patient connected to nasal cannula oxygen Cardiovascular status: blood pressure returned to baseline and stable Postop Assessment: no apparent nausea or vomiting Anesthetic complications: no   No notable events documented.  Last Vitals:  Vitals:   06/19/24 0847 06/19/24 0900  BP: (!) 111/53 115/63  Pulse: 66 64  Resp: 19 (!) 22  Temp:    SpO2: 100% 100%                Franky JONETTA Bald

## 2024-06-19 NOTE — Transfer of Care (Signed)
 Immediate Anesthesia Transfer of Care Note  Patient: Kathy Wahid  Procedure(s) Performed: ABLATION, ENDOMETRIUM, HYSTEROSCOPIC (Uterus) DILATION AND CURETTAGE (Vagina )  Patient Location: PACU  Anesthesia Type:General  Level of Consciousness: awake, alert , patient cooperative, and responds to stimulation  Airway & Oxygen Therapy: Patient Spontanous Breathing and Patient connected to face mask oxygen  Post-op Assessment: Report given to RN, Post -op Vital signs reviewed and stable, and Patient moving all extremities X 4  Post vital signs: Reviewed and stable  Last Vitals:  Vitals Value Taken Time  BP    Temp    Pulse 68 06/19/24 08:30  Resp    SpO2 98 % 06/19/24 08:30  Vitals shown include unfiled device data.  Last Pain:  Vitals:   06/19/24 0607  TempSrc: Oral  PainSc: 5       Patients Stated Pain Goal: 5 (06/19/24 9392)  Complications: No notable events documented.

## 2024-06-19 NOTE — Anesthesia Procedure Notes (Signed)
 Procedure Name: LMA Insertion Date/Time: 06/19/2024 7:38 AM  Performed by: Jolynn Mage, CRNAPre-anesthesia Checklist: Patient identified, Emergency Drugs available, Suction available and Patient being monitored Patient Re-evaluated:Patient Re-evaluated prior to induction Oxygen Delivery Method: Circle system utilized Preoxygenation: Pre-oxygenation with 100% oxygen Induction Type: IV induction Ventilation: Mask ventilation without difficulty LMA: LMA with gastric port inserted LMA Size: 4.0 Number of attempts: 1 Placement Confirmation: positive ETCO2 and breath sounds checked- equal and bilateral Tube secured with: Tape Dental Injury: Teeth and Oropharynx as per pre-operative assessment

## 2024-06-19 NOTE — Discharge Instructions (Addendum)
  Post Anesthesia Home Care Instructions  Activity: Get plenty of rest for the remainder of the day. A responsible individual must stay with you for 24 hours following the procedure.  For the next 24 hours, DO NOT: -Drive a car -Advertising copywriter -Drink alcoholic beverages -Take any medication unless instructed by your physician -Make any legal decisions or sign important papers.  Meals: Start with liquid foods such as gelatin or soup. Progress to regular foods as tolerated. Avoid greasy, spicy, heavy foods. If nausea and/or vomiting occur, drink only clear liquids until the nausea and/or vomiting subsides. Call your physician if vomiting continues.  Special Instructions/Symptoms: Your throat may feel dry or sore from the anesthesia or the breathing tube placed in your throat during surgery. If this causes discomfort, gargle with warm salt water. The discomfort should disappear within 24 hours.  If you had a scopolamine  patch placed behind your ear for the management of post- operative nausea and/or vomiting:  1. The medication in the patch is effective for 72 hours, after which it should be removed.  Wrap patch in a tissue and discard in the trash. Wash hands thoroughly with soap and water. 2. You may remove the patch earlier than 72 hours if you experience unpleasant side effects which may include dry mouth, dizziness or visual disturbances. 3. Avoid touching the patch. Wash your hands with soap and water after contact with the patch.  Do not take any tylenol  until after 12:20 pm. You received tylenol  1,000 mg at 6:19 am. Do not take any nonsteroidal anti inflammatories until after 2:15 pm. You received Toradol  30 mg at 8:15 am.

## 2024-06-19 NOTE — Progress Notes (Signed)
 This encounter was created in error - please disregard.  Pt. Scheduled pre-op with planned surgery tomorrow.

## 2024-06-19 NOTE — Interval H&P Note (Signed)
 History and Physical Interval Note:  06/19/2024 7:02 AM  Courtney Edwards  has presented today for surgery, with the diagnosis of AUB.  The various methods of treatment have been discussed with the patient and family. After consideration of risks, benefits and other options for treatment, the patient has consented to  Procedure(s) with comments: ABLATION, ENDOMETRIUM, HYSTEROSCOPIC (N/A) - Hysteroscopy/HTA DILATION AND CURETTAGE (N/A) as a surgical intervention.  The patient's history has been reviewed, patient examined, no change in status, stable for surgery.  I have reviewed the patient's chart and labs.  Questions were answered to the patient's satisfaction.     Glenys GORMAN Birk

## 2024-06-20 ENCOUNTER — Encounter (HOSPITAL_COMMUNITY): Payer: Self-pay | Admitting: Family Medicine

## 2024-06-21 LAB — SURGICAL PATHOLOGY

## 2024-07-02 NOTE — Progress Notes (Unsigned)
 Ms. Courtney Edwards is a 48 y.o. female who presents to [facility] clinic today with {Blank single:19197::no complaints,complaint of} ***.    Pap Smear: Pap smear not completed today. Last Pap smear was 05/16/2023 at [facility] and was normal with negative HPV. Per patient has {Blank single:19197::no history,history} of an abnormal Pap smear. Last Pap smear result is available in Epic.   Physical exam: Breasts Breasts symmetrical. No skin abnormalities bilateral breasts. No nipple retraction bilateral breasts. No nipple discharge bilateral breasts. No lymphadenopathy. No lumps palpated bilateral breasts.      MS DIGITAL SCREENING TOMO BILATERAL Result Date: 07/05/2019 CLINICAL DATA:  Screening. EXAM: DIGITAL SCREENING BILATERAL MAMMOGRAM WITH TOMO AND CAD COMPARISON:  None. ACR Breast Density Category b: There are scattered areas of fibroglandular density. FINDINGS: There are no findings suspicious for malignancy. Images were processed with CAD. IMPRESSION: No mammographic evidence of malignancy. A result letter of this screening mammogram will be mailed directly to the patient. RECOMMENDATION: Screening mammogram in one year. (Code:SM-B-01Y) BI-RADS CATEGORY  1: Negative. Electronically Signed   By: Inocente Ast M.D.   On: 07/05/2019 10:13    Pelvic/Bimanual Pap is not indicated today per BCCCP guidelines.   Smoking History: Patient has {Blank single:19197::never smoked,is a former smoker,is a current smoker at *** packs per day} ***referred to quit line.    Patient Navigation: Patient education provided. Access to services provided for patient through [facility] program. Spanish interpreter Bernice Angry from [facility] provided.   Colorectal Cancer Screening: Per patient {Blank single:19197::has had colonoscopy completed on ***,has never had colonoscopy completed} No complaints today.    Breast and Cervical Cancer Risk  Assessment: Patient {Blank single:19197::has,does not have} family history of breast cancer, known genetic mutations, or radiation treatment to the chest before age 43. Patient {Blank single:19197::has,does not have} history of cervical dysplasia, immunocompromised, or DES exposure in-utero.  Risk Assessment   No risk assessment data     A: BCCCP exam without pap smear Complaint of ***  P: Referred patient to the [facility] for a screening mammogram on mobile unit. Appointment scheduled Tuesday, July 03, 2024 at 1140.  Driscilla Wanda SQUIBB, RN 07/02/2024 10:29 AM

## 2024-07-03 ENCOUNTER — Ambulatory Visit (INDEPENDENT_AMBULATORY_CARE_PROVIDER_SITE_OTHER): Payer: Self-pay | Admitting: Family Medicine

## 2024-07-03 ENCOUNTER — Ambulatory Visit
Admission: RE | Admit: 2024-07-03 | Discharge: 2024-07-03 | Disposition: A | Discharge status: Home / Self Care | Payer: Self-pay | Source: Ambulatory Visit | Attending: Obstetrics and Gynecology | Admitting: Obstetrics and Gynecology

## 2024-07-03 ENCOUNTER — Ambulatory Visit: Payer: Self-pay

## 2024-07-03 ENCOUNTER — Encounter: Payer: Self-pay | Admitting: Family Medicine

## 2024-07-03 ENCOUNTER — Other Ambulatory Visit: Payer: Self-pay

## 2024-07-03 VITALS — BP 113/75 | HR 78 | Wt 194.2 lb

## 2024-07-03 VITALS — BP 112/57 | Wt 193.0 lb

## 2024-07-03 DIAGNOSIS — N939 Abnormal uterine and vaginal bleeding, unspecified: Secondary | ICD-10-CM

## 2024-07-03 DIAGNOSIS — Z1231 Encounter for screening mammogram for malignant neoplasm of breast: Secondary | ICD-10-CM

## 2024-07-03 DIAGNOSIS — Z1239 Encounter for other screening for malignant neoplasm of breast: Secondary | ICD-10-CM

## 2024-07-03 DIAGNOSIS — N393 Stress incontinence (female) (male): Secondary | ICD-10-CM

## 2024-07-03 DIAGNOSIS — Z1211 Encounter for screening for malignant neoplasm of colon: Secondary | ICD-10-CM

## 2024-07-03 LAB — POCT URINALYSIS DIP (DEVICE)
Bilirubin Urine: NEGATIVE
Glucose, UA: NEGATIVE mg/dL
Ketones, ur: NEGATIVE mg/dL
Nitrite: NEGATIVE
Protein, ur: NEGATIVE mg/dL
Specific Gravity, Urine: 1.025 (ref 1.005–1.030)
Urobilinogen, UA: 0.2 mg/dL (ref 0.0–1.0)
pH: 5.5 (ref 5.0–8.0)

## 2024-07-03 NOTE — Patient Instructions (Signed)
 Explained breast self awareness with Courtney Edwards. Patient did not need a Pap smear today due to last Pap smear and HPV typing was 05/16/2023. Let her know BCCCP will cover Pap smears and HPV typing every 5 years unless has a history of abnormal Pap smears. Referred patient to the [facility] for a screening mammogram on mobile unit. Appointment scheduled Tuesday, July 03, 2024 at 1140. Patient aware of appointment and will be there. Let patient know the Breast Center will follow up with her within the next couple weeks with results of mammogram by letter or phone. [name] verbalized understanding.  Maleeha Halls, Wanda Ship, RN 10:14 AM

## 2024-07-03 NOTE — Assessment & Plan Note (Addendum)
 S/p HTA, doing well. Usual recovery and expectations reviewed. Remain on pelvic rest while bleeding.

## 2024-07-03 NOTE — Progress Notes (Signed)
   Subjective:    Patient ID: Courtney Edwards is a 48 y.o. female presenting with Post-op Follow-up  on 07/03/2024  HPI: Having some mild spotting since HTA on 11/42025. She is having leaking urine. When she has urge to urinate, she leaks a little.  Review of Systems  Constitutional:  Negative for chills and fever.  Respiratory:  Negative for shortness of breath.   Cardiovascular:  Negative for chest pain.  Gastrointestinal:  Negative for abdominal pain, nausea and vomiting.  Genitourinary:  Negative for dysuria.       Stress and urge incontinence  Skin:  Negative for rash.      Objective:    BP 113/75   Pulse 78   Wt 194 lb 3.2 oz (88.1 kg)   LMP 06/10/2024 (Exact Date)   BMI 34.40 kg/m  Physical Exam Exam conducted with a chaperone present.  Constitutional:      General: She is not in acute distress.    Appearance: She is well-developed.  HENT:     Head: Normocephalic and atraumatic.  Eyes:     General: No scleral icterus. Cardiovascular:     Rate and Rhythm: Normal rate.  Pulmonary:     Effort: Pulmonary effort is normal.  Abdominal:     Palpations: Abdomen is soft.  Musculoskeletal:     Cervical back: Neck supple.  Skin:    General: Skin is warm and dry.  Neurological:     Mental Status: She is alert and oriented to person, place, and time.    Urinalysis    Component Value Date/Time   COLORURINE YELLOW 06/11/2024 0140   APPEARANCEUR CLEAR 06/11/2024 0140   APPEARANCEUR Clear 12/22/2022 1222   LABSPEC 1.016 06/11/2024 0140   PHURINE 6.0 06/11/2024 0140   GLUCOSEU NEGATIVE 06/11/2024 0140   HGBUR LARGE (A) 06/11/2024 0140   HGBUR negative 11/03/2006 0903   BILIRUBINUR NEGATIVE 06/11/2024 0140   BILIRUBINUR Negative 12/22/2022 1222   KETONESUR NEGATIVE 06/11/2024 0140   PROTEINUR NEGATIVE 06/11/2024 0140   UROBILINOGEN 0.2 09/16/2022 0958   NITRITE NEGATIVE 06/11/2024 0140   LEUKOCYTESUR TRACE (A) 06/11/2024 0140           Assessment & Plan:   Problem List Items Addressed This Visit       Unprioritized   Abnormal uterine bleeding (AUB)   S/p HTA, doing well. Usual recovery and expectations reviewed. Remain on pelvic rest while bleeding.      Other Visit Diagnoses       Stress incontinence    -  Primary   New, check urine cx. U/A is indicative, but bleeding related to procedure. Treat if needed. Bladder training discussed.   Relevant Orders   Urine Culture       Return if symptoms worsen or fail to improve.  Glenys GORMAN Birk, MD 07/03/2024 9:33 AM

## 2024-07-05 LAB — URINE CULTURE: Organism ID, Bacteria: NO GROWTH

## 2024-07-16 ENCOUNTER — Telehealth: Payer: Self-pay | Admitting: Family Medicine

## 2024-07-16 NOTE — Telephone Encounter (Signed)
 Good morning,  Patient called us  today to ask about her test results. She wants to know if she has an infection. She also reported that she had bloody urine and wanted to further discuss with our clinical staff.   Thanks, [employer]

## 2024-07-16 NOTE — Telephone Encounter (Signed)
 Returned pt call with [facility] [phone].  Advised pt of no growth on urine culture results.  Pt stated that a few days ago she thought there was small amount of blood in urine, it has since stopped an she has no other urinary complaints.  Discussed possibility of vaginal blood instead of blood from bladder.  Pt verbalized understanding and had no other concerns at this time.   [place], RN

## 2024-09-17 ENCOUNTER — Ambulatory Visit: Payer: Self-pay | Admitting: Nurse Practitioner

## 2024-09-17 ENCOUNTER — Telehealth: Payer: Self-pay | Admitting: Nurse Practitioner

## 2024-09-17 NOTE — Telephone Encounter (Signed)
 Contacted pt sch appt

## 2024-10-09 ENCOUNTER — Ambulatory Visit: Payer: Self-pay | Admitting: Nurse Practitioner
